# Patient Record
Sex: Male | Born: 1955
Health system: Southern US, Community
[De-identification: ages and names within clinical notes are randomized; demographics above are authoritative.]

## PROBLEM LIST (undated history)

## (undated) DIAGNOSIS — M1990 Unspecified osteoarthritis, unspecified site: Secondary | ICD-10-CM

## (undated) DIAGNOSIS — T59811A Toxic effect of smoke, accidental (unintentional), initial encounter: Secondary | ICD-10-CM

## (undated) DIAGNOSIS — I10 Essential (primary) hypertension: Secondary | ICD-10-CM

## (undated) HISTORY — DX: Essential (primary) hypertension: I10

## (undated) HISTORY — PX: TONSILLECTOMY: SUR1361

## (undated) HISTORY — DX: Hemochromatosis, unspecified: E83.119

## (undated) HISTORY — DX: Toxic effect of smoke, accidental (unintentional), initial encounter: T59.811A

## (undated) HISTORY — PX: BACK SURGERY: SHX140

## (undated) HISTORY — PX: OTHER SURGICAL HISTORY: SHX169

---

## 1998-06-06 ENCOUNTER — Encounter: Payer: Self-pay | Admitting: Family Medicine

## 1998-06-06 ENCOUNTER — Ambulatory Visit (HOSPITAL_COMMUNITY): Admission: RE | Admit: 1998-06-06 | Discharge: 1998-06-06 | Payer: Self-pay | Admitting: Family Medicine

## 1998-07-03 ENCOUNTER — Encounter: Payer: Self-pay | Admitting: Family Medicine

## 1998-07-03 ENCOUNTER — Ambulatory Visit: Admission: RE | Admit: 1998-07-03 | Discharge: 1998-07-03 | Payer: Self-pay | Admitting: Family Medicine

## 2005-10-16 DIAGNOSIS — F329 Major depressive disorder, single episode, unspecified: Secondary | ICD-10-CM | POA: Insufficient documentation

## 2005-10-16 DIAGNOSIS — F32A Depression, unspecified: Secondary | ICD-10-CM | POA: Insufficient documentation

## 2006-03-01 DIAGNOSIS — G459 Transient cerebral ischemic attack, unspecified: Secondary | ICD-10-CM | POA: Insufficient documentation

## 2006-03-01 DIAGNOSIS — E785 Hyperlipidemia, unspecified: Secondary | ICD-10-CM | POA: Insufficient documentation

## 2006-03-01 DIAGNOSIS — F17200 Nicotine dependence, unspecified, uncomplicated: Secondary | ICD-10-CM | POA: Insufficient documentation

## 2010-09-08 DIAGNOSIS — F419 Anxiety disorder, unspecified: Secondary | ICD-10-CM | POA: Insufficient documentation

## 2010-09-08 DIAGNOSIS — R000 Tachycardia, unspecified: Secondary | ICD-10-CM | POA: Insufficient documentation

## 2010-09-18 DIAGNOSIS — IMO0002 Reserved for concepts with insufficient information to code with codable children: Secondary | ICD-10-CM | POA: Insufficient documentation

## 2010-11-18 ENCOUNTER — Encounter: Payer: Self-pay | Admitting: Oncology

## 2010-11-19 ENCOUNTER — Other Ambulatory Visit: Payer: Self-pay | Admitting: Oncology

## 2010-11-19 ENCOUNTER — Encounter (HOSPITAL_BASED_OUTPATIENT_CLINIC_OR_DEPARTMENT_OTHER): Payer: Medicare HMO | Admitting: Oncology

## 2010-11-19 DIAGNOSIS — F1010 Alcohol abuse, uncomplicated: Secondary | ICD-10-CM

## 2010-11-19 LAB — CBC WITH DIFFERENTIAL/PLATELET
BASO%: 0.3 % (ref 0.0–2.0)
Basophils Absolute: 0 10*3/uL (ref 0.0–0.1)
EOS%: 1.2 % (ref 0.0–7.0)
Eosinophils Absolute: 0.1 10*3/uL (ref 0.0–0.5)
HCT: 42 % (ref 38.4–49.9)
HGB: 14.5 g/dL (ref 13.0–17.1)
LYMPH%: 20.2 % (ref 14.0–49.0)
MCH: 33.8 pg — ABNORMAL HIGH (ref 27.2–33.4)
MCHC: 34.6 g/dL (ref 32.0–36.0)
MCV: 97.6 fL (ref 79.3–98.0)
MONO#: 0.7 10*3/uL (ref 0.1–0.9)
MONO%: 8.7 % (ref 0.0–14.0)
NEUT#: 5.5 10*3/uL (ref 1.5–6.5)
NEUT%: 69.6 % (ref 39.0–75.0)
Platelets: 278 10*3/uL (ref 140–400)
RBC: 4.3 10*6/uL (ref 4.20–5.82)
RDW: 13.3 % (ref 11.0–14.6)
WBC: 8 10*3/uL (ref 4.0–10.3)
lymph#: 1.6 10*3/uL (ref 0.9–3.3)

## 2010-11-20 LAB — COMPREHENSIVE METABOLIC PANEL
AST: 28 U/L (ref 0–37)
Albumin: 4.5 g/dL (ref 3.5–5.2)
Alkaline Phosphatase: 53 U/L (ref 39–117)
BUN: 13 mg/dL (ref 6–23)
Potassium: 4.8 mEq/L (ref 3.5–5.3)

## 2010-11-20 LAB — IRON AND TIBC
%SAT: 30 % (ref 20–55)
TIBC: 345 ug/dL (ref 215–435)
UIBC: 242 ug/dL (ref 125–400)

## 2010-11-20 LAB — ERYTHROPOIETIN: Erythropoietin: 8.6 m[IU]/mL (ref 2.6–34.0)

## 2011-02-28 ENCOUNTER — Telehealth: Payer: Self-pay | Admitting: Oncology

## 2011-02-28 NOTE — Telephone Encounter (Signed)
lmonvm for pt re appts for 2/1 and 2/8. Feb schedule mailed today.

## 2011-03-30 ENCOUNTER — Encounter: Payer: Self-pay | Admitting: *Deleted

## 2011-04-03 ENCOUNTER — Other Ambulatory Visit: Payer: Medicare HMO

## 2011-04-10 ENCOUNTER — Ambulatory Visit: Payer: Medicare HMO | Admitting: Oncology

## 2011-11-17 DIAGNOSIS — G4730 Sleep apnea, unspecified: Secondary | ICD-10-CM | POA: Insufficient documentation

## 2011-12-22 DIAGNOSIS — R519 Headache, unspecified: Secondary | ICD-10-CM | POA: Insufficient documentation

## 2013-05-14 DIAGNOSIS — M549 Dorsalgia, unspecified: Secondary | ICD-10-CM

## 2013-05-14 DIAGNOSIS — I10 Essential (primary) hypertension: Secondary | ICD-10-CM | POA: Insufficient documentation

## 2013-05-14 DIAGNOSIS — G8929 Other chronic pain: Secondary | ICD-10-CM | POA: Insufficient documentation

## 2014-02-02 DIAGNOSIS — M79605 Pain in left leg: Secondary | ICD-10-CM | POA: Insufficient documentation

## 2014-02-02 DIAGNOSIS — Z8612 Personal history of poliomyelitis: Secondary | ICD-10-CM | POA: Insufficient documentation

## 2014-02-04 DIAGNOSIS — M62561 Muscle wasting and atrophy, not elsewhere classified, right lower leg: Secondary | ICD-10-CM | POA: Insufficient documentation

## 2016-10-07 ENCOUNTER — Other Ambulatory Visit: Payer: Self-pay | Admitting: Physician Assistant

## 2016-10-08 ENCOUNTER — Other Ambulatory Visit: Payer: Self-pay | Admitting: Physician Assistant

## 2016-10-08 DIAGNOSIS — M5416 Radiculopathy, lumbar region: Secondary | ICD-10-CM

## 2016-10-15 ENCOUNTER — Ambulatory Visit
Admission: RE | Admit: 2016-10-15 | Discharge: 2016-10-15 | Disposition: A | Discharge status: Home / Self Care | Payer: Medicare HMO | Source: Ambulatory Visit | Attending: Physician Assistant | Admitting: Physician Assistant

## 2016-10-15 ENCOUNTER — Ambulatory Visit
Admission: RE | Admit: 2016-10-15 | Discharge: 2016-10-15 | Disposition: A | Discharge status: Home / Self Care | Payer: 59 | Source: Ambulatory Visit | Attending: Physician Assistant | Admitting: Physician Assistant

## 2016-10-15 DIAGNOSIS — M5416 Radiculopathy, lumbar region: Secondary | ICD-10-CM

## 2016-10-15 MED ORDER — GADOBENATE DIMEGLUMINE 529 MG/ML IV SOLN
18.0000 mL | Freq: Once | INTRAVENOUS | Status: AC | PRN
  Administered 2016-10-15: 18 mL via INTRAVENOUS

## 2016-10-22 ENCOUNTER — Other Ambulatory Visit: Payer: Self-pay | Admitting: Neurological Surgery

## 2016-11-23 NOTE — Pre-Procedure Instructions (Signed)
Victor Ramirez  11/23/2016      [facility] - [place], [place] - [address] AT [facility] [address] [place] [address] Phone: [phone] Fax: [phone]    Your procedure is scheduled on September 28  Report to [facility] Admitting at 0530 A.M.  Call this number if you have problems the morning of surgery:  [phone]   Remember:  Do not eat food or drink liquids after midnight.  Continue all other medications as directed by your physician except follow these medication instructions before surgery   Take these medicines the morning of surgery with A SIP OF WATER  cetirizine (ZYRTEC) fluticasone (FLONASE) HYDROcodone-acetaminophen (NORCO/VICODIN) if needed  7 days prior to surgery STOP taking any Aspirin, Aleve, Naproxen, Ibuprofen, Motrin, Advil, Goody's, BC's, all herbal medications, fish oil, and all vitamins    Do not wear jewelry  Do not wear lotions, powders, or cologne, or deoderant.  Men may shave face and neck.  Do not bring valuables to the hospital.  [facility] is not responsible for any belongings or valuables.  Contacts, dentures or bridgework may not be worn into surgery.  Leave your suitcase in the car.  After surgery it may be brought to your room.  For patients admitted to the hospital, discharge time will be determined by your treatment team.  Patients discharged the day of surgery will not be allowed to drive home.    Special instructions:   [place]- Preparing For Surgery  Before surgery, you can play an important role. Because skin is not sterile, your skin needs to be as free of germs as possible. You can reduce the number of germs on your skin by washing with CHG (chlorahexidine gluconate) Soap before surgery.  CHG is an antiseptic cleaner which kills germs and bonds with the skin to continue killing germs even after washing.  Please do not use if you have an allergy  to CHG or antibacterial soaps. If your skin becomes reddened/irritated stop using the CHG.  Do not shave (including legs and underarms) for at least 48 hours prior to first CHG shower. It is OK to shave your face.  Please follow these instructions carefully.   1. Shower the NIGHT BEFORE SURGERY and the MORNING OF SURGERY with CHG.   2. If you chose to wash your hair, wash your hair first as usual with your normal shampoo.  3. After you shampoo, rinse your hair and body thoroughly to remove the shampoo.  4. Use CHG as you would any other liquid soap. You can apply CHG directly to the skin and wash gently with a scrungie or a clean washcloth.   5. Apply the CHG Soap to your body ONLY FROM THE NECK DOWN.  Do not use on open wounds or open sores. Avoid contact with your eyes, ears, mouth and genitals (private parts). Wash genitals (private parts) with your normal soap.  6. Wash thoroughly, paying special attention to the area where your surgery will be performed.  7. Thoroughly rinse your body with warm water from the neck down.  8. DO NOT shower/wash with your normal soap after using and rinsing off the CHG Soap.  9. Pat yourself dry with a CLEAN TOWEL.   10. Wear CLEAN PAJAMAS   11. Place CLEAN SHEETS on your bed the night of your first shower and DO NOT SLEEP WITH PETS.  Day of Surgery: Do not apply any deodorants/lotions. Please wear clean clothes to the hospital/surgery center.      Please read over the following fact sheets that you were given.

## 2016-11-24 ENCOUNTER — Encounter (HOSPITAL_COMMUNITY)
Admission: RE | Admit: 2016-11-24 | Discharge: 2016-11-24 | Disposition: A | Discharge status: Home / Self Care | Payer: 59 | Source: Ambulatory Visit | Attending: Neurological Surgery | Admitting: Neurological Surgery

## 2016-11-24 ENCOUNTER — Encounter (HOSPITAL_COMMUNITY): Payer: Self-pay

## 2016-11-24 DIAGNOSIS — Z01812 Encounter for preprocedural laboratory examination: Secondary | ICD-10-CM | POA: Insufficient documentation

## 2016-11-24 DIAGNOSIS — I10 Essential (primary) hypertension: Secondary | ICD-10-CM | POA: Insufficient documentation

## 2016-11-24 DIAGNOSIS — M4726 Other spondylosis with radiculopathy, lumbar region: Secondary | ICD-10-CM | POA: Insufficient documentation

## 2016-11-24 DIAGNOSIS — Z0183 Encounter for blood typing: Secondary | ICD-10-CM

## 2016-11-24 DIAGNOSIS — Z01818 Encounter for other preprocedural examination: Secondary | ICD-10-CM | POA: Insufficient documentation

## 2016-11-24 LAB — BASIC METABOLIC PANEL
Anion gap: 7 (ref 5–15)
BUN: 13 mg/dL (ref 6–20)
CHLORIDE: 100 mmol/L — AB (ref 101–111)
CO2: 27 mmol/L (ref 22–32)
CREATININE: 0.75 mg/dL (ref 0.61–1.24)
Calcium: 9.5 mg/dL (ref 8.9–10.3)
GFR calc non Af Amer: >60 mL/min (ref >60)
GLUCOSE: 99 mg/dL (ref 65–99)
Potassium: 5 mmol/L (ref 3.5–5.1)
Sodium: 134 mmol/L — ABNORMAL LOW (ref 135–145)

## 2016-11-24 LAB — TYPE AND SCREEN
ABO/RH(D): B POS
Antibody Screen: NEGATIVE

## 2016-11-24 LAB — CBC
HCT: 44.5 % (ref 39.0–52.0)
Hemoglobin: 15.3 g/dL (ref 13.0–17.0)
MCH: 32.6 pg (ref 26.0–34.0)
MCHC: 34.4 g/dL (ref 30.0–36.0)
MCV: 94.9 fL (ref 78.0–100.0)
PLATELETS: 266 10*3/uL (ref 150–400)
RBC: 4.69 MIL/uL (ref 4.22–5.81)
RDW: 12.6 % (ref 11.5–15.5)
WBC: 8.6 10*3/uL (ref 4.0–10.5)

## 2016-11-24 LAB — ABO/RH: ABO/RH(D): B POS

## 2016-11-24 LAB — SURGICAL PCR SCREEN
MRSA, PCR: NEGATIVE
Staphylococcus aureus: NEGATIVE

## 2016-11-24 MED ORDER — CHLORHEXIDINE GLUCONATE CLOTH 2 % EX PADS: 6.0000 | MEDICATED_PAD | Freq: Once | CUTANEOUS | Status: DC

## 2016-11-24 NOTE — Progress Notes (Signed)
PCP - Horton Marshall Cardiologist - denies   EKG - 11/24/2016  Pt denies cardiac history. Reports that he had retinal bleed over 10 years ago and had possible stress test done. Pt reports all tests were normal that were done.  Patient denies shortness of breath, fever, cough and chest pain at PAT appointment   Patient verbalized understanding of instructions that were given to them at the PAT appointment. Patient was also instructed that they will need to review over the PAT instructions again at home before surgery.

## 2016-11-24 NOTE — Progress Notes (Signed)
   11/24/16 0830  OBSTRUCTIVE SLEEP APNEA  Have you ever been diagnosed with sleep apnea through a sleep study? No  Do you snore loudly (loud enough to be heard through closed doors)?  1  Do you often feel tired, fatigued, or sleepy during the daytime (such as falling asleep during driving or talking to someone)? 0  Has anyone observed you stop breathing during your sleep? 0  Do you have, or are you being treated for high blood pressure? 1  BMI more than 35 kg/m2? 0  Age > 50 (1-yes) 1  Neck circumference greater than:Male 16 inches or larger, Male 17inches or larger? 1  Male Gender (Yes=1) 1  Obstructive Sleep Apnea Score 5  Score 5 or greater  Results sent to PCP

## 2016-11-27 ENCOUNTER — Inpatient Hospital Stay (HOSPITAL_COMMUNITY)
Admission: RE | Admit: 2016-11-27 | Discharge: 2016-11-28 | DRG: 455 | Disposition: A | Discharge status: Home / Self Care | Payer: 59 | Source: Ambulatory Visit | Attending: Neurological Surgery | Admitting: Neurological Surgery

## 2016-11-27 ENCOUNTER — Inpatient Hospital Stay (HOSPITAL_COMMUNITY): Payer: 59 | Admitting: Certified Registered"

## 2016-11-27 ENCOUNTER — Encounter (HOSPITAL_COMMUNITY): Payer: Self-pay

## 2016-11-27 ENCOUNTER — Encounter (HOSPITAL_COMMUNITY)
Admission: RE | Disposition: A | Discharge status: Home / Self Care | Payer: Self-pay | Source: Ambulatory Visit | Attending: Neurological Surgery

## 2016-11-27 ENCOUNTER — Inpatient Hospital Stay (HOSPITAL_COMMUNITY): Payer: 59

## 2016-11-27 DIAGNOSIS — M4727 Other spondylosis with radiculopathy, lumbosacral region: Principal | ICD-10-CM | POA: Diagnosis present

## 2016-11-27 DIAGNOSIS — Z419 Encounter for procedure for purposes other than remedying health state, unspecified: Secondary | ICD-10-CM

## 2016-11-27 DIAGNOSIS — Z981 Arthrodesis status: Secondary | ICD-10-CM | POA: Diagnosis not present

## 2016-11-27 DIAGNOSIS — Z79899 Other long term (current) drug therapy: Secondary | ICD-10-CM

## 2016-11-27 DIAGNOSIS — M48061 Spinal stenosis, lumbar region without neurogenic claudication: Secondary | ICD-10-CM | POA: Diagnosis present

## 2016-11-27 DIAGNOSIS — Z8612 Personal history of poliomyelitis: Secondary | ICD-10-CM | POA: Diagnosis not present

## 2016-11-27 DIAGNOSIS — I10 Essential (primary) hypertension: Secondary | ICD-10-CM | POA: Diagnosis present

## 2016-11-27 DIAGNOSIS — Z7951 Long term (current) use of inhaled steroids: Secondary | ICD-10-CM

## 2016-11-27 DIAGNOSIS — Z87891 Personal history of nicotine dependence: Secondary | ICD-10-CM

## 2016-11-27 HISTORY — PX: APPLICATION OF ROBOTIC ASSISTANCE FOR SPINAL PROCEDURE: SHX6753

## 2016-11-27 HISTORY — PX: ANTERIOR LATERAL LUMBAR FUSION WITH PERCUTANEOUS SCREW 1 LEVEL: SHX5553

## 2016-11-27 SURGERY — ANTERIOR LATERAL LUMBAR FUSION WITH PERCUTANEOUS SCREW 1 LEVEL
Anesthesia: General | Site: Spine Lumbar | Laterality: Right

## 2016-11-27 MED ORDER — THROMBIN 20000 UNITS EX SOLR
CUTANEOUS | Status: AC
  Filled 2016-11-27: qty 20000

## 2016-11-27 MED ORDER — CEFAZOLIN SODIUM-DEXTROSE 1-4 GM/50ML-% IV SOLN
1.0000 g | Freq: Three times a day (TID) | INTRAVENOUS | Status: AC
  Administered 2016-11-27 – 2016-11-28 (×2): 1 g via INTRAVENOUS
  Filled 2016-11-27 (×2): qty 50

## 2016-11-27 MED ORDER — OXYCODONE HCL 5 MG PO TABS
5.0000 mg | ORAL_TABLET | ORAL | Status: DC | PRN
  Administered 2016-11-27 – 2016-11-28 (×5): 10 mg via ORAL
  Filled 2016-11-27 (×4): qty 2

## 2016-11-27 MED ORDER — SUCCINYLCHOLINE CHLORIDE 20 MG/ML IJ SOLN
INTRAMUSCULAR | Status: DC | PRN
  Administered 2016-11-27: 140 mg via INTRAVENOUS

## 2016-11-27 MED ORDER — MIDAZOLAM HCL 5 MG/5ML IJ SOLN
INTRAMUSCULAR | Status: DC | PRN
  Administered 2016-11-27: 2 mg via INTRAVENOUS

## 2016-11-27 MED ORDER — MENTHOL 3 MG MT LOZG: 1.0000 | LOZENGE | OROMUCOSAL | Status: DC | PRN

## 2016-11-27 MED ORDER — METHOCARBAMOL 500 MG PO TABS
ORAL_TABLET | ORAL | Status: AC
  Administered 2016-11-27: 750 mg via ORAL
  Filled 2016-11-27: qty 2

## 2016-11-27 MED ORDER — LIDOCAINE-EPINEPHRINE 2 %-1:100000 IJ SOLN
INTRAMUSCULAR | Status: DC | PRN
  Administered 2016-11-27: 12.5 mL
  Administered 2016-11-27: 2.5 mL

## 2016-11-27 MED ORDER — SODIUM CHLORIDE 0.9 % IV SOLN
INTRAVENOUS | Status: DC
  Administered 2016-11-27: 16:00:00 via INTRAVENOUS

## 2016-11-27 MED ORDER — CELECOXIB 200 MG PO CAPS
200.0000 mg | ORAL_CAPSULE | Freq: Two times a day (BID) | ORAL | Status: DC
  Administered 2016-11-27 – 2016-11-28 (×2): 200 mg via ORAL
  Filled 2016-11-27 (×2): qty 1

## 2016-11-27 MED ORDER — METHYLPREDNISOLONE ACETATE 80 MG/ML IJ SUSP
INTRAMUSCULAR | Status: AC
  Filled 2016-11-27: qty 1

## 2016-11-27 MED ORDER — PANTOPRAZOLE SODIUM 40 MG IV SOLR
40.0000 mg | Freq: Every day | INTRAVENOUS | Status: DC
  Administered 2016-11-27: 40 mg via INTRAVENOUS
  Filled 2016-11-27: qty 40

## 2016-11-27 MED ORDER — GABAPENTIN 300 MG PO CAPS
300.0000 mg | ORAL_CAPSULE | Freq: Three times a day (TID) | ORAL | Status: DC
  Administered 2016-11-27 – 2016-11-28 (×3): 300 mg via ORAL
  Filled 2016-11-27 (×3): qty 1

## 2016-11-27 MED ORDER — CEFAZOLIN SODIUM 1 G IJ SOLR
INTRAMUSCULAR | Status: AC
  Filled 2016-11-27: qty 20

## 2016-11-27 MED ORDER — PHENYLEPHRINE 40 MCG/ML (10ML) SYRINGE FOR IV PUSH (FOR BLOOD PRESSURE SUPPORT)
PREFILLED_SYRINGE | INTRAVENOUS | Status: AC
  Filled 2016-11-27: qty 10

## 2016-11-27 MED ORDER — PROPOFOL 10 MG/ML IV BOLUS
INTRAVENOUS | Status: AC
  Filled 2016-11-27: qty 20

## 2016-11-27 MED ORDER — 0.9 % SODIUM CHLORIDE (POUR BTL) OPTIME
TOPICAL | Status: DC | PRN
  Administered 2016-11-27: 1000 mL

## 2016-11-27 MED ORDER — BUPIVACAINE HCL (PF) 0.5 % IJ SOLN
INTRAMUSCULAR | Status: AC
  Filled 2016-11-27: qty 30

## 2016-11-27 MED ORDER — SODIUM CHLORIDE 0.9% FLUSH
3.0000 mL | Freq: Two times a day (BID) | INTRAVENOUS | Status: DC
  Administered 2016-11-27: 3 mL via INTRAVENOUS

## 2016-11-27 MED ORDER — PHENYLEPHRINE HCL 10 MG/ML IJ SOLN
INTRAMUSCULAR | Status: DC | PRN
  Administered 2016-11-27: 160 ug via INTRAVENOUS

## 2016-11-27 MED ORDER — LIDOCAINE-EPINEPHRINE (PF) 2 %-1:200000 IJ SOLN
INTRAMUSCULAR | Status: AC
  Filled 2016-11-27: qty 20

## 2016-11-27 MED ORDER — VANCOMYCIN HCL 1000 MG IV SOLR
INTRAVENOUS | Status: DC | PRN
  Administered 2016-11-27: 1000 mg via TOPICAL

## 2016-11-27 MED ORDER — KETOROLAC TROMETHAMINE 30 MG/ML IJ SOLN
INTRAMUSCULAR | Status: AC
  Filled 2016-11-27: qty 1

## 2016-11-27 MED ORDER — VANCOMYCIN HCL 1000 MG IV SOLR
INTRAVENOUS | Status: AC
  Filled 2016-11-27: qty 1000

## 2016-11-27 MED ORDER — ONDANSETRON HCL 4 MG PO TABS: 4.0000 mg | ORAL_TABLET | Freq: Four times a day (QID) | ORAL | Status: DC | PRN

## 2016-11-27 MED ORDER — THROMBIN 20000 UNITS EX SOLR
CUTANEOUS | Status: DC | PRN
  Administered 2016-11-27: 20 mL via TOPICAL

## 2016-11-27 MED ORDER — SODIUM CHLORIDE 0.9% FLUSH: 3.0000 mL | INTRAVENOUS | Status: DC | PRN

## 2016-11-27 MED ORDER — CEFAZOLIN SODIUM-DEXTROSE 2-4 GM/100ML-% IV SOLN
2.0000 g | INTRAVENOUS | Status: AC
  Administered 2016-11-27 (×2): 2 g via INTRAVENOUS
  Filled 2016-11-27: qty 100

## 2016-11-27 MED ORDER — ONDANSETRON HCL 4 MG/2ML IJ SOLN
4.0000 mg | Freq: Once | INTRAMUSCULAR | Status: AC | PRN
  Administered 2016-11-27: 4 mg via INTRAVENOUS

## 2016-11-27 MED ORDER — MIDAZOLAM HCL 2 MG/2ML IJ SOLN
INTRAMUSCULAR | Status: AC
  Filled 2016-11-27: qty 2

## 2016-11-27 MED ORDER — PHENYLEPHRINE HCL 10 MG/ML IJ SOLN
INTRAMUSCULAR | Status: DC | PRN
  Administered 2016-11-27: 30 ug/min via INTRAVENOUS

## 2016-11-27 MED ORDER — ZOLPIDEM TARTRATE 5 MG PO TABS: 5.0000 mg | ORAL_TABLET | Freq: Every evening | ORAL | Status: DC | PRN

## 2016-11-27 MED ORDER — ACETAMINOPHEN 500 MG PO TABS
1000.0000 mg | ORAL_TABLET | Freq: Four times a day (QID) | ORAL | Status: DC
  Administered 2016-11-27 – 2016-11-28 (×3): 1000 mg via ORAL
  Filled 2016-11-27 (×3): qty 2

## 2016-11-27 MED ORDER — ONDANSETRON HCL 4 MG/2ML IJ SOLN: 4.0000 mg | Freq: Four times a day (QID) | INTRAMUSCULAR | Status: DC | PRN

## 2016-11-27 MED ORDER — METHOCARBAMOL 750 MG PO TABS
750.0000 mg | ORAL_TABLET | Freq: Four times a day (QID) | ORAL | Status: DC
  Administered 2016-11-27 – 2016-11-28 (×3): 750 mg via ORAL
  Filled 2016-11-27 (×2): qty 1

## 2016-11-27 MED ORDER — ONDANSETRON HCL 4 MG/2ML IJ SOLN
INTRAMUSCULAR | Status: DC | PRN
  Administered 2016-11-27: 4 mg via INTRAVENOUS

## 2016-11-27 MED ORDER — LORATADINE 10 MG PO TABS
10.0000 mg | ORAL_TABLET | Freq: Every day | ORAL | Status: DC
  Administered 2016-11-28: 10 mg via ORAL
  Filled 2016-11-27: qty 1

## 2016-11-27 MED ORDER — SODIUM CHLORIDE 0.9 % IJ SOLN
INTRAMUSCULAR | Status: AC
  Filled 2016-11-27: qty 20

## 2016-11-27 MED ORDER — BUPIVACAINE LIPOSOME 1.3 % IJ SUSP
20.0000 mL | Freq: Once | INTRAMUSCULAR | Status: DC
  Filled 2016-11-27: qty 20

## 2016-11-27 MED ORDER — PROPOFOL 10 MG/ML IV BOLUS
INTRAVENOUS | Status: DC | PRN
  Administered 2016-11-27 (×2): 70 mg via INTRAVENOUS
  Administered 2016-11-27 (×2): 130 mg via INTRAVENOUS

## 2016-11-27 MED ORDER — METHYLPREDNISOLONE ACETATE 80 MG/ML IJ SUSP
INTRAMUSCULAR | Status: DC | PRN
  Administered 2016-11-27: 1 mL

## 2016-11-27 MED ORDER — FENTANYL CITRATE (PF) 100 MCG/2ML IJ SOLN
INTRAMUSCULAR | Status: DC | PRN
  Administered 2016-11-27 (×2): 50 ug via INTRAVENOUS
  Administered 2016-11-27: 100 ug via INTRAVENOUS
  Administered 2016-11-27: 150 ug via INTRAVENOUS
  Administered 2016-11-27 (×4): 100 ug via INTRAVENOUS

## 2016-11-27 MED ORDER — SODIUM CHLORIDE 0.9 % IR SOLN
Status: DC | PRN
  Administered 2016-11-27: 10:00:00

## 2016-11-27 MED ORDER — BUPIVACAINE-EPINEPHRINE 0.5% -1:200000 IJ SOLN
INTRAMUSCULAR | Status: DC | PRN
  Administered 2016-11-27: 2.5 mL
  Administered 2016-11-27: 12.5 mL

## 2016-11-27 MED ORDER — LIDOCAINE HCL (CARDIAC) 20 MG/ML IV SOLN
INTRAVENOUS | Status: DC | PRN
  Administered 2016-11-27: 100 mg via INTRAVENOUS

## 2016-11-27 MED ORDER — SUGAMMADEX SODIUM 200 MG/2ML IV SOLN
INTRAVENOUS | Status: DC | PRN
  Administered 2016-11-27: 178.6 mg via INTRAVENOUS

## 2016-11-27 MED ORDER — ROCURONIUM BROMIDE 10 MG/ML (PF) SYRINGE
PREFILLED_SYRINGE | INTRAVENOUS | Status: AC
  Filled 2016-11-27: qty 5

## 2016-11-27 MED ORDER — KETOROLAC TROMETHAMINE 30 MG/ML IJ SOLN
INTRAMUSCULAR | Status: DC | PRN
  Administered 2016-11-27: 30 mg via INTRA_ARTICULAR

## 2016-11-27 MED ORDER — HYDROXYZINE HCL 50 MG/ML IM SOLN
50.0000 mg | INTRAMUSCULAR | Status: DC | PRN
  Administered 2016-11-27: 50 mg via INTRAMUSCULAR
  Filled 2016-11-27: qty 1

## 2016-11-27 MED ORDER — FENTANYL CITRATE (PF) 250 MCG/5ML IJ SOLN
INTRAMUSCULAR | Status: AC
  Filled 2016-11-27: qty 5

## 2016-11-27 MED ORDER — OXYCODONE HCL ER 20 MG PO T12A
20.0000 mg | EXTENDED_RELEASE_TABLET | Freq: Two times a day (BID) | ORAL | Status: DC
  Administered 2016-11-27 – 2016-11-28 (×3): 20 mg via ORAL
  Filled 2016-11-27 (×3): qty 1

## 2016-11-27 MED ORDER — DIAZEPAM 5 MG PO TABS
ORAL_TABLET | ORAL | Status: AC
  Filled 2016-11-27: qty 1

## 2016-11-27 MED ORDER — BISACODYL 5 MG PO TBEC: 5.0000 mg | DELAYED_RELEASE_TABLET | Freq: Every day | ORAL | Status: DC | PRN

## 2016-11-27 MED ORDER — FLEET ENEMA 7-19 GM/118ML RE ENEM: 1.0000 | ENEMA | Freq: Once | RECTAL | Status: DC | PRN

## 2016-11-27 MED ORDER — OXYCODONE HCL 5 MG PO TABS
ORAL_TABLET | ORAL | Status: AC
Start: 2016-11-27 — End: 2016-11-28
  Filled 2016-11-27: qty 2

## 2016-11-27 MED ORDER — FLUTICASONE PROPIONATE 50 MCG/ACT NA SUSP
2.0000 | Freq: Every day | NASAL | Status: DC
  Filled 2016-11-27: qty 16

## 2016-11-27 MED ORDER — HYDROMORPHONE HCL 1 MG/ML IJ SOLN
0.2500 mg | INTRAMUSCULAR | Status: DC | PRN
  Administered 2016-11-27 (×2): 0.5 mg via INTRAVENOUS

## 2016-11-27 MED ORDER — ONDANSETRON HCL 4 MG/2ML IJ SOLN
INTRAMUSCULAR | Status: AC
  Filled 2016-11-27: qty 2

## 2016-11-27 MED ORDER — ROCURONIUM BROMIDE 100 MG/10ML IV SOLN
INTRAVENOUS | Status: DC | PRN
  Administered 2016-11-27: 100 mg via INTRAVENOUS

## 2016-11-27 MED ORDER — DOCUSATE SODIUM 100 MG PO CAPS
100.0000 mg | ORAL_CAPSULE | Freq: Two times a day (BID) | ORAL | Status: DC
  Administered 2016-11-27 – 2016-11-28 (×2): 100 mg via ORAL
  Filled 2016-11-27 (×2): qty 1

## 2016-11-27 MED ORDER — THROMBIN 5000 UNITS EX SOLR
OROMUCOSAL | Status: DC | PRN
  Administered 2016-11-27: 10:00:00 via TOPICAL

## 2016-11-27 MED ORDER — MEPERIDINE HCL 25 MG/ML IJ SOLN: 6.2500 mg | INTRAMUSCULAR | Status: DC | PRN

## 2016-11-27 MED ORDER — LACTATED RINGERS IV SOLN
INTRAVENOUS | Status: DC
  Administered 2016-11-27 (×2): via INTRAVENOUS

## 2016-11-27 MED ORDER — LIDOCAINE 2% (20 MG/ML) 5 ML SYRINGE
INTRAMUSCULAR | Status: AC
  Filled 2016-11-27: qty 5

## 2016-11-27 MED ORDER — BUPIVACAINE-EPINEPHRINE (PF) 0.5% -1:200000 IJ SOLN
INTRAMUSCULAR | Status: AC
  Filled 2016-11-27: qty 30

## 2016-11-27 MED ORDER — HYDROMORPHONE HCL 1 MG/ML IJ SOLN
INTRAMUSCULAR | Status: AC
  Administered 2016-11-27: 0.5 mg via INTRAVENOUS
  Filled 2016-11-27: qty 1

## 2016-11-27 MED ORDER — SODIUM CHLORIDE 0.9 % IV SOLN
INTRAVENOUS | Status: DC | PRN
  Administered 2016-11-27: 30 mL

## 2016-11-27 MED ORDER — PHENOL 1.4 % MT LIQD: 1.0000 | OROMUCOSAL | Status: DC | PRN

## 2016-11-27 MED ORDER — BUPIVACAINE HCL (PF) 0.5 % IJ SOLN
INTRAMUSCULAR | Status: DC | PRN
  Administered 2016-11-27: 1 mL
  Administered 2016-11-27: 2 mL

## 2016-11-27 MED ORDER — ONDANSETRON HCL 4 MG/2ML IJ SOLN
INTRAMUSCULAR | Status: AC
  Administered 2016-11-27: 4 mg via INTRAVENOUS
  Filled 2016-11-27: qty 2

## 2016-11-27 MED ORDER — DIAZEPAM 5 MG PO TABS
5.0000 mg | ORAL_TABLET | Freq: Four times a day (QID) | ORAL | Status: DC | PRN
  Administered 2016-11-27 (×2): 5 mg via ORAL
  Filled 2016-11-27: qty 1

## 2016-11-27 MED ORDER — THROMBIN 5000 UNITS EX SOLR
CUTANEOUS | Status: AC
  Filled 2016-11-27: qty 5000

## 2016-11-27 MED ORDER — SENNA 8.6 MG PO TABS
1.0000 | ORAL_TABLET | Freq: Two times a day (BID) | ORAL | Status: DC
  Administered 2016-11-27 – 2016-11-28 (×2): 8.6 mg via ORAL
  Filled 2016-11-27 (×2): qty 1

## 2016-11-27 MED ORDER — LISINOPRIL 20 MG PO TABS
40.0000 mg | ORAL_TABLET | Freq: Every day | ORAL | Status: DC
  Administered 2016-11-27 – 2016-11-28 (×2): 40 mg via ORAL
  Filled 2016-11-27 (×2): qty 2

## 2016-11-27 MED ORDER — SUGAMMADEX SODIUM 200 MG/2ML IV SOLN
INTRAVENOUS | Status: AC
  Filled 2016-11-27: qty 2

## 2016-11-27 SURGICAL SUPPLY — 83 items
ADH SKN CLS APL DERMABOND .7 (GAUZE/BANDAGES/DRESSINGS) ×6
BATTALION LLIF ITRADISCAL SHIM (MISCELLANEOUS) ×3
BIT DRILL LONG 3.0X30 (BIT) IMPLANT
BIT DRILL LONG 3X80 (BIT) IMPLANT
BIT DRILL LONG 4X80 (BIT) IMPLANT
BIT DRILL SHORT 3.0X30 (BIT) IMPLANT
BIT DRILL SHORT 3X80 (BIT) IMPLANT
BLADE SURG 11 STRL SS (BLADE) ×3 IMPLANT
CABLE BATTALION LLIF LIGHT (MISCELLANEOUS) ×1 IMPLANT
CHLORAPREP W/TINT 26ML (MISCELLANEOUS) ×3 IMPLANT
DERMABOND ADVANCED (GAUZE/BANDAGES/DRESSINGS) ×3
DERMABOND ADVANCED .7 DNX12 (GAUZE/BANDAGES/DRESSINGS) ×4 IMPLANT
DILATOR INSULATED XL 8X13 (MISCELLANEOUS) ×1 IMPLANT
DISSECTOR BLUNT TIP ENDO 5MM (MISCELLANEOUS) IMPLANT
DRAPE C-ARM 42X72 X-RAY (DRAPES) ×6 IMPLANT
DRAPE C-ARMOR (DRAPES) ×3 IMPLANT
DRAPE LAPAROTOMY 100X72X124 (DRAPES) ×3 IMPLANT
DRAPE POUCH INSTRU U-SHP 10X18 (DRAPES) ×3 IMPLANT
DRAPE SHEET LG 3/4 BI-LAMINATE (DRAPES) ×3 IMPLANT
DRSG OPSITE POSTOP 4X6 (GAUZE/BANDAGES/DRESSINGS) ×3 IMPLANT
ELECT BLADE 4.0 EZ CLEAN MEGAD (MISCELLANEOUS) ×3
ELECT COATED BLADE 2.86 ST (ELECTRODE) ×3 IMPLANT
ELECT REM PT RETURN 9FT ADLT (ELECTROSURGICAL) ×3
ELECTRODE BLDE 4.0 EZ CLN MEGD (MISCELLANEOUS) IMPLANT
ELECTRODE REM PT RTRN 9FT ADLT (ELECTROSURGICAL) ×2 IMPLANT
FEE INTRAOP MONITOR IMPULS NCS (MISCELLANEOUS) IMPLANT
GAUZE SPONGE 4X4 16PLY XRAY LF (GAUZE/BANDAGES/DRESSINGS) IMPLANT
GLOVE BIO SURGEON STRL SZ7 (GLOVE) IMPLANT
GLOVE BIOGEL PI IND STRL 7.0 (GLOVE) IMPLANT
GLOVE BIOGEL PI IND STRL 7.5 (GLOVE) ×2 IMPLANT
GLOVE BIOGEL PI INDICATOR 7.0 (GLOVE) ×2
GLOVE BIOGEL PI INDICATOR 7.5 (GLOVE) ×3
GLOVE EXAM NITRILE LRG STRL (GLOVE) IMPLANT
GLOVE EXAM NITRILE XL STR (GLOVE) IMPLANT
GLOVE EXAM NITRILE XS STR PU (GLOVE) IMPLANT
GLOVE SS BIOGEL STRL SZ 7.5 (GLOVE) ×2 IMPLANT
GLOVE SUPERSENSE BIOGEL SZ 7.5 (GLOVE) ×1
GOWN STRL REUS W/ TWL LRG LVL3 (GOWN DISPOSABLE) ×2 IMPLANT
GOWN STRL REUS W/ TWL XL LVL3 (GOWN DISPOSABLE) ×2 IMPLANT
GOWN STRL REUS W/TWL 2XL LVL3 (GOWN DISPOSABLE) ×1 IMPLANT
GOWN STRL REUS W/TWL LRG LVL3 (GOWN DISPOSABLE) ×3
GOWN STRL REUS W/TWL XL LVL3 (GOWN DISPOSABLE) ×3
GUIDEWIRE 320MM BLUNT TIP (WIRE) ×1 IMPLANT
HEMOSTAT POWDER KIT SURGIFOAM (HEMOSTASIS) IMPLANT
INTRAOP MONITOR FEE IMPULS NCS (MISCELLANEOUS) ×2
INTRAOP MONITOR FEE IMPULSE (MISCELLANEOUS) ×1
KIT BASIN OR (CUSTOM PROCEDURE TRAY) ×3 IMPLANT
KIT INFUSE XX SMALL 0.7CC (Orthopedic Implant) ×1 IMPLANT
KIT ROOM TURNOVER OR (KITS) ×3 IMPLANT
KIT SPINE MAZOR X ROBO DISP (MISCELLANEOUS) ×3 IMPLANT
LEAD WIRE MULTI STAGE DISP (NEUROSURGERY SUPPLIES) ×1 IMPLANT
NDL HYPO 21X1.5 SAFETY (NEEDLE) ×2 IMPLANT
NDL HYPO 25X1 1.5 SAFETY (NEEDLE) ×2 IMPLANT
NEEDLE HYPO 21X1.5 SAFETY (NEEDLE) ×6 IMPLANT
NEEDLE HYPO 25X1 1.5 SAFETY (NEEDLE) ×6 IMPLANT
NS IRRIG 1000ML POUR BTL (IV SOLUTION) ×3 IMPLANT
PACK LAMINECTOMY NEURO (CUSTOM PROCEDURE TRAY) ×3 IMPLANT
PIN HEAD 2.5X60MM (PIN) IMPLANT
PROBE BALL TIP STIM 200MM 2.3M (NEUROSURGERY SUPPLIES) ×2 IMPLANT
PUTTY DBM GRAFTON 5CC (Putty) ×1 IMPLANT
ROD TI PRECONT ILLICO 5.5X5 (Rod) ×2 IMPLANT
SCREW CANN PA ILLICO 6.5X50 (Screw) ×2 IMPLANT
SCREW CANN PA ILLICO 6.5X55 (Screw) ×2 IMPLANT
SCREW SCHANZ SA 4.0MM (MISCELLANEOUS) IMPLANT
SCREW SET SPINAL STD HEXALOBE (Screw) ×4 IMPLANT
SHIM ITRADISCAL BATTALION LLIF (MISCELLANEOUS) IMPLANT
SPACER BATTALION 22X10X55M 10 (Spacer) ×1 IMPLANT
SPONGE LAP 4X18 X RAY DECT (DISPOSABLE) IMPLANT
SPONGE SURGIFOAM ABS GEL 100 (HEMOSTASIS) ×1 IMPLANT
STAPLER VISISTAT 35W (STAPLE) ×3 IMPLANT
SUT STRATAFIX MNCRL+ 3-0 PS-2 (SUTURE) ×2
SUT STRATAFIX MONOCRYL 3-0 (SUTURE) ×1
SUT VIC AB 1 CT1 18XBRD ANBCTR (SUTURE) ×2 IMPLANT
SUT VIC AB 1 CT1 8-18 (SUTURE) ×3
SUT VIC AB 2-0 CT1 18 (SUTURE) ×3 IMPLANT
SUT VIC AB 3-0 SH 8-18 (SUTURE) ×2 IMPLANT
SUTURE STRATFX MNCRL+ 3-0 PS-2 (SUTURE) IMPLANT
SYR 30ML LL (SYRINGE) ×4 IMPLANT
TIP TROCAR NITINOL ILLICO 20 (INSTRUMENTS) ×4 IMPLANT
TOWEL GREEN STERILE (TOWEL DISPOSABLE) ×3 IMPLANT
TOWEL GREEN STERILE FF (TOWEL DISPOSABLE) ×3 IMPLANT
TRAY FOLEY W/METER SILVER 16FR (SET/KITS/TRAYS/PACK) ×3 IMPLANT
WATER STERILE IRR 1000ML POUR (IV SOLUTION) ×3 IMPLANT

## 2016-11-27 NOTE — Evaluation (Signed)
Physical Therapy Evaluation Patient Details Name: Victor Ramirez MRN: [id number] DOB: May 17, 1955 Today's Date: 11/27/2016   History of Present Illness  Pt is a 61 y/o male s/p L3-4 ALIF. PMH includes HTN, back surgery, and hemachromatosis.   Clinical Impression  Patient is s/p above surgery resulting in the deficits listed below (see PT Problem List). PTA, pt was independent with mobility, however, did not walk much secondary to pain. Upon eval, pt very limited by pain. Required min A for balance and very limited distance secondary to pain. Discussed use of RW at d/c and will need to practice during next session. Will need DME below and will have assist from wife at d/c. Patient will benefit from skilled PT to increase their independence and safety with mobility (while adhering to their precautions) to allow discharge to the venue listed below.     Follow Up Recommendations No PT follow up;Supervision/Assistance - 24 hour    Equipment Recommendations  Rolling walker with 5" wheels;3in1 (PT)    Recommendations for Other Services       Precautions / Restrictions Precautions Precautions: Back Precaution Booklet Issued: Yes (comment) Precaution Comments: Reviewed back precaution handout with pt.  Required Braces or Orthoses: Spinal Brace Spinal Brace: Lumbar corset;Applied in sitting position Restrictions Weight Bearing Restrictions: No      Mobility  Bed Mobility Overal bed mobility: Needs Assistance Bed Mobility: Sit to Sidelying;Rolling Rolling: Supervision       Sit to sidelying: Supervision General bed mobility comments: Supervision to ensure log roll tehcnique.   Transfers Overall transfer level: Needs assistance Equipment used: 1 person hand held assist Transfers: Sit to/from Stand Sit to Stand: Min assist         General transfer comment: Min A for steadying assist secondary to LOB upon standing.   Ambulation/Gait Ambulation/Gait assistance: Min assist;Min  guard Ambulation Distance (Feet): 75 Feet Assistive device: 1 person hand held assist (IV pole ) Gait Pattern/deviations: Step-through pattern;Decreased stride length;Shuffle;Trunk flexed Gait velocity: Decreased Gait velocity interpretation: Below normal speed for age/gender General Gait Details: Slow, very guarded gait. Pt requiring use of BUE support to maintain balance. Distance limited by increased pain. Required verbal cues for breathing as pt wanted to hold his breath.   Stairs            Wheelchair Mobility    Modified Rankin (Stroke Patients Only)       Balance Overall balance assessment: Needs assistance Sitting-balance support: No upper extremity supported;Feet supported Sitting balance-Leahy Scale: Good     Standing balance support: Bilateral upper extremity supported;During functional activity Standing balance-Leahy Scale: Poor Standing balance comment: Reliant on BUE support for balance                              Pertinent Vitals/Pain Pain Assessment: 0-10 Pain Score: 8  Pain Location: back  Pain Descriptors / Indicators: Cramping;Spasm Pain Intervention(s): Limited activity within patient's tolerance;Monitored during session;Repositioned    Home Living Family/patient expects to be discharged to:: Private residence Living Arrangements: Spouse/significant other;Children Available Help at Discharge: Family;Available 24 hours/day Type of Home: House Home Access: Stairs to enter Entrance Stairs-Rails: Right Entrance Stairs-Number of Steps: 3 Home Layout: Two level;Able to live on main level with bedroom/bathroom Home Equipment: None      Prior Function Level of Independence: Independent         Comments: Reports he was independent, but didn't walk much  Hand Dominance        Extremity/Trunk Assessment   Upper Extremity Assessment Upper Extremity Assessment: Defer to OT evaluation    Lower Extremity Assessment Lower  Extremity Assessment: Generalized weakness    Cervical / Trunk Assessment Cervical / Trunk Assessment: Other exceptions Cervical / Trunk Exceptions: s/p ALIF  Communication   Communication: No difficulties  Cognition Arousal/Alertness: Awake/alert Behavior During Therapy: WFL for tasks assessed/performed Overall Cognitive Status: Within Functional Limits for tasks assessed                                        General Comments General comments (skin integrity, edema, etc.): Pt's wife present. Educated about generalized walking program to perform at home     Exercises     Assessment/Plan    PT Assessment Patient needs continued PT services  PT Problem List Decreased strength;Decreased activity tolerance;Decreased mobility;Decreased balance;Decreased knowledge of use of DME;Decreased knowledge of precautions;Pain       PT Treatment Interventions DME instruction;Gait training;Stair training;Functional mobility training;Therapeutic activities;Therapeutic exercise;Neuromuscular re-education;Balance training;Patient/family education    PT Goals (Current goals can be found in the Care Plan section)  Acute Rehab PT Goals Patient Stated Goal: to go home  PT Goal Formulation: With patient Time For Goal Achievement: 12/11/16 Potential to Achieve Goals: Good    Frequency Min 5X/week   Barriers to discharge        Co-evaluation               AM-PAC PT "6 Clicks" Daily Activity  Outcome Measure Difficulty turning over in bed (including adjusting bedclothes, sheets and blankets)?: None Difficulty moving from lying on back to sitting on the side of the bed? : None Difficulty sitting down on and standing up from a chair with arms (e.g., wheelchair, bedside commode, etc,.)?: Unable Help needed moving to and from a bed to chair (including a wheelchair)?: A Little Help needed walking in hospital room?: A Little Help needed climbing 3-5 steps with a railing? : A  Lot 6 Click Score: 17    End of Session Equipment Utilized During Treatment: Gait belt;Back brace Activity Tolerance: Patient limited by pain Patient left: in bed;with call bell/phone within reach;with family/visitor present Nurse Communication: Mobility status PT Visit Diagnosis: Other abnormalities of gait and mobility (R26.89);Pain;Unsteadiness on feet (R26.81) Pain - part of body:  (back )    Time: 1610-9604 PT Time Calculation (min) (ACUTE ONLY): 19 min   Charges:   PT Evaluation $PT Eval Low Complexity: 1 Low     PT G Codes:        Gladys Damme, PT, DPT  Acute Rehabilitation Services  Pager: [phone]   Lehman Prom 11/27/2016, 6:07 PM

## 2016-11-27 NOTE — Anesthesia Preprocedure Evaluation (Signed)
Anesthesia Evaluation  Patient identified by MRN, date of birth, ID band Patient awake    Reviewed: Allergy & Precautions, NPO status , Patient's Chart, lab work & pertinent test results  Airway Mallampati: I  TM Distance: >3 FB Neck ROM: Full    Dental   Pulmonary former smoker,    Pulmonary exam normal        Cardiovascular hypertension, Pt. on medications Normal cardiovascular exam     Neuro/Psych    GI/Hepatic   Endo/Other    Renal/GU      Musculoskeletal   Abdominal   Peds  Hematology   Anesthesia Other Findings   Reproductive/Obstetrics                             Anesthesia Physical Anesthesia Plan  ASA: II  Anesthesia Plan: General   Post-op Pain Management:    Induction: Intravenous  PONV Risk Score and Plan: 2 and Ondansetron and Dexamethasone  Airway Management Planned: Oral ETT  Additional Equipment:   Intra-op Plan:   Post-operative Plan: Extubation in OR  Informed Consent: I have reviewed the patients History and Physical, chart, labs and discussed the procedure including the risks, benefits and alternatives for the proposed anesthesia with the patient or authorized representative who has indicated his/her understanding and acceptance.     Plan Discussed with: CRNA and Surgeon  Anesthesia Plan Comments:         Anesthesia Quick Evaluation

## 2016-11-27 NOTE — Op Note (Signed)
11/27/2016  1:18 PM  PATIENT:  Victor Ramirez  61 y.o. male  PRE-OPERATIVE DIAGNOSIS:  Lumbosacral spondylosis with radiculopathy; lumbar stenosis L3-4; Degeneration adjacent to a prior fusion  POST-OPERATIVE DIAGNOSIS:  Same  PROCEDURE:  L3-4 anterior lumbar interbody fusion via transpsoas approach; bilateral L3-4 decompression utilizing MetRx tube; L3-4 posterior non-segmental instrumented fusion; Use of BMP; microsurgical technique requiring use of operating microscope  SURGEON:  Hulan Saas, MD  ASSISTANTS: Cindra Presume, PA-C  ANESTHESIA:   General  DRAINS: None   SPECIMEN:  None  INDICATION FOR PROCEDURE: 61 year old male with lumbar radiculopathy due to adjacent segment degeneration.  I recommended the above operation.  The patient understood the risks, benefits, and alternatives and potential outcomes and wished to proceed.  PROCEDURE DETAILS: The patient was brought to the operating room.  After smooth induction of general endotracheal anesthesia the patient was placed in the lateral position with the right side up and secured with tape.  Localizing views were taken with fluoroscopy.  The operative site was then prepped and draped in the usual sterile fashion.  The planned incisions were infiltrated with lidocaine with epinephrine.   The skin was sharply incised and soft tissue to the level of the oblique abdominal muscles was carried out with monopolar cautery.  I then bluntly dissected through the oblique muscles with care taken to preserve any nerves.  I encountered encountered transversalis fascia which was bluntly entered and this opened into an easily dissected fat plane consistent with the retroperitoneum.  I inserted the dilator and approached the posterior third of the L3-4 disc space.  I confirmed that I was not in contact with any nerves of the lumbar plexus. A K-wire was passed into the disc space.  The tract was sequentially dilated and then the retractor was  introduced.  I incised the disc space and then passed a Cobb elevator along each endplate and penetrated the annulus on the contralateral side.  I then used box cutters, a paddle, and a rasp to complete the discectomy.  A trial spacer was used to determine appropriate graft size and then a PEEK lordotic interbody graft packed with BMP and grafton was inserted.  The retractor was removed after hemostasis was confirmed.   The incision wwas closed in layers with interrupted vicryl sutures.  The skin was closed with running monocryl stratafix suture and dermabond.  The patient was then turned prone on an open [place] table.  The patient was again prepped and draped in usual sterile fashion. A Schanz pin was inserted in the right iliac crest. The MazorX surgical robot was connected to the patient and registered to the patient's anatomy unit using fluoroscopy. The robot was used to plan the incisions. 2 vertical incisions were made after infiltrating with lidocaine and Marcaine with epinephrine. Exparel was injected along the planned screw tracts. The Mazor X robot was used as a drill guide to cannulate the pedicles at L3 and L4 bilaterally. K wires were placed into each pedicle and their position was confirmed with fluoroscopy. I then tapped over the K wires. I placed screws on the right side. I removed the left-sided K wires.  I sequentially dilated to track to the inferior edge of the L3 lamina on the left. Using a Metrx tube I was able to visualize the normal bony landmarks.  Subperiosteal dissection of the residual muscle was performed using monopolar cautery. The microscope was prepped and draped and brought into the operative field to provide light  and magnification. Using microsurgical technique I extended the lamina of L3. I elevated and resected the ligamentum flavum. I undercut the opposite side is lamina at L3. I continued to resect hypertrophied ligament. I identified the traversing L4 nerve root on  each side. I also undercut the superior edge of the lamina of L4 bilaterally. I was satisfied that these were decompressed.  There was excellent hemostasis. I irrigated with bacitracin saline. I instilled a solution of Toradol, Marcaine, and Depo-Medrol in the epidural space. I placed Gelfoam over this. I then used the Metrix tube to expose the left L3-4 facet. I drilled this level with transverse process. I then placed graft on over this decorticated surface to perform a posterior lateral arthrodesis.  The Mazor X robot was used to replace the K wires on the left side. I then placed the screws over the K wires. Position of all screws was confirmed with AP and lateral fluoroscopy. I then passed rods and reduce the rods so that I could place set screws to secure the rods bilaterally. The set screws were tightened with a torque wrench. Adequate rod length was confirmed with AP and lateral fluoroscopy.   The wounds were irrigated with bacitracin saline.  Vancomycin powder was placed within the wound. The wound was closed in routine anatomic layers using interrupted Vicryl sutures. The skin was closed with a Monocryl strata fix suture on each side. It was sealed with Dermabond. Sterile dressings were applied.   PATIENT DISPOSITION:  PACU - hemodynamically stable.   Delay start of Pharmacological VTE agent (>24hrs) due to surgical blood loss or risk of bleeding:  yes

## 2016-11-27 NOTE — Anesthesia Postprocedure Evaluation (Signed)
Anesthesia Post Note  Patient: Victor Ramirez  Procedure(s) Performed: Procedure(s) (LRB): Lumbar Three-Four Transpsoas Lumbar InterbodyFfusion (Right) Lumbar Three-Four Pedicle Screw Fixation with Posterolateral Arthrodesis with Minimally Invasive Decompression with Application of Robotic Assistance (N/A)     Patient location during evaluation: PACU Anesthesia Type: General Level of consciousness: awake and alert Pain management: pain level controlled Vital Signs Assessment: post-procedure vital signs reviewed and stable Respiratory status: spontaneous breathing, nonlabored ventilation, respiratory function stable and patient connected to nasal cannula oxygen Cardiovascular status: blood pressure returned to baseline and stable Postop Assessment: no apparent nausea or vomiting Anesthetic complications: no    Last Vitals:  Vitals:   11/27/16 1355 11/27/16 1415  BP: (!) 139/95 (!) 145/93  Pulse: (!) 107 96  Resp: 12 15  Temp:    SpO2: 97% 98%    Last Pain:  Vitals:   11/27/16 1415  TempSrc:   PainSc: 7                  [name]

## 2016-11-27 NOTE — Transfer of Care (Signed)
Immediate Anesthesia Transfer of Care Note  Patient: Victor Ramirez  Procedure(s) Performed: Procedure(s) with comments: Lumbar Three-Four Transpsoas Lumbar InterbodyFfusion (Right) - L3-4 Transpsoas lumbar interbody fusion/L3-4 Pedicle screw fixation with posterolateral arthrodesis/minimally invasive decompression/Mazor Lumbar Three-Four Pedicle Screw Fixation with Posterolateral Arthrodesis with Minimally Invasive Decompression with Application of Robotic Assistance (N/A)  Patient Location: PACU  Anesthesia Type:General  Level of Consciousness: drowsy and patient cooperative  Airway & Oxygen Therapy: Patient Spontanous Breathing and Patient connected to face mask oxygen  Post-op Assessment: Report given to RN and Post -op Vital signs reviewed and stable  Post vital signs: Reviewed and stable  Last Vitals:  Vitals:   11/27/16 0647 11/27/16 1340  BP: (!) 160/108   Pulse: 81   Resp: 20   Temp: 36.9 C (!) 36.3 C  SpO2: 100%     Last Pain:  Vitals:   11/27/16 1340  TempSrc: Oral  PainSc:          Complications: No apparent anesthesia complications

## 2016-11-27 NOTE — H&P (Signed)
Chief Complaint   Lower back pain  HPI   HPI: Victor Ramirez is a 61 y.o. male who presents today for elective lumbar surgery. He has a long standing history of lower back pain with radiation down both legs. He has failed conservative treatment to date. He feels well this am and is ready for surgery. He denies any concerns.  There are no active problems to display for this patient.   PMH: Past Medical History:  Diagnosis Date  . Hemochromatosis   . Hypertension     PSH: Past Surgical History:  Procedure Laterality Date  . BACK SURGERY     x3   . BACK SURGERY     battery surgery   . spinal manipulation under anesthesia      Prescriptions Prior to Admission  Medication Sig Dispense Refill Last Dose  . cetirizine (ZYRTEC) 10 MG tablet Take 10 mg by mouth daily.   11/27/2016 at 0430  . fluticasone (FLONASE) 50 MCG/ACT nasal spray Place 2 sprays into both nostrils daily.   11/27/2016 at 0430  . HYDROcodone-acetaminophen (NORCO/VICODIN) 5-325 MG tablet Take 0.5 tablets by mouth 2 (two) times daily as needed for moderate pain.   0 11/27/2016 at 0430  . lisinopril (PRINIVIL,ZESTRIL) 40 MG tablet Take 40 mg by mouth daily.   11/26/2016 at Unknown time    SH: Social History  Substance Use Topics  . Smoking status: Former Smoker    Types: Cigarettes  . Smokeless tobacco: Never Used     Comment: quit over 10 years ago, 2000  . Alcohol use 6.0 oz/week    1 Shots of liquor, 9 Cans of beer per week    MEDS: Prior to Admission medications   Medication Sig Start Date End Date Taking? Authorizing Provider  cetirizine (ZYRTEC) 10 MG tablet Take 10 mg by mouth daily.   Yes [provider]  fluticasone (FLONASE) 50 MCG/ACT nasal spray Place 2 sprays into both nostrils daily.   Yes [provider]  HYDROcodone-acetaminophen (NORCO/VICODIN) 5-325 MG tablet Take 0.5 tablets by mouth 2 (two) times daily as needed for moderate pain.  10/28/16  Yes [provider]   lisinopril (PRINIVIL,ZESTRIL) 40 MG tablet Take 40 mg by mouth daily. 08/15/16  Yes [provider]    ALLERGY: No Known Allergies  Social History  Substance Use Topics  . Smoking status: Former Smoker    Types: Cigarettes  . Smokeless tobacco: Never Used     Comment: quit over 10 years ago, 2000  . Alcohol use 6.0 oz/week    1 Shots of liquor, 9 Cans of beer per week     History reviewed. No pertinent family history.   ROS   Review of Systems  Constitutional: Negative.   HENT: Negative.   Eyes: Negative.   Respiratory: Negative.   Cardiovascular: Negative.   Gastrointestinal: Negative.   Genitourinary: Negative.   Musculoskeletal: Positive for back pain, joint pain and myalgias.  Skin: Negative.   Neurological: Positive for tingling. Negative for dizziness, tremors, sensory change, speech change, focal weakness, seizures, loss of consciousness and headaches.  Endo/Heme/Allergies: Negative.     Exam   Vitals:   11/27/16 0647  BP: (!) 160/108  Pulse: 81  Resp: 20  Temp: 98.4 F (36.9 C)  SpO2: 100%   General appearance: WDWN, NAD, resting comfortably Eyes: PERRL, Fundoscopic: normal Cardiovascular: Regular rate and rhythm without murmurs, rubs, gallops. No edema or variciosities. Distal pulses normal. Pulmonary: Clear to auscultation Musculoskeletal:  Muscle tone upper extremities: Normal    Muscle tone lower extremities: Normal    Motor exam: Upper Extremities Deltoid Bicep Tricep Grip  Right 5/5 5/5 5/5 5/5  Left 5/5 5/5 5/5 5/5   Lower Extremity IP Quad PF DF EHL  Right 4/5 4/5 4/5 4/5 4/5  Left 5/5 5/5 5/5 5/5 5/5  Note: chronic weakness RLE due to polio as a child  Neurological Awake, alert, oriented Memory and concentration grossly intact Speech fluent, appropriate CNII: Visual fields normal CNIII/IV/VI: EOMI CNV: Facial sensation normal CNVII: Symmetric, normal strength CNVIII: Grossly normal CNIX: Normal palate movement CNXI:  Trap and SCM strength normal CN XII: Tongue protrusion normal Sensation grossly intact to LT DTR: Normal Coordination (finger/nose & heel/shin): Normal  Results - Imaging/Labs   No results found for this or any previous visit (from the past 48 hour(s)).  No results found.  Impression/Plan   61 y.o. male with lumbar stenosis secondary to large disc bulge at L3-4. I have recommended L3-4 anterior interbody fusion via transpsoas approach, L3-4 decompression, and posterior instrumented fusion.  He and I have reviewed the risks, benefits, and alternatives to surgery.  He wishes to proceed.

## 2016-11-27 NOTE — Anesthesia Procedure Notes (Signed)
Procedure Name: Intubation Date/Time: 11/27/2016 9:19 AM Performed by: Lance Coon Pre-anesthesia Checklist: Patient identified, Emergency Drugs available, Suction available, Patient being monitored and Timeout performed Patient Re-evaluated:Patient Re-evaluated prior to induction Oxygen Delivery Method: Circle system utilized Preoxygenation: Pre-oxygenation with 100% oxygen Induction Type: IV induction Ventilation: Mask ventilation without difficulty Laryngoscope Size: Mac and 4 Grade View: Grade II Tube type: Oral Tube size: 7.5 mm Number of attempts: 1 Airway Equipment and Method: Stylet Placement Confirmation: positive ETCO2,  breath sounds checked- equal and bilateral and ETT inserted through vocal cords under direct vision Secured at: 22 cm Tube secured with: Tape Dental Injury: Teeth and Oropharynx as per pre-operative assessment

## 2016-11-28 LAB — CBC
HEMATOCRIT: 37 % — AB (ref 39.0–52.0)
HEMOGLOBIN: 12.7 g/dL — AB (ref 13.0–17.0)
MCH: 32.4 pg (ref 26.0–34.0)
MCHC: 34.3 g/dL (ref 30.0–36.0)
MCV: 94.4 fL (ref 78.0–100.0)
Platelets: 214 10*3/uL (ref 150–400)
RBC: 3.92 MIL/uL — ABNORMAL LOW (ref 4.22–5.81)
RDW: 12.5 % (ref 11.5–15.5)
WBC: 15.3 10*3/uL — ABNORMAL HIGH (ref 4.0–10.5)

## 2016-11-28 LAB — BASIC METABOLIC PANEL
Anion gap: 7 (ref 5–15)
BUN: 7 mg/dL (ref 6–20)
CHLORIDE: 100 mmol/L — AB (ref 101–111)
CO2: 28 mmol/L (ref 22–32)
CREATININE: 0.74 mg/dL (ref 0.61–1.24)
Calcium: 8.9 mg/dL (ref 8.9–10.3)
GFR calc non Af Amer: >60 mL/min (ref >60)
Glucose, Bld: 158 mg/dL — ABNORMAL HIGH (ref 65–99)
Potassium: 4.4 mmol/L (ref 3.5–5.1)
Sodium: 135 mmol/L (ref 135–145)

## 2016-11-28 MED ORDER — GABAPENTIN 300 MG PO CAPS: 300.0000 mg | ORAL_CAPSULE | Freq: Three times a day (TID) | ORAL | 2 refills | Status: AC

## 2016-11-28 MED ORDER — METHOCARBAMOL 750 MG PO TABS: 750.0000 mg | ORAL_TABLET | Freq: Four times a day (QID) | ORAL | 2 refills | Status: DC

## 2016-11-28 MED ORDER — HYDROCODONE-ACETAMINOPHEN 5-325 MG PO TABS: 1.0000 | ORAL_TABLET | ORAL | 0 refills | Status: DC | PRN

## 2016-11-28 NOTE — Progress Notes (Signed)
Physical Therapy Treatment Patient Details Name: Victor Ramirez MRN: [id number] DOB: Jul 13, 1955 Today's Date: 11/28/2016    History of Present Illness Pt is a 61 y/o male s/p L3-4 ALIF. PMH includes HTN, back surgery, and hemachromatosis.     PT Comments    Pt initially had deferred PT visit stating he was doing well with mobility and had no questions or concerns.  However after speaking with RN, she feels pt really needs to participate in physical therapy before discharging home.  Spoke to pt again and he was then agreeable to ambulate and complete stair training.  Ambulated ~120' with no AD with supervision, and ascended/descended 4 steps using 1 rail with min guard.  Discussed with pt about using RW for increased distances to help offload weight from LE's & back and conserve energy.  Pt states he is agreeable to doing so.  Educated on importance of ambulating at least every 1-1.5 hrs & to start with shorter distances more frequently & build on that to improve strength.  Patient safe to D/C from a mobility standpoint based on progression towards goals set on PT eval.     Follow Up Recommendations        Equipment Recommendations       Recommendations for Other Services       Precautions / Restrictions Precautions Precautions: Back Precaution Comments: patient able to recall 3/3 back precautions Required Braces or Orthoses: Spinal Brace Spinal Brace: Lumbar corset;Applied in sitting position Restrictions Weight Bearing Restrictions: No    Mobility  Bed Mobility Overal bed mobility: Needs Assistance Bed Mobility: Sit to Sidelying;Rolling;Sidelying to Sit Rolling: Supervision Sidelying to sit: Supervision     Sit to sidelying: Supervision General bed mobility comments: Supervision to ensure log roll tehcnique. Bed positioned to simulated home with [facility] and bed rails lowered  Transfers Overall transfer level: Needs assistance Equipment used: Rolling walker (2  wheeled);None Transfers: Sit to/from Stand Sit to Stand: Supervision         General transfer comment: cues for hand placement  Ambulation/Gait Ambulation/Gait assistance: Supervision Ambulation Distance (Feet): 120 Feet Assistive device: None Gait Pattern/deviations: Step-through pattern;Wide base of support     General Gait Details: supervision for safety   Stairs Stairs: Yes   Stair Management: One rail Right;Forwards Number of Stairs: 4 General stair comments: cues for sequencing  Wheelchair Mobility    Modified Rankin (Stroke Patients Only)       Balance Overall balance assessment: Needs assistance Sitting-balance support: No upper extremity supported;Feet supported Sitting balance-Leahy Scale: Good     Standing balance support: During functional activity;No upper extremity supported Standing balance-Leahy Scale: Fair Standing balance comment: Able to maintain standing balance after assistance to steady intitally with Min A                            Cognition Arousal/Alertness: Awake/alert Behavior During Therapy: WFL for tasks assessed/performed Overall Cognitive Status: Within Functional Limits for tasks assessed                                 General Comments: mildly impulsive       Exercises      General Comments General comments (skin integrity, edema, etc.): wife present entire visit      Pertinent Vitals/Pain Pain Assessment: 0-10 Pain Score: 3  Faces Pain Scale: Hurts little more Pain Location: back  Pain  Descriptors / Indicators: Aching Pain Intervention(s): Monitored during session;Premedicated before session;Repositioned    Home Living Family/patient expects to be discharged to:: Private residence Living Arrangements: Spouse/significant other;Children Available Help at Discharge: Family;Available 24 hours/day Type of Home: House Home Access: Stairs to enter Entrance Stairs-Rails: Right Home Layout:  Two level;Able to live on main level with bedroom/bathroom Home Equipment: None      Prior Function Level of Independence: Independent      Comments: ADLs, IADLs, working as a [employer] architect   PT Goals (current goals can now be found in the care plan section) Acute Rehab PT Goals Patient Stated Goal: to go home  PT Goal Formulation: With patient Time For Goal Achievement: 12/11/16 Potential to Achieve Goals: Good    Frequency           PT Plan      Co-evaluation              AM-PAC PT "6 Clicks" Daily Activity  Outcome Measure                   End of Session               Time: 1610-9604 PT Time Calculation (min) (ACUTE ONLY): 15 min  Charges:  $Gait Training: 8-22 mins                    G CodesVerdell Face, [place] [phone] 11/28/2016

## 2016-11-28 NOTE — Care Management Note (Signed)
Case Management Note  Patient Details  Name: Victor Ramirez MRN: [id number] Date of Birth: September 22, 1955  Subjective/Objective:                 Patient with order to DC to home today. Chart reviewed. No Home Health or Equipment needs, no unacknowledged Case Management consults or medication needs identified at the time of this note. Bedside RN will provide DME to patient as needed. Plan for DC to home. If needs arise today prior to discharge, please call Lawerance Sabal RN CM at [phone].    Action/Plan:  DC home self care.  Expected Discharge Date:  11/28/16               Expected Discharge Plan:  Home/Self Care  In-House Referral:     Discharge planning Services  CM Consult  Post Acute Care Choice:    Choice offered to:     DME Arranged:    DME Agency:     HH Arranged:    HH Agency:     Status of Service:  Completed, signed off  If discussed at [employer] of Stay Meetings, dates discussed:    Additional Comments:  Lawerance Sabal, RN 11/28/2016, 9:24 AM

## 2016-11-28 NOTE — Evaluation (Signed)
Occupational Therapy Evaluation Patient Details Name: Victor Ramirez MRN: [id number] DOB: 10-23-1955 Today's Date: 11/28/2016    History of Present Illness Pt is a 61 y/o male s/p L3-4 ALIF. PMH includes HTN, back surgery, and hemachromatosis.    Clinical Impression   PTA, pt was living with his wife and was independent and working. Pt currently requiring Min Guard A for LB ADLs with AE and Min Guard for functional mobility. Provided education on back precautions, LB ADLs, toilet transfer, and tub transfer with 3N1. Provided all education and answered pt's questions in preparation for dc later today. Recommend dc home with 24 hour supervision/assistance once medically stable per physician.     Follow Up Recommendations  No OT follow up;Supervision/Assistance - 24 hour    Equipment Recommendations  3 in 1 bedside commode;Other (comment) (AE)    Recommendations for Other Services       Precautions / Restrictions Precautions Precautions: Back Precaution Comments: Reviewed back precautions with pt. At end of session, pt able to recall 3/3 precautions. Required Braces or Orthoses: Spinal Brace Spinal Brace: Lumbar corset;Applied in sitting position Restrictions Weight Bearing Restrictions: No      Mobility Bed Mobility Overal bed mobility: Needs Assistance Bed Mobility: Sit to Sidelying;Rolling;Sidelying to Sit Rolling: Supervision Sidelying to sit: Supervision     Sit to sidelying: Supervision General bed mobility comments: Supervision to ensure log roll tehcnique. Bed positioned to simulated home with HOB and bed rails lowered  Transfers Overall transfer level: Needs assistance Equipment used: 1 person hand held assist Transfers: Sit to/from Stand Sit to Stand: Min assist         General transfer comment: Min A to steady in standing    Balance Overall balance assessment: Needs assistance Sitting-balance support: No upper extremity supported;Feet  supported Sitting balance-Leahy Scale: Good     Standing balance support: During functional activity;No upper extremity supported Standing balance-Leahy Scale: Fair Standing balance comment: Able to maintain standing balance after assistance to steady intitally with Min A                           ADL either performed or assessed with clinical judgement   ADL Overall ADL's : Needs assistance/impaired Eating/Feeding: Set up;Sitting   Grooming: Min guard;Standing   Upper Body Bathing: Set up;Supervision/ safety;Sitting   Lower Body Bathing: With caregiver independent assisting;Sit to/from stand;Min guard   Upper Body Dressing : Set up;Supervision/safety;Sitting Upper Body Dressing Details (indicate cue type and reason): donned shirt in sitting Lower Body Dressing: Min guard;With adaptive equipment;Sit to/from stand;Cueing for compensatory techniques Lower Body Dressing Details (indicate cue type and reason): Able to bring ankle to knee for donning/doffing socks and shoes. Pt donned underwear and pants with AE and Min Guard A for standing balance and safety Toilet Transfer: Min guard;Ambulation Toilet Transfer Details (indicate cue type and reason): Min Guard for safety and simulated in the room   Toileting - Clothing Manipulation Details (indicate cue type and reason): Educated on adhering to back precautions during toielt hyigene after BM Tub/ Shower Transfer: Tub transfer;Minimal assistance;Ambulation Tub/Shower Transfer Details (indicate cue type and reason): Simulated tub transfer. Pt performed with Min A for safety Functional mobility during ADLs: Min guard General ADL Comments: Provided education on LB ADLs, tub transfer, bed mobility, toilet transfer, and managing back brace. Pt wife present throughout session.      Vision         Perception  Praxis      Pertinent Vitals/Pain Pain Assessment: Faces Faces Pain Scale: Hurts little more Pain Location: back   Pain Descriptors / Indicators: Constant;Discomfort Pain Intervention(s): Monitored during session;Repositioned     Hand Dominance Right   Extremity/Trunk Assessment Upper Extremity Assessment Upper Extremity Assessment: Overall WFL for tasks assessed   Lower Extremity Assessment Lower Extremity Assessment: Defer to PT evaluation   Cervical / Trunk Assessment Cervical / Trunk Assessment: Other exceptions Cervical / Trunk Exceptions: s/p ALIF   Communication Communication Communication: No difficulties   Cognition Arousal/Alertness: Awake/alert Behavior During Therapy: WFL for tasks assessed/performed Overall Cognitive Status: Within Functional Limits for tasks assessed                                 General Comments: Little impulsive. suspected due to medication   General Comments  Wife present throughout session    Exercises     Shoulder Instructions      Home Living Family/patient expects to be discharged to:: Private residence Living Arrangements: Spouse/significant other;Children Available Help at Discharge: Family;Available 24 hours/day Type of Home: House Home Access: Stairs to enter [employer] of Steps: 3 Entrance Stairs-Rails: Right Home Layout: Two level;Able to live on main level with bedroom/bathroom     Bathroom Shower/Tub: Chief Strategy Officer: Standard     Home Equipment: None          Prior Functioning/Environment Level of Independence: Independent        Comments: ADLs, IADLs, working as a Product/process development scientist Problem List: Decreased range of motion;Decreased activity tolerance;Impaired balance (sitting and/or standing);Decreased knowledge of use of DME or AE;Decreased knowledge of precautions;Pain      OT Treatment/Interventions:      OT Goals(Current goals can be found in the care plan section) Acute Rehab OT Goals Patient Stated Goal: to go home  OT Goal Formulation: With  patient Time For Goal Achievement: 12/12/16 Potential to Achieve Goals: Good  OT Frequency:     Barriers to D/C:            Co-evaluation              AM-PAC PT "6 Clicks" Daily Activity     Outcome Measure Help from another person eating meals?: None Help from another person taking care of personal grooming?: A Little Help from another person toileting, which includes using toliet, bedpan, or urinal?: A Little Help from another person bathing (including washing, rinsing, drying)?: A Little Help from another person to put on and taking off regular upper body clothing?: A Little Help from another person to put on and taking off regular lower body clothing?: A Little 6 Click Score: 19   End of Session Equipment Utilized During Treatment: Rolling walker;Back brace Nurse Communication: Mobility status  Activity Tolerance: Patient tolerated treatment well Patient left: with call bell/phone within reach;with family/visitor present (At [employer] eating breakfast)  OT Visit Diagnosis: Unsteadiness on feet (R26.81);Other abnormalities of gait and mobility (R26.89);Pain Pain - Right/Left:  (Back) Pain - part of body:  (Back)                Time: 1610-9604 OT Time Calculation (min): 21 min Charges:  OT General Charges $OT Visit: 1 Visit OT Evaluation $OT Eval Low Complexity: 1 Low G-Codes:     Teiana Hajduk MSOT, OTR/L Acute Rehab Pager: [phone] Office: [phone]  Luan Moore  M Atthew Coutant 11/28/2016, 9:16 AM

## 2016-11-28 NOTE — Progress Notes (Signed)
Patient alert and oriented, mae's well, voiding adequate amount of urine, swallowing without difficulty,  C/o mild pain at time of discharge. Patient discharged home with family. Script and discharged instructions given to patient. Patient and family stated understanding of instructions given. Patient has an appointment with Dr. Bevely Palmer

## 2016-11-28 NOTE — Progress Notes (Signed)
PT Cancellation Note  Patient Details Name: Victor Ramirez MRN: [id number] DOB: 01-03-1956   Cancelled Treatment:     Pt deferring PT session at this time.  Per chart review, is discharging today.  Pt says he ambulated in hallway with Nsing using RW and feels he is doing well.  States he has no questions or concerns.     Verdell Face, PTA  11/28/2016

## 2016-11-28 NOTE — Discharge Summary (Signed)
Physician Discharge Summary  Patient ID: Victor Ramirez MRN: [id number] DOB/AGE: Apr 23, 1955 61 y.o.  Admit date: 11/27/2016 Discharge date: 11/28/2016  Admission Diagnoses:  Lumbosacral spondylosis with radiculopathy  Discharge Diagnoses:  Same Active Problems:   Lumbosacral spondylosis with radiculopathy  Discharged Condition: Stable  Hospital Course:  Victor Ramirez is a 61 y.o. male who was admitted for the below procedure. There were no post operative complications. At time of discharge, pain was well controlled, ambulating with Pt/OT, tolerating po, voiding normal. Ready for discharge.  Treatments: Surgery L3-4 anterior lumbar interbody fusion via transpsoas approach; bilateral L3-4 decompression utilizing MetRx tube; L3-4 posterior non-segmental instrumented fusion; Use of BMP; microsurgical technique requiring use of operating microscope  Discharge Exam: Blood pressure (!) 158/89, pulse 68, temperature 98.1 F (36.7 C), temperature source Oral, resp. rate 18, height  (1.727 m), weight 88.9 kg (196 lb), SpO2 100 %. Awake, alert, oriented Speech fluent, appropriate CN grossly intact MAEW with good strength Wound c/d/i  Disposition: Final discharge disposition not confirmed  Discharge Instructions    Call MD for:  difficulty breathing, headache or visual disturbances    Complete by:  As directed    Call MD for:  persistant dizziness or light-headedness    Complete by:  As directed    Call MD for:  redness, tenderness, or signs of infection (pain, swelling, redness, odor or green/yellow discharge around incision site)    Complete by:  As directed    Call MD for:  severe uncontrolled pain    Complete by:  As directed    Call MD for:  temperature >100.4    Complete by:  As directed    Diet general    Complete by:  As directed    Driving Restrictions    Complete by:  As directed    Do not drive until given clearance.   Increase activity slowly    Complete by:   As directed    Lifting restrictions    Complete by:  As directed    Do not lift anything >10lbs. Avoid bending and twisting in awkward positions. Avoid bending at the back.   May shower / Bathe    Complete by:  As directed    In 24 hours. Okay to wash wound with warm soapy water. Avoid scrubbing the wound. Pat dry.   Remove dressing in 24 hours    Complete by:  As directed      Allergies as of 11/28/2016   No Known Allergies     Medication List    TAKE these medications   cetirizine 10 MG tablet Commonly known as:  ZYRTEC Take 10 mg by mouth daily.   fluticasone 50 MCG/ACT nasal spray Commonly known as:  FLONASE Place 2 sprays into both nostrils daily.   gabapentin 300 MG capsule Commonly known as:  NEURONTIN Take 1 capsule (300 mg total) by mouth 3 (three) times daily.   HYDROcodone-acetaminophen 5-325 MG tablet Commonly known as:  NORCO/VICODIN Take 1-2 tablets by mouth every 4 (four) hours as needed for moderate pain. What changed:  how much to take  when to take this   lisinopril 40 MG tablet Commonly known as:  PRINIVIL,ZESTRIL Take 40 mg by mouth daily.   methocarbamol 750 MG tablet Commonly known as:  ROBAXIN Take 1 tablet (750 mg total) by mouth 4 (four) times daily.            Durable Medical Equipment  Start     Ordered   11/27/16 1802  For home use only DME 3 n 1  Once     11/27/16 1801   11/27/16 1802  For home use only DME Walker rolling  Once    Question:  Patient needs a walker to treat with the following condition  Answer:  S/P lumbar spinal fusion   11/27/16 1801       Discharge Care Instructions        Start     Ordered   11/28/16 0000  gabapentin (NEURONTIN) 300 MG capsule  3 times daily     11/28/16 0809   11/28/16 0000  HYDROcodone-acetaminophen (NORCO/VICODIN) 5-325 MG tablet  Every 4 hours PRN     11/28/16 0809   11/28/16 0000  methocarbamol (ROBAXIN) 750 MG tablet  4 times daily     11/28/16 0809   11/28/16  0000  Increase activity slowly     11/28/16 0809   11/28/16 0000  May shower / Bathe    Comments:  In 24 hours. Okay to wash wound with warm soapy water. Avoid scrubbing the wound. Pat dry.   11/28/16 0809   11/28/16 0000  Driving Restrictions    Comments:  Do not drive until given clearance.   11/28/16 0809   11/28/16 0000  Lifting restrictions    Comments:  Do not lift anything >10lbs. Avoid bending and twisting in awkward positions. Avoid bending at the back.   11/28/16 0809   11/28/16 0000  Diet general     11/28/16 0809   11/28/16 0000  Remove dressing in 24 hours     11/28/16 0809   11/28/16 0000  Call MD for:  temperature >100.4     11/28/16 0809   11/28/16 0000  Call MD for:  severe uncontrolled pain     11/28/16 0809   11/28/16 0000  Call MD for:  redness, tenderness, or signs of infection (pain, swelling, redness, odor or green/yellow discharge around incision site)     11/28/16 0809   11/28/16 0000  Call MD for:  difficulty breathing, headache or visual disturbances     11/28/16 0809   11/28/16 0000  Call MD for:  persistant dizziness or light-headedness     11/28/16 0809       Signed: Alyson Ingles 11/28/2016, 8:09 AM

## 2016-11-28 NOTE — Progress Notes (Signed)
No acute events Doing great Very happy Moving legs well Bandages c/d/i D/c home

## 2016-11-29 ENCOUNTER — Encounter (HOSPITAL_COMMUNITY): Payer: Self-pay | Admitting: Neurological Surgery

## 2016-11-30 MED FILL — Heparin Sodium (Porcine) Inj 1000 Unit/ML: INTRAMUSCULAR | Qty: 30 | Status: AC

## 2016-11-30 MED FILL — Sodium Chloride IV Soln 0.9%: INTRAVENOUS | Qty: 1000 | Status: AC

## 2017-02-16 ENCOUNTER — Other Ambulatory Visit: Payer: Self-pay | Admitting: Neurological Surgery

## 2017-02-16 DIAGNOSIS — M545 Low back pain, unspecified: Secondary | ICD-10-CM

## 2017-02-17 ENCOUNTER — Ambulatory Visit
Admission: RE | Admit: 2017-02-17 | Discharge: 2017-02-17 | Disposition: A | Discharge status: Home / Self Care | Payer: 59 | Source: Ambulatory Visit | Attending: Neurological Surgery | Admitting: Neurological Surgery

## 2017-02-17 DIAGNOSIS — M545 Low back pain, unspecified: Secondary | ICD-10-CM

## 2018-03-18 ENCOUNTER — Other Ambulatory Visit: Payer: Self-pay | Admitting: Family Medicine

## 2018-03-18 DIAGNOSIS — M25551 Pain in right hip: Secondary | ICD-10-CM

## 2018-03-24 ENCOUNTER — Other Ambulatory Visit (HOSPITAL_COMMUNITY): Payer: Self-pay

## 2018-03-24 ENCOUNTER — Encounter (HOSPITAL_COMMUNITY): Payer: Self-pay | Admitting: *Deleted

## 2018-03-24 ENCOUNTER — Emergency Department (HOSPITAL_COMMUNITY)
Admission: EM | Admit: 2018-03-24 | Discharge: 2018-03-24 | Disposition: A | Discharge status: Home / Self Care | Payer: 59 | Attending: Emergency Medicine | Admitting: Emergency Medicine

## 2018-03-24 DIAGNOSIS — Z87891 Personal history of nicotine dependence: Secondary | ICD-10-CM | POA: Insufficient documentation

## 2018-03-24 DIAGNOSIS — I10 Essential (primary) hypertension: Secondary | ICD-10-CM | POA: Insufficient documentation

## 2018-03-24 DIAGNOSIS — M25551 Pain in right hip: Secondary | ICD-10-CM | POA: Insufficient documentation

## 2018-03-24 DIAGNOSIS — Z79899 Other long term (current) drug therapy: Secondary | ICD-10-CM | POA: Insufficient documentation

## 2018-03-24 NOTE — ED Notes (Signed)
Xray states they can do the MRI , pt s friend then ask for pain meds and if it was open or closed , I states I had no idea and had he spoken to his dr about pain meds , pt d/c and shown to radiology all the while stating it is going to be out of pocket , pt states has pain meds at home

## 2018-03-24 NOTE — ED Triage Notes (Addendum)
Pt is here for right hip pain that has been getting worse.  Pt felt it first one month ago and became suddenly worse Monday before last.  Pt was seen by PCP and told him he should get an MRI and he is waiting for approval from insurance. Pt states that he can't take it anymore and decided to get help in ER.  This hip pain is not associated with any neuro deficits, he states that it is a burning pain. No hx of trauma to hip

## 2018-03-24 NOTE — ED Provider Notes (Signed)
[facility] EMERGENCY DEPARTMENT Provider Note   CSN: [id number] Arrival date & time: 03/24/18  1118   History   Chief Complaint Chief Complaint  Patient presents with  . Hip Pain    HPI Victor Ramirez is a 63 y.o. male.  HPI   63 year old male presents today with complaints of right hip pain.  Patient notes that over the last month he has had worsening pain at the anterior aspect of his right hip.  He notes it is worse with standing, also worse with flexion.  He notes no pain while at rest, no infectious etiology.  He has been seen by his PCP provider who recommended possible outpatient MRI for stroke versus labral etiology.  He notes that the order was placed last week but insurance has not approved it yet.  He notes that he is on chronic pain medication as he recently had a lumbar surgery, notes taking muscle relaxers and hydrocodone.  He notes the pain radiates from his hip down to his knee.  No trauma.   Past Medical History:  Diagnosis Date  . Hemochromatosis   . Hypertension     Patient Active Problem List   Diagnosis Date Noted  . Lumbosacral spondylosis with radiculopathy 11/27/2016    Past Surgical History:  Procedure Laterality Date  . ANTERIOR LATERAL LUMBAR FUSION WITH PERCUTANEOUS SCREW 1 LEVEL Right 11/27/2016   Procedure: Lumbar Three-Four Transpsoas Lumbar InterbodyFfusion;  Surgeon: Ditty, Loura Halt, MD;  Location: [facility] OR;  Service: Neurosurgery;  Laterality: Right;  L3-4 Transpsoas lumbar interbody fusion/L3-4 Pedicle screw fixation with posterolateral arthrodesis/minimally invasive decompression/Mazor  . APPLICATION OF ROBOTIC ASSISTANCE FOR SPINAL PROCEDURE N/A 11/27/2016   Procedure: Lumbar Three-Four Pedicle Screw Fixation with Posterolateral Arthrodesis with Minimally Invasive Decompression with Application of Robotic Assistance;  Surgeon: Ditty, Loura Halt, MD;  Location: [facility] OR;  Service: Neurosurgery;  Laterality: N/A;  . BACK  SURGERY     x3   . BACK SURGERY     battery surgery   . spinal manipulation under anesthesia          Home Medications    Prior to Admission medications   Medication Sig Start Date End Date Taking? Authorizing Provider  cetirizine (ZYRTEC) 10 MG tablet Take 10 mg by mouth daily.    [provider]  fluticasone (FLONASE) 50 MCG/ACT nasal spray Place 2 sprays into both nostrils daily.    [provider]  gabapentin (NEURONTIN) 300 MG capsule Take 1 capsule (300 mg total) by mouth 3 (three) times daily. 11/28/16   Costella, Darci Current, PA-C  HYDROcodone-acetaminophen (NORCO/VICODIN) 5-325 MG tablet Take 1-2 tablets by mouth every 4 (four) hours as needed for moderate pain. 11/28/16   Costella, Darci Current, PA-C  lisinopril (PRINIVIL,ZESTRIL) 40 MG tablet Take 40 mg by mouth daily. 08/15/16   [provider]  methocarbamol (ROBAXIN) 750 MG tablet Take 1 tablet (750 mg total) by mouth 4 (four) times daily. 11/28/16   Costella, Darci Current, PA-C    Family History No family history on file.  Social History Social History   Tobacco Use  . Smoking status: Former Smoker    Types: Cigarettes  . Smokeless tobacco: Never Used  . Tobacco comment: quit over 10 years ago, 2000  Substance Use Topics  . Alcohol use: Yes    Alcohol/week: 10.0 standard drinks    Types: 1 Shots of liquor, 9 Cans of beer per week  . Drug use: No     Allergies  Patient has no known allergies.   Review of Systems Review of Systems  All other systems reviewed and are negative.    Physical Exam Updated Vital Signs BP 116/70 (BP Location: Right Arm)   Pulse 93   Temp 98.5 F (36.9 C) (Oral)   Resp 18   Ht 5\' 8"  (1.727 m)   Wt 79.4 kg   SpO2 97%   BMI 26.61 kg/m   Physical Exam Vitals signs and nursing note reviewed.  Constitutional:      Appearance: He is well-developed.  HENT:     Head: Normocephalic and atraumatic.  Eyes:     General: No scleral icterus.       Right  eye: No discharge.        Left eye: No discharge.     Conjunctiva/sclera: Conjunctivae normal.     Pupils: Pupils are equal, round, and reactive to light.  Neck:     Musculoskeletal: Normal range of motion.     Vascular: No JVD.     Trachea: No tracheal deviation.  Pulmonary:     Effort: Pulmonary effort is normal.     Breath sounds: No stridor.  Musculoskeletal:     Comments: Right hip atraumatic no swelling or edema, minor tenderness palpation at the anterior aspect pain with flexion, no lateral tenderness palpation no rashes-ambulates with antalgic gait  Neurological:     Mental Status: He is alert and oriented to person, place, and time.     Coordination: Coordination normal.  Psychiatric:        Behavior: Behavior normal.        Thought Content: Thought content normal.        Judgment: Judgment normal.     ED Treatments / Results  Labs (all labs ordered are listed, but only abnormal results are displayed) Labs Reviewed - No data to display  EKG None  Radiology No results found.  Procedures Procedures (including critical care time)  Medications Ordered in ED Medications - No data to display   Initial Impression / Assessment and Plan / ED Course  I have reviewed the triage vital signs and the nursing notes.  Pertinent labs & imaging results that were available during my care of the patient were reviewed by me and considered in my medical decision making (see chart for details).     Labs:   Imaging: DG Hip  Consults:  Therapeutics:  Discharge Meds:   Assessment/Plan: 63 year old male presents today with hip pain.  Likely muscular in nature.  Patient requesting MRI here, informed him that nonemergent MRI for his hip pain would not be indicated at this time and he may follow-up as an outpatient for imaging.  He has no acute signs of infection or trauma, no other conditions that require immediate management in the emergent setting.  Final Clinical  Impressions(s) / ED Diagnoses   Final diagnoses:  Right hip pain    ED Discharge Orders    None       Rosalio Loud 03/24/18 1406    Jacalyn Lefevre, MD 03/25/18 1108

## 2018-03-24 NOTE — Discharge Instructions (Addendum)
Please read attached information. If you experience any new or worsening signs or symptoms please return to the emergency room for evaluation. Please follow-up with your primary care provider or specialist as discussed.  Please use previously prescribed medication as needed for discomfort.

## 2018-03-25 ENCOUNTER — Ambulatory Visit (INDEPENDENT_AMBULATORY_CARE_PROVIDER_SITE_OTHER): Payer: 59

## 2018-03-25 ENCOUNTER — Encounter (INDEPENDENT_AMBULATORY_CARE_PROVIDER_SITE_OTHER): Payer: Self-pay | Admitting: Family Medicine

## 2018-03-25 ENCOUNTER — Ambulatory Visit (INDEPENDENT_AMBULATORY_CARE_PROVIDER_SITE_OTHER): Payer: 59 | Admitting: Family Medicine

## 2018-03-25 DIAGNOSIS — M25551 Pain in right hip: Secondary | ICD-10-CM

## 2018-03-25 MED ORDER — ETODOLAC 400 MG PO TABS: 400.0000 mg | ORAL_TABLET | Freq: Two times a day (BID) | ORAL | 3 refills | Status: DC | PRN

## 2018-03-25 MED ORDER — BACLOFEN 10 MG PO TABS: 10.0000 mg | ORAL_TABLET | Freq: Three times a day (TID) | ORAL | 3 refills | Status: DC | PRN

## 2018-03-25 NOTE — Progress Notes (Signed)
Office Visit Note   Patient: Victor Ramirez           Date of Birth: 1956-02-02           MRN: [id number] Visit Date: 03/25/2018 Requested by: Wilfrid Lund, PA [address] [place], [place] [address] PCP: Wilfrid Lund, [place]  Subjective: Chief Complaint  Patient presents with  . Right Hip - Pain    Pain anterior hip x 1 month ago, but worse over the last 10 days. Burning pain. Radiates down to knee.    HPI: He is a 63 year old with right hip pain.  Symptoms started about a month ago, no injury.  He started feeling pain in the anterior hip and then 1 day his dog pulled him and he lurched forward causing the pain to get significantly worse.  He has severe pain when he first stands up to walk, he gets better after a few steps and then it does not bother him again until he sits down and tries to stand up again.  He went to his PCP last week who ordered an MRI scan.  He had not been scheduled for this in his pain was getting worse, he got frustrated and went to the ER yesterday requesting an MRI scan.  He was told that he could not have one emergently for this problem and now he presents for evaluation.  He is 5 back surgeries.               ROS: He has a history of hypertension, anxiety and depression.  He has post polio syndrome affecting his right leg.  Other systems were reviewed and are negative.  Objective: Vital Signs: There were no vitals taken for this visit.  Physical Exam:  Right hip: No tenderness over the greater trochanter, he has 5/5 hip flexion, knee extension, foot dorsiflexion, plantarflexion and EHL testing strength but slight weakness with external rotation compared to the left.  He has good range of motion of his hip with passive movement, but pain at the extreme of internal rotation.  No tenderness to direct palpation of his hip.  Imaging: X-rays right hip: Well-preserved joint space, no sign of stress fracture.  No acute bony abnormality.    Assessment  & Plan: 1.  Right hip pain, possible acetabular labrum tear. -MRI to evaluate, depending on the results we might try physical therapy or intra-articular injection.   Follow-Up Instructions: No follow-ups on file.      Procedures: No procedures performed  No notes on file    PMFS History: Patient Active Problem List   Diagnosis Date Noted  . Lumbosacral spondylosis with radiculopathy 11/27/2016  . Atrophy of muscle of right lower leg 02/04/2014  . History of post-polio syndrome 02/02/2014  . Pain of left lower extremity 02/02/2014  . Chronic back pain 05/14/2013  . HTN (hypertension) 05/14/2013  . Headache 12/22/2011  . Sleep apnea 11/17/2011  . Elevation of level of transaminase or lactic acid dehydrogenase (LDH) 09/18/2010  . Anxiety 09/08/2010  . Tachycardia 09/08/2010  . Hyperlipemia 03/01/2006  . TIA (transient ischemic attack) 03/01/2006  . Tobacco use disorder 03/01/2006  . Depressive disorder 10/16/2005   Past Medical History:  Diagnosis Date  . Hemochromatosis   . Hypertension     History reviewed. No pertinent family history.  Past Surgical History:  Procedure Laterality Date  . ANTERIOR LATERAL LUMBAR FUSION WITH PERCUTANEOUS SCREW 1 LEVEL Right 11/27/2016   Procedure: Lumbar Three-Four Transpsoas  Lumbar InterbodyFfusion;  Surgeon: Ditty, Loura Halt, MD;  Location: [facility] OR;  Service: Neurosurgery;  Laterality: Right;  L3-4 Transpsoas lumbar interbody fusion/L3-4 Pedicle screw fixation with posterolateral arthrodesis/minimally invasive decompression/Mazor  . APPLICATION OF ROBOTIC ASSISTANCE FOR SPINAL PROCEDURE N/A 11/27/2016   Procedure: Lumbar Three-Four Pedicle Screw Fixation with Posterolateral Arthrodesis with Minimally Invasive Decompression with Application of Robotic Assistance;  Surgeon: Ditty, Loura Halt, MD;  Location: [facility] OR;  Service: Neurosurgery;  Laterality: N/A;  . BACK SURGERY     x3   . BACK SURGERY     battery surgery   . spinal  manipulation under anesthesia     Social History   Occupational History  . Not on file  Tobacco Use  . Smoking status: Former Smoker    Types: Cigarettes  . Smokeless tobacco: Never Used  . Tobacco comment: quit over 10 years ago, 2000  Substance and Sexual Activity  . Alcohol use: Yes    Alcohol/week: 10.0 standard drinks    Types: 1 Shots of liquor, 9 Cans of beer per week  . Drug use: No  . Sexual activity: Not on file

## 2018-03-29 ENCOUNTER — Ambulatory Visit (HOSPITAL_COMMUNITY): Payer: 59

## 2018-03-30 ENCOUNTER — Telehealth (INDEPENDENT_AMBULATORY_CARE_PROVIDER_SITE_OTHER): Payer: Self-pay | Admitting: Family Medicine

## 2018-03-30 NOTE — Telephone Encounter (Signed)
[employer] [phone]  [place]       Please call pre-cert team to authorize patient MRI  [phone]

## 2018-03-30 NOTE — Telephone Encounter (Signed)
It looks like this should go to you.

## 2018-03-31 NOTE — Telephone Encounter (Signed)
I submitted authorization thru [employer] on 03/21/18 and has been Denied..waiting on Denial information to proceed further

## 2019-09-12 ENCOUNTER — Other Ambulatory Visit: Payer: Self-pay | Admitting: Family Medicine

## 2019-09-12 DIAGNOSIS — R0989 Other specified symptoms and signs involving the circulatory and respiratory systems: Secondary | ICD-10-CM

## 2019-09-21 ENCOUNTER — Ambulatory Visit
Admission: RE | Admit: 2019-09-21 | Discharge: 2019-09-21 | Disposition: A | Discharge status: Home / Self Care | Payer: 59 | Source: Ambulatory Visit | Attending: Family Medicine | Admitting: Family Medicine

## 2019-09-21 DIAGNOSIS — R0989 Other specified symptoms and signs involving the circulatory and respiratory systems: Secondary | ICD-10-CM

## 2020-03-14 DIAGNOSIS — E782 Mixed hyperlipidemia: Secondary | ICD-10-CM | POA: Diagnosis not present

## 2020-03-14 DIAGNOSIS — I10 Essential (primary) hypertension: Secondary | ICD-10-CM | POA: Diagnosis not present

## 2020-03-14 DIAGNOSIS — I6523 Occlusion and stenosis of bilateral carotid arteries: Secondary | ICD-10-CM | POA: Diagnosis not present

## 2020-03-14 DIAGNOSIS — G8929 Other chronic pain: Secondary | ICD-10-CM | POA: Diagnosis not present

## 2020-03-20 DIAGNOSIS — Z20822 Contact with and (suspected) exposure to covid-19: Secondary | ICD-10-CM | POA: Diagnosis not present

## 2020-03-20 DIAGNOSIS — Z03818 Encounter for observation for suspected exposure to other biological agents ruled out: Secondary | ICD-10-CM | POA: Diagnosis not present

## 2020-06-12 DIAGNOSIS — I10 Essential (primary) hypertension: Secondary | ICD-10-CM | POA: Diagnosis not present

## 2020-06-12 DIAGNOSIS — G8929 Other chronic pain: Secondary | ICD-10-CM | POA: Diagnosis not present

## 2020-06-12 DIAGNOSIS — E782 Mixed hyperlipidemia: Secondary | ICD-10-CM | POA: Diagnosis not present

## 2020-06-12 DIAGNOSIS — I6523 Occlusion and stenosis of bilateral carotid arteries: Secondary | ICD-10-CM | POA: Diagnosis not present

## 2020-07-08 DIAGNOSIS — M79604 Pain in right leg: Secondary | ICD-10-CM | POA: Diagnosis not present

## 2020-07-08 DIAGNOSIS — M25511 Pain in right shoulder: Secondary | ICD-10-CM | POA: Diagnosis not present

## 2020-07-08 DIAGNOSIS — M25512 Pain in left shoulder: Secondary | ICD-10-CM | POA: Diagnosis not present

## 2020-07-22 DIAGNOSIS — M353 Polymyalgia rheumatica: Secondary | ICD-10-CM | POA: Diagnosis not present

## 2020-08-08 ENCOUNTER — Other Ambulatory Visit: Payer: Self-pay

## 2020-08-08 ENCOUNTER — Encounter: Payer: Self-pay | Admitting: Internal Medicine

## 2020-08-08 ENCOUNTER — Ambulatory Visit (INDEPENDENT_AMBULATORY_CARE_PROVIDER_SITE_OTHER): Payer: Medicare Other | Admitting: Internal Medicine

## 2020-08-08 VITALS — BP 127/89 | HR 109 | Ht 66.0 in | Wt 185.0 lb

## 2020-08-08 DIAGNOSIS — M2550 Pain in unspecified joint: Secondary | ICD-10-CM | POA: Diagnosis not present

## 2020-08-08 DIAGNOSIS — M545 Low back pain, unspecified: Secondary | ICD-10-CM | POA: Diagnosis not present

## 2020-08-08 DIAGNOSIS — Z79899 Other long term (current) drug therapy: Secondary | ICD-10-CM | POA: Diagnosis not present

## 2020-08-08 DIAGNOSIS — Z8612 Personal history of poliomyelitis: Secondary | ICD-10-CM | POA: Diagnosis not present

## 2020-08-08 DIAGNOSIS — M353 Polymyalgia rheumatica: Secondary | ICD-10-CM | POA: Diagnosis not present

## 2020-08-08 DIAGNOSIS — G8929 Other chronic pain: Secondary | ICD-10-CM

## 2020-08-08 NOTE — Progress Notes (Signed)
Office Visit Note  Patient: Victor Ramirez             Date of Birth: May 25, 1955           MRN: [id number]             PCP: Wilfrid Lund, PA Referring: Christena Deem, MD Visit Date: 08/08/2020  Subjective:  New Patient (Initial Visit) (Patient complains of upper back and neck pain with radiation to bilateral shoulders and upper extremities. )   History of Present Illness: Victor Ramirez is a 65 y.o. male referred for PMR. His symptoms started abruptly with severe pain in bilateral shoulders limiting use and mobility especially first thing in the morning.  He does not require any preceding injury or changes in health or medications. For evaluation and primary care clinic with x-ray imaging obtained that did not show any structural cause of symptoms he had markedly high inflammatory markers and was started on oral prednisone for suspected PMR.  He noticed dramatic improvement in symptoms within 2 days of starting the medicine.  At follow-up he was recommended to decrease the dose down to 15 mg of prednisone at which time he noticed a return of his symptoms.  He increased back to 20 mg and feels this is doing well again.  The most affected areas were in the base of his neck and bilateral upper back and shoulders with radiation down the upper portion of the arms.  He also felt like it was affecting his right hip and thigh much worse than left, where he has chronic deficits due to history of polio.  He has started to notice some weight gain with the oral prednisone otherwise no specific side effect problems.   Activities of Daily Living:  Patient reports morning stiffness for 1-2 hours.   Patient Reports nocturnal pain.  Difficulty dressing/grooming: Reports Difficulty climbing stairs: Reports Difficulty getting out of chair: Reports Difficulty using hands for taps, buttons, cutlery, and/or writing: Denies  Review of Systems  Constitutional:  Positive for fatigue.  HENT:  Negative for  mouth sores, mouth dryness and nose dryness.   Eyes:  Negative for pain, itching, visual disturbance and dryness.  Respiratory:  Negative for cough, hemoptysis, shortness of breath and difficulty breathing.   Cardiovascular:  Negative for chest pain, palpitations and swelling in legs/feet.  Gastrointestinal:  Negative for abdominal pain, blood in stool, constipation and diarrhea.  Endocrine: Negative for increased urination.  Genitourinary:  Negative for painful urination.  Musculoskeletal:  Positive for joint pain, joint pain, joint swelling, myalgias, muscle weakness, morning stiffness, muscle tenderness and myalgias.  Skin:  Negative for color change, rash and redness.  Allergic/Immunologic: Negative for susceptible to infections.  Neurological:  Positive for weakness. Negative for dizziness, numbness, headaches and memory loss.  Hematological:  Negative for swollen glands.  Psychiatric/Behavioral:  Positive for sleep disturbance. Negative for confusion.    PMFS History:  Patient Active Problem List   Diagnosis Date Noted   Polymyalgia rheumatica (HCC) 08/08/2020   Polyarthralgia 08/08/2020   High risk medication use 08/08/2020   Lumbosacral spondylosis with radiculopathy 11/27/2016   Atrophy of muscle of right lower leg 02/04/2014   History of post-polio syndrome 02/02/2014   Pain of left lower extremity 02/02/2014   Chronic back pain 05/14/2013   HTN (hypertension) 05/14/2013   Headache 12/22/2011   Sleep apnea 11/17/2011   Elevation of level of transaminase or lactic acid dehydrogenase (LDH) 09/18/2010   Anxiety 09/08/2010   Tachycardia  09/08/2010   Hyperlipemia 03/01/2006   TIA (transient ischemic attack) 03/01/2006   Tobacco use disorder 03/01/2006   Depressive disorder 10/16/2005    Past Medical History:  Diagnosis Date   Hemochromatosis    Hypertension     Family History  Problem Relation Age of Onset   Arthritis Mother    Thyroid cancer Mother    Melanoma  Father    Fibromyalgia Sister    Cancer Brother    Prostate cancer Brother    Williams syndrome Son    Past Surgical History:  Procedure Laterality Date   ANTERIOR LATERAL LUMBAR FUSION WITH PERCUTANEOUS SCREW 1 LEVEL Right 11/27/2016   Procedure: Lumbar Three-Four Transpsoas Lumbar InterbodyFfusion;  Surgeon: Ditty, Loura Halt, MD;  Location: [facility] OR;  Service: Neurosurgery;  Laterality: Right;  L3-4 Transpsoas lumbar interbody fusion/L3-4 Pedicle screw fixation with posterolateral arthrodesis/minimally invasive decompression/Mazor   APPLICATION OF ROBOTIC ASSISTANCE FOR SPINAL PROCEDURE N/A 11/27/2016   Procedure: Lumbar Three-Four Pedicle Screw Fixation with Posterolateral Arthrodesis with Minimally Invasive Decompression with Application of Robotic Assistance;  Surgeon: Ditty, Loura Halt, MD;  Location: [facility] OR;  Service: Neurosurgery;  Laterality: N/A;   BACK SURGERY     x3    BACK SURGERY     battery surgery    spinal manipulation under anesthesia     Social History   Social History Narrative   Not on file    There is no immunization history on file for this patient.   Objective: Vital Signs: BP 127/89 (BP Location: Right Arm, Patient Position: Sitting, Cuff Size: Normal)   Pulse (!) 109   Ht 5\' 6"  (1.676 m)   Wt 185 lb (83.9 kg)   BMI 29.86 kg/m    Physical Exam HENT:     Right Ear: External ear normal.     Left Ear: External ear normal.     Mouth/Throat:     Mouth: Mucous membranes are moist.     Pharynx: Oropharynx is clear.  Eyes:     Conjunctiva/sclera: Conjunctivae normal.  Cardiovascular:     Rate and Rhythm: Normal rate and regular rhythm.  Pulmonary:     Effort: Pulmonary effort is normal.     Breath sounds: Normal breath sounds.  Skin:    General: Skin is warm and dry.     Findings: No rash.  Neurological:     Mental Status: He is alert.  Psychiatric:        Mood and Affect: Mood normal.     Musculoskeletal Exam:  Tenderness to palpation  over lateral shoulders, neck tenderness with lateral rotation Elbows full ROM no tenderness or swelling Wrists full ROM no tenderness or swelling Fingers full ROM no tenderness or swelling Right thigh chronic muscle atrophy relative to the left Knees full ROM no tenderness or swelling Ankles full ROM no tenderness or swelling   Investigation: No additional findings.  Imaging: No results found.  Recent Labs: Lab Results  Component Value Date   WBC 11.9 (H) 08/08/2020   HGB 14.5 08/08/2020   PLT 318 08/08/2020   NA 135 08/08/2020   K 5.2 08/08/2020   CL 99 08/08/2020   CO2 25 08/08/2020   GLUCOSE 116 (H) 08/08/2020   BUN 18 08/08/2020   CREATININE 0.80 08/08/2020   BILITOT 0.5 08/08/2020   ALKPHOS 53 11/19/2010   AST 50 (H) 08/08/2020   ALT 54 (H) 08/08/2020   PROT 7.2 08/08/2020   ALBUMIN 4.5 11/19/2010   CALCIUM 9.9 08/08/2020  GFRAA 111 08/08/2020    Speciality Comments: No specialty comments available.  Procedures:  No procedures performed Allergies: Patient has no known allergies.   Assessment / Plan:     Visit Diagnoses: Polymyalgia rheumatica (HCC) - Plan: Hepatitis C RNA quantitative, Serum protein electrophoresis with reflex, Sedimentation rate, C-reactive protein  Symptoms do sound consistent for polymyalgia rheumatica based on timing joint distribution age  and responsiveness to low to moderate prednisone dose.  We will check for other serologic markers with HCV, SPEP.  Taking inflammatory markers at current steroid dose sed rate and CRP.  Assuming no other problems identified we will have to discuss slow prednisone taper over a span of at least 6 months.  If failure to tolerate tapering would consider early initiation of methotrexate.  Chronic low back pain, unspecified back pain laterality, unspecified whether sciatica present - Plan: Serum protein electrophoresis with reflex  Chronic low back pain predates and sounds unrelated to current PMR  episode.  History of post-polio syndrome  Chronic right leg atrophy suspect the functional problems and pain but this are worse due to the baseline status.  Polyarthralgia - Plan: Rheumatoid factor, Cyclic citrul peptide antibody, IgG  Checking rheumatoid factor and CCP antibodies to rule out seropositive rheumatoid arthritis that can also develop alongside PMR symptoms.  High risk medication use - Plan: Hepatitis B surface antigen, Hepatitis B core antibody, IgM, CBC with Differential/Platelet, COMPLETE METABOLIC PANEL WITH GFR  Anticipating possible need to start steroid sparing DMARD if he is not able to titrate prednisone to less than 20 mg without symptom relapse.  Checking hepatitis B and C antibody titers, baseline CBC and CMP.  Orders: Orders Placed This Encounter  Procedures   Hepatitis C RNA quantitative   Hepatitis B surface antigen   Hepatitis B core antibody, IgM   Serum protein electrophoresis with reflex   CBC with Differential/Platelet   COMPLETE METABOLIC PANEL WITH GFR   Rheumatoid factor   Cyclic citrul peptide antibody, IgG   Sedimentation rate   C-reactive protein    No orders of the defined types were placed in this encounter.   Follow-Up Instructions: Return in about 2 weeks (around 08/22/2020) for New pt PMR 20 mg prednisone f/u 2wks.   Fuller Plan, MD  Note - This record has been created using [employer].  Chart creation errors have been sought, but may not always  have been located. Such creation errors do not reflect on  the standard of medical care.

## 2020-08-08 NOTE — Patient Instructions (Signed)
Immunoglobulin Quantification Test Why am I having this test? The immunoglobulin (Ig) quantification test is used to detect and monitor various diseases, including infections, chronic liver disease, some cancers, autoimmune diseases, and acquired immunodeficiency syndrome (AIDS). What is being tested? This test checks for the concentration of immune system proteins (antibodies) called immunoglobulins in the blood. They include IgG, IgM, IgA, IgD, and IgE. Immunoglobulin levels may increase for a number of reasons, including the presence of certain cancers. In these types of cancer, the cells that produce immunoglobulins (plasma cells) divide rapidly and release more immunoglobulins. Decreased immunoglobulin levels are often found in people with a deficiency in their immune system that could be due to a disease or treatment for a disease. What kind of sample is taken? A blood sample is required for this test. It is usually collected by inserting a needle into a blood vessel.   How are the results reported? Your test results will be reported as values. Your health care provider will compare your results to normal ranges that were established after testing a large group of people (reference ranges). Reference ranges may vary among labs and hospitals. For this test, common reference ranges are: Immunoglobulin G (IgG). Adults: 160-1,093 mg/dL. Children: 250-1,600 mg/dL. Immunoglobulin A (IgA). Adults: 85-385 mg/dL. Children: 1-350 mg/dL. Immunoglobulin M (IgM). Adults: 55-375 mg/dL. Children: 20-200 mg/dL. Immunoglobulin D (IgD) and Immunoglobulin E (IgE). Minimal. What do the results mean? Levels of IgG that are higher than the reference range may indicate: Different infections. Autoimmune diseases. Chronic liver disease. Levels of IgG that are lower than the reference range may be associated with: AIDS. Different types of cancer. Various causes of suppressed immunity, including medications  or treatments for diseases such as cancer. Levels of IgA that are higher than the reference range may indicate: Chronic liver diseases. Chronic infections. Levels of IgA that are lower than the reference range may be associated with: Various causes of immunoglobulin deficiency, including medications or treatments for diseases such as cancer. Levels of IgM that are higher than the reference range may indicate: Certain rare cancers. Different infections. Autoimmune diseases. Chronic liver conditions. Levels of IgM that are lower than the reference range may be associated with: AIDS. Various causes of immunoglobulin deficiency. Various causes of suppressed immunity, including medications or treatments for diseases such as cancer. Levels of IgE that are higher than the reference range may indicate: Allergic reactions or allergic infections. Levels of IgE that are lower than the reference range may indicate: Inherited immunoglobulin deficiency. Talk with your health care provider about what your results mean. Questions to ask your health care provider Ask your health care provider, or the department that is doing the test: When will my results be ready? How will I get my results? What are my treatment options? What other tests do I need? What are my next steps? Summary The immunoglobulin quantification test is performed to detect and monitor various diseases. Immunoglobulins (Ig) are a type of antibody in the blood. They include IgG, IgM, IgA, IgD, and IgE. The levels of these antibodies may increase due to a number of conditions, such as in certain cancers. The levels of these antibodies may decrease because of a problem in the immune system. Talk with your health care provider about what your results may mean.  Erythrocyte Sedimentation Rate Test Why am I having this test? The erythrocyte sedimentation rate (ESR) test is used to help find illnesses related to: Sudden (acute) or  long-term (chronic) infections. Inflammation. The body's disease-fighting  system attacking healthy cells (autoimmune diseases). Cancer. Tissue death. If you have symptoms that may be related to any of these illnesses, your health care provider may do an ESR test before doing more specific tests. If you have an inflammatory immune disease, such as rheumatoid arthritis, you may have this test to help monitor your therapy. What is being tested? This test measures how long it takes for your red blood cells (erythrocytes) to settle in a solution over a certain amount of time (sedimentation rate). When you have an infection or inflammation, your red blood cells clump together and settle faster. The sedimentation rate provides information about how much inflammation is present in the body. What kind of sample is taken? A blood sample is required for this test. It is usually collected by inserting a needle into a blood vessel.   How do I prepare for this test? Follow any instructions from your health care provider about changing or stopping your regular medicines. Tell a health care provider about: Any allergies you have. All medicines you are taking, including vitamins, herbs, eye drops, creams, and over-the-counter medicines. Any blood disorders you have. Any surgeries you have had. Any medical conditions you have, such as thyroid or kidney disease. Whether you are pregnant or may be pregnant. How are the results reported? Your results will be reported as a value that measures sedimentation rate in millimeters per hour (mm/hr). Your health care provider will compare your results to normal ranges that were established after testing a large group of people (reference values). Reference values may vary among labs and hospitals. For this test, common reference values, which vary by age and gender, are: Newborn: 0-2 mm/hr. Child, up to puberty: 0-10 mm/hr. Male: Under 50 years: 0-20 mm/hr. 50-85  years: 0-30 mm/hr. Over 85 years: 0-42 mm/hr. Male: Under 50 years: 0-15 mm/hr. 50-85 years: 0-20 mm/hr. Over 85 years: 0-30 mm/hr. Certain conditions or medicines may cause ESR levels to be falsely lower or higher, such as: Pregnancy. Obesity. Steroids, birth control pills, and blood thinners. Thyroid or kidney disease. What do the results mean? Results that are within reference values are considered normal, meaning that the level of inflammation in your body is healthy. High ESR levels mean that there is inflammation in your body. You will have more tests to help make a diagnosis. Inflammation may result from many different conditions or injuries. Talk with your health care provider about what your results mean. Questions to ask your health care provider Ask your health care provider, or the department that is doing the test: When will my results be ready? How will I get my results? What are my treatment options? What other tests do I need? What are my next steps? Summary The erythrocyte sedimentation rate (ESR) test is used to help find illnesses associated with sudden (acute) or long-term (chronic) infections, inflammation, autoimmune diseases, cancer, or tissue death. If you have symptoms that may be related to any of these illnesses, your health care provider may do an ESR test before doing more specific tests. If you have an inflammatory immune disease, such as rheumatoid arthritis, you may have this test to help monitor your therapy. This test measures how long it takes for your red blood cells (erythrocytes) to settle in a solution over a certain amount of time (sedimentation rate). This provides information about how much inflammation is present in the body.

## 2020-08-13 LAB — CBC WITH DIFFERENTIAL/PLATELET
Absolute Monocytes: 738 cells/uL (ref 200–950)
Basophils Absolute: 24 cells/uL (ref 0–200)
Basophils Relative: 0.2 %
Eosinophils Absolute: 0 cells/uL — ABNORMAL LOW (ref 15–500)
Eosinophils Relative: 0 %
HCT: 43.5 % (ref 38.5–50.0)
Hemoglobin: 14.5 g/dL (ref 13.2–17.1)
Lymphs Abs: 1083 cells/uL (ref 850–3900)
MCH: 31.7 pg (ref 27.0–33.0)
MCHC: 33.3 g/dL (ref 32.0–36.0)
MCV: 95.2 fL (ref 80.0–100.0)
MPV: 9.9 fL (ref 7.5–12.5)
Monocytes Relative: 6.2 %
Neutro Abs: 10056 cells/uL — ABNORMAL HIGH (ref 1500–7800)
Neutrophils Relative %: 84.5 %
Platelets: 318 10*3/uL (ref 140–400)
RBC: 4.57 10*6/uL (ref 4.20–5.80)
RDW: 12.8 % (ref 11.0–15.0)
Total Lymphocyte: 9.1 %
WBC: 11.9 10*3/uL — ABNORMAL HIGH (ref 3.8–10.8)

## 2020-08-13 LAB — COMPLETE METABOLIC PANEL WITH GFR
AG Ratio: 1.4 (calc) (ref 1.0–2.5)
ALT: 54 U/L — ABNORMAL HIGH (ref 9–46)
AST: 50 U/L — ABNORMAL HIGH (ref 10–35)
Albumin: 4.2 g/dL (ref 3.6–5.1)
Alkaline phosphatase (APISO): 81 U/L (ref 35–144)
BUN: 18 mg/dL (ref 7–25)
CO2: 25 mmol/L (ref 20–32)
Calcium: 9.9 mg/dL (ref 8.6–10.3)
Chloride: 99 mmol/L (ref 98–110)
Creat: 0.8 mg/dL (ref 0.70–1.25)
GFR, Est African American: 111 mL/min/{1.73_m2} (ref >=60)
GFR, Est Non African American: 96 mL/min/{1.73_m2} (ref >=60)
Globulin: 3 g/dL (calc) (ref 1.9–3.7)
Glucose, Bld: 116 mg/dL — ABNORMAL HIGH (ref 65–99)
Potassium: 5.2 mmol/L (ref 3.5–5.3)
Sodium: 135 mmol/L (ref 135–146)
Total Bilirubin: 0.5 mg/dL (ref 0.2–1.2)
Total Protein: 7.2 g/dL (ref 6.1–8.1)

## 2020-08-13 LAB — HEPATITIS C RNA QUANTITATIVE
HCV Quantitative Log: <1.18 log IU/mL
HCV RNA, PCR, QN: <15 IU/mL

## 2020-08-13 LAB — RHEUMATOID FACTOR: Rheumatoid fact SerPl-aCnc: 25 IU/mL — ABNORMAL HIGH (ref <14)

## 2020-08-13 LAB — IFE INTERPRETATION: Immunofix Electr Int: DETECTED

## 2020-08-13 LAB — PROTEIN ELECTROPHORESIS, SERUM, WITH REFLEX
Albumin ELP: 4.1 g/dL (ref 3.8–4.8)
Alpha 1: 0.4 g/dL — ABNORMAL HIGH (ref 0.2–0.3)
Alpha 2: 1 g/dL — ABNORMAL HIGH (ref 0.5–0.9)
Beta 2: 0.4 g/dL (ref 0.2–0.5)
Beta Globulin: 0.5 g/dL (ref 0.4–0.6)
Gamma Globulin: 0.9 g/dL (ref 0.8–1.7)
Total Protein: 7.3 g/dL (ref 6.1–8.1)

## 2020-08-13 LAB — C-REACTIVE PROTEIN: CRP: 4.4 mg/L (ref <8.0)

## 2020-08-13 LAB — CYCLIC CITRUL PEPTIDE ANTIBODY, IGG: Cyclic Citrullin Peptide Ab: <16 UNITS

## 2020-08-13 LAB — SEDIMENTATION RATE: Sed Rate: 36 mm/h — ABNORMAL HIGH (ref 0–20)

## 2020-08-13 LAB — HEPATITIS B SURFACE ANTIGEN: Hepatitis B Surface Ag: NONREACTIVE

## 2020-08-13 LAB — HEPATITIS B CORE ANTIBODY, IGM: Hep B C IgM: NONREACTIVE

## 2020-08-14 ENCOUNTER — Telehealth: Payer: Self-pay | Admitting: Radiology

## 2020-08-14 DIAGNOSIS — M353 Polymyalgia rheumatica: Secondary | ICD-10-CM

## 2020-08-14 MED ORDER — PREDNISONE 10 MG PO TABS: 20.0000 mg | ORAL_TABLET | Freq: Every day | ORAL | 0 refills | Status: DC

## 2020-08-14 NOTE — Telephone Encounter (Signed)
Received VM from patient asking for lab results, labs collected on 08/08/2020 are resulted in Epic.

## 2020-08-14 NOTE — Progress Notes (Signed)
Addressed in phone note 08/14/20

## 2020-08-27 NOTE — Progress Notes (Deleted)
Office Visit Note  Patient: Victor Ramirez             Date of Birth: August 05, 1955           MRN: [id number]             PCP: Wilfrid Lund, PA Referring: Wilfrid Lund, [place] Visit Date: 08/28/2020   Subjective:  No chief complaint on file.   History of Present Illness: Victor Ramirez is a 65 y.o. male here for follow up ***     No Rheumatology ROS completed.   PMFS History:  Patient Active Problem List   Diagnosis Date Noted   Polymyalgia rheumatica (HCC) 08/08/2020   Polyarthralgia 08/08/2020   High risk medication use 08/08/2020   Lumbosacral spondylosis with radiculopathy 11/27/2016   Atrophy of muscle of right lower leg 02/04/2014   History of post-polio syndrome 02/02/2014   Pain of left lower extremity 02/02/2014   Chronic back pain 05/14/2013   HTN (hypertension) 05/14/2013   Headache 12/22/2011   Sleep apnea 11/17/2011   Elevation of level of transaminase or lactic acid dehydrogenase (LDH) 09/18/2010   Anxiety 09/08/2010   Tachycardia 09/08/2010   Hyperlipemia 03/01/2006   TIA (transient ischemic attack) 03/01/2006   Tobacco use disorder 03/01/2006   Depressive disorder 10/16/2005    Past Medical History:  Diagnosis Date   Hemochromatosis    Hypertension     Family History  Problem Relation Age of Onset   Arthritis Mother    Thyroid cancer Mother    Melanoma Father    Fibromyalgia Sister    Cancer Brother    Prostate cancer Brother    Williams syndrome Son    Past Surgical History:  Procedure Laterality Date   ANTERIOR LATERAL LUMBAR FUSION WITH PERCUTANEOUS SCREW 1 LEVEL Right 11/27/2016   Procedure: Lumbar Three-Four Transpsoas Lumbar InterbodyFfusion;  Surgeon: Ditty, Loura Halt, MD;  Location: [facility] OR;  Service: Neurosurgery;  Laterality: Right;  L3-4 Transpsoas lumbar interbody fusion/L3-4 Pedicle screw fixation with posterolateral arthrodesis/minimally invasive decompression/Mazor   APPLICATION OF ROBOTIC ASSISTANCE FOR SPINAL PROCEDURE  N/A 11/27/2016   Procedure: Lumbar Three-Four Pedicle Screw Fixation with Posterolateral Arthrodesis with Minimally Invasive Decompression with Application of Robotic Assistance;  Surgeon: Ditty, Loura Halt, MD;  Location: [facility] [place] OR;  Service: Neurosurgery;  Laterality: N/A;   BACK SURGERY     x3    BACK SURGERY     battery surgery    spinal manipulation under anesthesia     Social History   Social History Narrative   Not on file    There is no immunization history on file for this patient.   Objective: Vital Signs: There were no vitals taken for this visit.   Physical Exam   Musculoskeletal Exam: ***  CDAI Exam: CDAI Score: -- Patient Global: --; Provider Global: -- Swollen: --; Tender: -- Joint Exam 08/28/2020   No joint exam has been documented for this visit   There is currently no information documented on the homunculus. Go to the Rheumatology activity and complete the homunculus joint exam.  Investigation: No additional findings.  Imaging: No results found.  Recent Labs: Lab Results  Component Value Date   WBC 11.9 (H) 08/08/2020   HGB 14.5 08/08/2020   PLT 318 08/08/2020   NA 135 08/08/2020   K 5.2 08/08/2020   CL 99 08/08/2020   CO2 25 08/08/2020   GLUCOSE 116 (H) 08/08/2020   BUN 18 08/08/2020   CREATININE 0.80 08/08/2020  BILITOT 0.5 08/08/2020   ALKPHOS 53 11/19/2010   AST 50 (H) 08/08/2020   ALT 54 (H) 08/08/2020   PROT 7.3 08/08/2020   PROT 7.2 08/08/2020   ALBUMIN 4.5 11/19/2010   CALCIUM 9.9 08/08/2020   GFRAA 111 08/08/2020    Speciality Comments: No specialty comments available.  Procedures:  No procedures performed Allergies: Patient has no known allergies.   Assessment / Plan:     Visit Diagnoses: No diagnosis found.  ***  Orders: No orders of the defined types were placed in this encounter.  No orders of the defined types were placed in this encounter.    Follow-Up Instructions: No follow-ups on  file.   Fuller Plan, MD  Note - This record has been created using [employer].  Chart creation errors have been sought, but may not always  have been located. Such creation errors do not reflect on  the standard of medical care.

## 2020-08-28 ENCOUNTER — Ambulatory Visit: Payer: Medicare Other | Admitting: Internal Medicine

## 2020-08-29 ENCOUNTER — Ambulatory Visit (INDEPENDENT_AMBULATORY_CARE_PROVIDER_SITE_OTHER): Payer: Medicare Other | Admitting: Internal Medicine

## 2020-08-29 ENCOUNTER — Encounter: Payer: Self-pay | Admitting: Internal Medicine

## 2020-08-29 ENCOUNTER — Other Ambulatory Visit: Payer: Self-pay | Admitting: Internal Medicine

## 2020-08-29 ENCOUNTER — Other Ambulatory Visit: Payer: Self-pay

## 2020-08-29 VITALS — BP 122/83 | HR 96 | Ht 67.0 in | Wt 186.4 lb

## 2020-08-29 DIAGNOSIS — M353 Polymyalgia rheumatica: Secondary | ICD-10-CM

## 2020-08-29 DIAGNOSIS — R768 Other specified abnormal immunological findings in serum: Secondary | ICD-10-CM

## 2020-08-29 DIAGNOSIS — Z79899 Other long term (current) drug therapy: Secondary | ICD-10-CM | POA: Diagnosis not present

## 2020-08-29 MED ORDER — METHOTREXATE 2.5 MG PO TABS: 15.0000 mg | ORAL_TABLET | ORAL | 0 refills | Status: DC

## 2020-08-29 MED ORDER — FOLIC ACID 1 MG PO TABS
1.0000 mg | ORAL_TABLET | Freq: Every day | ORAL | 0 refills | Status: DC
Start: 2020-08-29 — End: 2020-11-28

## 2020-08-29 NOTE — Patient Instructions (Signed)
I recommend starting to try tapering down the prednisone dose. You can decrease this incrementally try going to 15 mg to 10 mg if symptoms remain reasonably well controlled for 2 weeks after changing. You can go back a step if it worsens severely. Also start taking methotrexate 6 tablets once weekly this also treats inflammation and can help control symptoms while reducing prednisone.  Methotrexate Tablets What is this medication? METHOTREXATE (METH oh TREX ate) treats inflammatory conditions such as arthritis and psoriasis. It works by decreasing inflammation, which can reduce pain and prevent long-term injury to the joints and skin. It may also be used to treat some types of cancer. It works by slowing down the growth of cancercells. This medicine may be used for other purposes; ask your health care provider orpharmacist if you have questions. COMMON BRAND NAME(S): Rheumatrex, Trexall What should I tell my care team before I take this medication? They need to know if you have any of these conditions: Fluid in the stomach area or lungs If you often drink alcohol Infection or immune system problems Kidney disease or on hemodialysis Liver disease Low blood counts, like low white cell, platelet, or red cell counts Lung disease Radiation therapy Stomach ulcers Ulcerative colitis An unusual or allergic reaction to methotrexate, other medications, foods, dyes, or preservatives Pregnant or trying to get pregnant Breast-feeding How should I use this medication? Take this medication by mouth with a glass of water. Follow the directions on the prescription label. Take your medication at regular intervals. Do not take it more often than directed. Do not stop taking except on your care team'sadvice. Make sure you know why you are taking this medication and how often you should take it. If this medication is used for a condition that is not cancer, like arthritis or psoriasis, it should be taken weekly,  NOT daily. Taking thismedication more often than directed can cause serious side effects, even death. Talk to your care team about safe handling and disposal of this medication. Youmay need to take special precautions. Talk to your care team about the use of this medication in children. While thismedication may be prescribed for selected conditions, precautions do apply. Overdosage: If you think you have taken too much of this medicine contact apoison control center or emergency room at once. NOTE: This medicine is only for you. Do not share this medicine with others. What if I miss a dose? If you miss a dose, talk with your care team. Do not take double or extra doses. What may interact with this medication? Do not take this medication with any of the following: Acitretin This medication may also interact with the following: Aspirin and aspirin-like medications including salicylates Azathioprine Certain antibiotics like penicillins, tetracycline, and chloramphenicol Certain medications that treat or prevent blood clots like warfarin, apixaban, dabigatran, and rivaroxaban Certain medications for stomach problems like esomeprazole, omeprazole, pantoprazole Cyclosporine Dapsone Diuretics Gold Hydroxychloroquine Live virus vaccines Medications for infection like acyclovir, adefovir, amphotericin B, bacitracin, cidofovir, foscarnet, ganciclovir, gentamicin, pentamidine, vancomycin Mercaptopurine NSAIDs, medications for pain and inflammation, like ibuprofen or naproxen Other cytotoxic agents Pamidronate Pemetrexed Penicillamine Phenylbutazone Phenytoin Probenecid Pyrimethamine Retinoids such as isotretinoin and tretinoin Steroid medications like prednisone or cortisone Sulfonamides like sulfasalazine and trimethoprim/sulfamethoxazole Theophylline Zoledronic acid This list may not describe all possible interactions. Give your health care provider a list of all the medicines, herbs,  non-prescription drugs, or dietary supplements you use. Also tell them if you smoke, drink alcohol, or use illegaldrugs. Some items  may interact with your medicine. What should I watch for while using this medication? Avoid alcoholic drinks. This medication can make you more sensitive to the sun. Keep out of the sun. If you cannot avoid being in the sun, wear protective clothing and use sunscreen.Do not use sun lamps or tanning beds/booths. You may need blood work done while you are taking this medication. Call your care team for advice if you get a fever, chills or sore throat, or other symptoms of a cold or flu. Do not treat yourself. This medication decreases your body's ability to fight infections. Try to avoid being aroundpeople who are sick. This medication may increase your risk to bruise or bleed. Call your care teamif you notice any unusual bleeding. Be careful brushing or flossing your teeth or using a toothpick because you may get an infection or bleed more easily. If you have any dental work done, Estate agent you are receiving this medication. Check with your care team if you get an attack of severe diarrhea, nausea and vomiting, or if you sweat a lot. The loss of too much body fluid can make itdangerous for you to take this medication. Talk to your care team about your risk of cancer. You may be more at risk forcertain types of cancers if you take this medication. Do not become pregnant while taking this medication or for 6 months after stopping it. Women should inform their care team if they wish to become pregnant or think they might be pregnant. Men should not father a child while taking this medication and for 3 months after stopping it. There is potential for serious harm to an unborn child. Talk to your care team for more information. Do not breast-feed an infant while taking this medication or for 1week after stopping it. This medication may make it more difficult to get pregnant  or father a child.Talk to your care team if you are concerned about your fertility. What side effects may I notice from receiving this medication? Side effects that you should report to your care team as soon as possible: Allergic reactions-skin rash, itching, hives, swelling of the face, lips, tongue, or throat Blood clot-pain, swelling, or warmth in the leg, shortness of breath, chest pain Dry cough, shortness of breath or trouble breathing Infection-fever, chills, cough, sore throat, wounds that don't heal, pain or trouble when passing urine, general feeling of discomfort or being unwell Kidney injury-decrease in the amount of urine, swelling of the ankles, hands, or feet Liver injury-right upper belly pain, loss of appetite, nausea, light-colored stool, dark yellow or brown urine, yellowing of the skin or eyes, unusual weakness or fatigue Low red blood cell count-unusual weakness or fatigue, dizziness, headache, trouble breathing Redness, blistering, peeling, or loosening of the skin, including inside the mouth Seizures Unusual bruising or bleeding Side effects that usually do not require medical attention (report to your careteam if they continue or are bothersome): Diarrhea Dizziness Hair loss Nausea Pain, redness, or swelling with sores inside the mouth or throat Vomiting This list may not describe all possible side effects. Call your doctor for medical advice about side effects. You may report side effects to FDA at1-800-FDA-1088. Where should I keep my medication? Keep out of the reach of children and pets. Store at room temperature between 20 and 25 degrees C (68 and 77 degrees F).Protect from light. Get rid of any unused medication after the expiration date. Talk to your care team about how to dispose of unused medication.  Specialdirections may apply. NOTE: This sheet is a summary. It may not cover all possible information. If you have questions about this medicine, talk to your  doctor, pharmacist, orhealth care provider.  2022 [employer]vier/Gold Standard (2020-03-25 15:55:45)

## 2020-08-29 NOTE — Progress Notes (Signed)
Office Visit Note  Patient: Victor Ramirez             Date of Birth: 11-02-1955           MRN: [id number]             PCP: Wilfrid Lund, PA Referring: Wilfrid Lund, [place] Visit Date: 08/29/2020   Subjective:  Follow-up (Patient feels as if symptoms have improved with Prednisone. Patient is taking Prednisone 20 mg daily. )   History of Present Illness: Victor Ramirez is a 65 y.o. male here for follow up for PMR currently on prednisone 20 mg daily.  He has now been on steroid treatment for about 3 weeks after the onset of his symptoms and feels these are mostly improved with the medication.  He continues having some pain and soreness particularly in the middle of his upper back and bilateral shoulders but has normal strength and range of motion.  He also continues to have no feeling like the symptoms are localizing in areas of weakness or prior injury particularly of the right leg.  Lab test at his initial visit showed inflammatory markers were mostly improved on the prednisone although notably he was positive for rheumatoid factor test.  Previous HPI: 08/08/20 Victor Ramirez is a 65 y.o. male referred for PMR. His symptoms started abruptly with severe pain in bilateral shoulders limiting use and mobility especially first thing in the morning.  He does not require any preceding injury or changes in health or medications. For evaluation and primary care clinic with x-ray imaging obtained that did not show any structural cause of symptoms he had markedly high inflammatory markers and was started on oral prednisone for suspected PMR.  He noticed dramatic improvement in symptoms within 2 days of starting the medicine.  At follow-up he was recommended to decrease the dose down to 15 mg of prednisone at which time he noticed a return of his symptoms.  He increased back to 20 mg and feels this is doing well again.  The most affected areas were in the base of his neck and bilateral upper back and  shoulders with radiation down the upper portion of the arms.  He also felt like it was affecting his right hip and thigh much worse than left, where he has chronic deficits due to history of polio.  He has started to notice some weight gain with the oral prednisone otherwise no specific side effect problems.  Review of Systems  Constitutional:  Negative for fatigue.  HENT:  Negative for mouth sores, mouth dryness and nose dryness.   Eyes:  Negative for pain, itching, visual disturbance and dryness.  Respiratory:  Negative for cough, hemoptysis, shortness of breath and difficulty breathing.   Cardiovascular:  Negative for chest pain, palpitations and swelling in legs/feet.  Gastrointestinal:  Negative for abdominal pain, blood in stool, constipation and diarrhea.  Endocrine: Negative for increased urination.  Genitourinary:  Negative for painful urination.  Musculoskeletal:  Positive for joint pain, joint pain, myalgias, muscle weakness, morning stiffness, muscle tenderness and myalgias. Negative for joint swelling.  Skin:  Negative for color change, rash and redness.  Allergic/Immunologic: Negative for susceptible to infections.  Neurological:  Negative for dizziness, numbness, headaches, memory loss and weakness.  Hematological:  Negative for swollen glands.  Psychiatric/Behavioral:  Positive for sleep disturbance. Negative for confusion.    PMFS History:  Patient Active Problem List   Diagnosis Date Noted   Rheumatoid factor positive 08/29/2020  Polymyalgia rheumatica (HCC) 08/08/2020   Polyarthralgia 08/08/2020   High risk medication use 08/08/2020   Lumbosacral spondylosis with radiculopathy 11/27/2016   Atrophy of muscle of right lower leg 02/04/2014   History of post-polio syndrome 02/02/2014   Pain of left lower extremity 02/02/2014   Chronic back pain 05/14/2013   HTN (hypertension) 05/14/2013   Headache 12/22/2011   Sleep apnea 11/17/2011   Elevation of level of  transaminase or lactic acid dehydrogenase (LDH) 09/18/2010   Anxiety 09/08/2010   Tachycardia 09/08/2010   Hyperlipemia 03/01/2006   TIA (transient ischemic attack) 03/01/2006   Tobacco use disorder 03/01/2006   Depressive disorder 10/16/2005    Past Medical History:  Diagnosis Date   Hemochromatosis    Hypertension     Family History  Problem Relation Age of Onset   Arthritis Mother    Thyroid cancer Mother    Melanoma Father    Fibromyalgia Sister    Cancer Brother    Prostate cancer Brother    Williams syndrome Son    Past Surgical History:  Procedure Laterality Date   ANTERIOR LATERAL LUMBAR FUSION WITH PERCUTANEOUS SCREW 1 LEVEL Right 11/27/2016   Procedure: Lumbar Three-Four Transpsoas Lumbar InterbodyFfusion;  Surgeon: Ditty, Loura Halt, MD;  Location: [facility] OR;  Service: Neurosurgery;  Laterality: Right;  L3-4 Transpsoas lumbar interbody fusion/L3-4 Pedicle screw fixation with posterolateral arthrodesis/minimally invasive decompression/Mazor   APPLICATION OF ROBOTIC ASSISTANCE FOR SPINAL PROCEDURE N/A 11/27/2016   Procedure: Lumbar Three-Four Pedicle Screw Fixation with Posterolateral Arthrodesis with Minimally Invasive Decompression with Application of Robotic Assistance;  Surgeon: Ditty, Loura Halt, MD;  Location: [facility] OR;  Service: Neurosurgery;  Laterality: N/A;   BACK SURGERY     x3    BACK SURGERY     battery surgery    spinal manipulation under anesthesia     Social History   Social History Narrative   Not on file    There is no immunization history on file for this patient.   Objective: Vital Signs: BP 122/83 (BP Location: Left Arm, Patient Position: Sitting, Cuff Size: Large)   Pulse 96   Ht 5\' 7"  (1.702 m)   Wt 186 lb 6.4 oz (84.6 kg)   BMI 29.19 kg/m    Physical Exam Skin:    General: Skin is warm and dry.     Findings: No rash.  Neurological:     General: No focal deficit present.     Mental Status: He is alert.  Psychiatric:        Mood  and Affect: Mood normal.     Musculoskeletal Exam:  Neck full ROM no tenderness Shoulders full range of motion bilaterally with tenderness to palpation over the lateral margin of the joint Elbows full ROM no tenderness or swelling Wrists full ROM no tenderness or swelling Fingers full ROM no tenderness or swelling Knees full ROM no tenderness or swelling Ankles full ROM no tenderness or swelling    Investigation: No additional findings.  Imaging: No results found.  Recent Labs: Lab Results  Component Value Date   WBC 11.9 (H) 08/08/2020   HGB 14.5 08/08/2020   PLT 318 08/08/2020   NA 135 08/08/2020   K 5.2 08/08/2020   CL 99 08/08/2020   CO2 25 08/08/2020   GLUCOSE 116 (H) 08/08/2020   BUN 18 08/08/2020   CREATININE 0.80 08/08/2020   BILITOT 0.5 08/08/2020   ALKPHOS 53 11/19/2010   AST 50 (H) 08/08/2020   ALT 54 (H) 08/08/2020  PROT 7.3 08/08/2020   PROT 7.2 08/08/2020   ALBUMIN 4.5 11/19/2010   CALCIUM 9.9 08/08/2020   GFRAA 111 08/08/2020    Speciality Comments: No specialty comments available.  Procedures:  No procedures performed Allergies: Patient has no known allergies.   Assessment / Plan:     Visit Diagnoses: Polymyalgia rheumatica (HCC) - Plan: folic acid (FOLVITE) 1 MG tablet, DISCONTINUED: methotrexate (RHEUMATREX) 2.5 MG tablet  PMR symptoms seem mostly improved on the initial prednisone I recommend he start trying to taper this medication down by 5 mg increments starting at 15 mg daily dose during this week and no faster than 1 step every 2 weeks.  Recommend starting methotrexate 15 mg by mouth weekly for control of inflammation while tapering the prednisone.  His positive rheumatoid factor test may or may not predict for responsiveness to traditional DMARDs discussed possibility of chronic arthritis initially presenting with PMR.  High risk medication use - Plan: DG Chest 2 View  Plan to start methotrexate treatment he had normal metabolic  function and negative hepatitis screening.  No recent chest x-ray on file he has no pulmonary disease history we will send for chest x-ray baseline screening for pulmonary nodules or ILD.  Will need lab follow-up within a month of new methotrexate start.  Rheumatoid factor positive  Positive rheumatoid factor not an exceptionally high titer but family history of RA and with PMR symptoms this is suspicious.  Current high dose of prednisone could mask peripheral inflammatory changes.  Currently treating inflammation as detailed above.  Orders: Orders Placed This Encounter  Procedures   DG Chest 2 View    Meds ordered this encounter  Medications   DISCONTD: methotrexate (RHEUMATREX) 2.5 MG tablet    Sig: Take 6 tablets (15 mg total) by mouth once a week. Caution:Chemotherapy. Protect from light.    Dispense:  30 tablet    Refill:  0   folic acid (FOLVITE) 1 MG tablet    Sig: Take 1 tablet (1 mg total) by mouth daily.    Dispense:  90 tablet    Refill:  0      Follow-Up Instructions: Return in about 4 weeks (around 09/26/2020) for PMR GC+MTX start f./u 4wks.   Fuller Plan, MD  Note - This record has been created using [employer].  Chart creation errors have been sought, but may not always  have been located. Such creation errors do not reflect on  the standard of medical care.

## 2020-09-03 ENCOUNTER — Other Ambulatory Visit: Payer: Self-pay | Admitting: Radiology

## 2020-09-03 ENCOUNTER — Telehealth: Payer: Self-pay

## 2020-09-03 NOTE — Telephone Encounter (Signed)
Patient requested a return call regarding his medication list.  Patient states he stopped taking his Baclofen a couple of years ago.  Patient states Rosuvastatin 5 mg is not listed and there are no instructions regarding his Methocarbamol that his PCP prescribes.

## 2020-09-03 NOTE — Telephone Encounter (Signed)
Spoke with patient, discussed changes / discrepancies with medication list. Advised patient some medications may show up on the list although we've marked "not taking". Medication has been updated to the best of my ability and patient is aware.

## 2020-09-11 ENCOUNTER — Other Ambulatory Visit: Payer: Self-pay

## 2020-09-11 ENCOUNTER — Ambulatory Visit
Admission: RE | Admit: 2020-09-11 | Discharge: 2020-09-11 | Disposition: A | Discharge status: Home / Self Care | Payer: Medicare Other | Source: Ambulatory Visit | Attending: Internal Medicine | Admitting: Internal Medicine

## 2020-09-11 DIAGNOSIS — R768 Other specified abnormal immunological findings in serum: Secondary | ICD-10-CM | POA: Diagnosis not present

## 2020-09-11 DIAGNOSIS — G8929 Other chronic pain: Secondary | ICD-10-CM | POA: Diagnosis not present

## 2020-09-11 DIAGNOSIS — Z79899 Other long term (current) drug therapy: Secondary | ICD-10-CM

## 2020-09-11 DIAGNOSIS — M353 Polymyalgia rheumatica: Secondary | ICD-10-CM | POA: Diagnosis not present

## 2020-09-11 DIAGNOSIS — F321 Major depressive disorder, single episode, moderate: Secondary | ICD-10-CM | POA: Diagnosis not present

## 2020-09-11 DIAGNOSIS — Z139 Encounter for screening, unspecified: Secondary | ICD-10-CM | POA: Diagnosis not present

## 2020-09-29 NOTE — Progress Notes (Signed)
Office Visit Note  Patient: Victor Ramirez             Date of Birth: 06-02-1955           MRN: [id number]             PCP: Lois Huxley, PA Referring: Lois Huxley, [place] Visit Date: 09/30/2020   Subjective:  Follow-up (Patient is currently taking Prednisone 10 mg daily and MTX 15 mg weekly. )   History of Present Illness: Victor Ramirez is a 65 y.o. male here for follow up for PMR after starting methotrexate 15 mg PO weekly and continuing prednisone with taper from 20 mg down to 10 mg over the past month. He is down to 10 mg since last Thursday and did feel increased pain and stiffness Friday and over the weekend, but symptoms are not too bad today. He rates good days as about a 2 but alternates with days he is stiff all over but especially in the back around base of the neck and in both shoulders. He did experienced some wrist stiffness down the right arm which is new. He has increased bruising over both forearms which is new.  Previous HPI: 08/29/20 Victor Ramirez is a 65 y.o. male here for follow up for PMR currently on prednisone 20 mg daily.  He has now been on steroid treatment for about 3 weeks after the onset of his symptoms and feels these are mostly improved with the medication.  He continues having some pain and soreness particularly in the middle of his upper back and bilateral shoulders but has normal strength and range of motion.  He also continues to have no feeling like the symptoms are localizing in areas of weakness or prior injury particularly of the right leg.  Lab test at his initial visit showed inflammatory markers were mostly improved on the prednisone although notably he was positive for rheumatoid factor test.   08/08/20 Victor Ramirez is a 65 y.o. male referred for PMR. His symptoms started abruptly with severe pain in bilateral shoulders limiting use and mobility especially first thing in the morning.  He does not require any preceding injury or changes in  health or medications. For evaluation and primary care clinic with x-ray imaging obtained that did not show any structural cause of symptoms he had markedly high inflammatory markers and was started on oral prednisone for suspected PMR.  He noticed dramatic improvement in symptoms within 2 days of starting the medicine.  At follow-up he was recommended to decrease the dose down to 15 mg of prednisone at which time he noticed a return of his symptoms.  He increased back to 20 mg and feels this is doing well again.  The most affected areas were in the base of his neck and bilateral upper back and shoulders with radiation down the upper portion of the arms.  He also felt like it was affecting his right hip and thigh much worse than left, where he has chronic deficits due to history of polio.  He has started to notice some weight gain with the oral prednisone otherwise no specific side effect problems.  Review of Systems  Constitutional:  Positive for fatigue.  HENT:  Negative for mouth sores, mouth dryness and nose dryness.   Eyes:  Negative for pain, itching, visual disturbance and dryness.  Respiratory:  Negative for cough, hemoptysis, shortness of breath and difficulty breathing.   Cardiovascular:  Negative for chest pain, palpitations  and swelling in legs/feet.  Gastrointestinal:  Negative for abdominal pain, blood in stool, constipation and diarrhea.  Endocrine: Negative for increased urination.  Genitourinary:  Negative for painful urination.  Musculoskeletal:  Positive for joint pain, joint pain, joint swelling, myalgias, muscle weakness, morning stiffness, muscle tenderness and myalgias.  Skin:  Negative for color change, rash and redness.  Allergic/Immunologic: Negative for susceptible to infections.  Neurological:  Positive for dizziness and weakness. Negative for numbness, headaches and memory loss.  Hematological:  Negative for swollen glands.  Psychiatric/Behavioral:  Positive for sleep  disturbance. Negative for confusion.    PMFS History:  Patient Active Problem List   Diagnosis Date Noted   Rheumatoid factor positive 08/29/2020   Polymyalgia rheumatica ([place]) 08/08/2020   Polyarthralgia 08/08/2020   High risk medication use 08/08/2020   Lumbosacral spondylosis with radiculopathy 11/27/2016   Atrophy of muscle of right lower leg 02/04/2014   History of post-polio syndrome 02/02/2014   Pain of left lower extremity 02/02/2014   Chronic back pain 05/14/2013   HTN (hypertension) 05/14/2013   Headache 12/22/2011   Sleep apnea 11/17/2011   Elevation of level of transaminase or lactic acid dehydrogenase (LDH) 09/18/2010   Anxiety 09/08/2010   Tachycardia 09/08/2010   Hyperlipemia 03/01/2006   TIA (transient ischemic attack) 03/01/2006   Tobacco use disorder 03/01/2006   Depressive disorder 10/16/2005    Past Medical History:  Diagnosis Date   Hemochromatosis    Hypertension     Family History  Problem Relation Age of Onset   Arthritis Mother    Thyroid cancer Mother    Melanoma Father    Fibromyalgia Sister    Cancer Brother    Prostate cancer Brother    Williams syndrome Son    Past Surgical History:  Procedure Laterality Date   ANTERIOR LATERAL LUMBAR FUSION WITH PERCUTANEOUS SCREW 1 LEVEL Right 11/27/2016   Procedure: Lumbar Three-Four Transpsoas Lumbar InterbodyFfusion;  Surgeon: Ditty, Kevan Ny, MD;  Location: [place];  Service: Neurosurgery;  Laterality: Right;  L3-4 Transpsoas lumbar interbody fusion/L3-4 Pedicle screw fixation with posterolateral arthrodesis/minimally invasive decompression/Mazor   APPLICATION OF ROBOTIC ASSISTANCE FOR SPINAL PROCEDURE N/A 11/27/2016   Procedure: Lumbar Three-Four Pedicle Screw Fixation with Posterolateral Arthrodesis with Minimally Invasive Decompression with Application of Robotic Assistance;  Surgeon: Ditty, Kevan Ny, MD;  Location: [place];  Service: Neurosurgery;  Laterality: N/A;   BACK SURGERY     x3     BACK SURGERY     battery surgery    spinal manipulation under anesthesia     Social History   Social History Narrative   Not on file    There is no immunization history on file for this patient.   Objective: Vital Signs: BP (!) 130/92 (BP Location: Left Arm, Patient Position: Sitting, Cuff Size: Normal)   Pulse (!) 105   Ht '5\' 8"'  (1.727 m)   Wt 187 lb (84.8 kg)   BMI 28.43 kg/m    Physical Exam Skin:    General: Skin is warm and dry.     Comments: Patches of ecchymoses over forearm extensor surfaces b/l  Neurological:     Mental Status: He is alert.     Comments: Right knee decreased extension strength and knee jerk reflex, otherwise normal strength throughout  Psychiatric:        Mood and Affect: Mood normal.     Musculoskeletal Exam:  Neck full ROM, mild tenderness to pressure around level of C5 to C7 Shoulders full ROM anterior  pain with reaching behind low back b/l, no palpable swelling or focal tenderness Elbows full ROM no tenderness or swelling Wrists full ROM no tenderness or swelling Fingers full ROM no tenderness or swelling Knees full ROM no tenderness or swelling, crepitus present Ankles full ROM no tenderness or swelling   Investigation: No additional findings.  Imaging: DG Chest 2 View  Result Date: 09/12/2020 CLINICAL DATA:  65 year old male under evaluation prior to methotrexate treatment. Baseline examination. EXAM: CHEST - 2 VIEW COMPARISON:  No priors. FINDINGS: Lung volumes are normal. No consolidative airspace disease. No pleural effusions. No pneumothorax. No pulmonary nodule or mass noted. Pulmonary vasculature and the cardiomediastinal silhouette are within normal limits. Orthopedic fixation hardware in the lumbar spine incompletely imaged. IMPRESSION: No radiographic evidence of acute cardiopulmonary disease. Electronically Signed   By: Vinnie Langton M.D.   On: 09/12/2020 13:09    Recent Labs: Lab Results  Component Value Date   WBC 11.9  (H) 08/08/2020   HGB 14.5 08/08/2020   PLT 318 08/08/2020   NA 135 08/08/2020   K 5.2 08/08/2020   CL 99 08/08/2020   CO2 25 08/08/2020   GLUCOSE 116 (H) 08/08/2020   BUN 18 08/08/2020   CREATININE 0.80 08/08/2020   BILITOT 0.5 08/08/2020   ALKPHOS 53 11/19/2010   AST 50 (H) 08/08/2020   ALT 54 (H) 08/08/2020   PROT 7.3 08/08/2020   PROT 7.2 08/08/2020   ALBUMIN 4.5 11/19/2010   CALCIUM 9.9 08/08/2020   GFRAA 111 08/08/2020    Speciality Comments: No specialty comments available.  Procedures:  No procedures performed Allergies: Patient has no known allergies.   Assessment / Plan:     Visit Diagnoses: Polymyalgia rheumatica ([place]) - Plan: Sedimentation rate, predniSONE (DELTASONE) 5 MG tablet  PMR symptoms appear reasonably controlled today on prednisone 10 mg daily and methotrexate 15 mg PO weekly. No objective peripheral joint inflammation. Checking ESR again today previously mildly high at 36. Plan to continue current MTX dose recommend he keep on current prednisone, can try decrease to 7.5 mg after 1 month if feeling well with plan to f/u 8 wks.  High risk medication use - Plan: CBC with Differential/Platelet, COMPLETE METABOLIC PANEL WITH GFR  Methotrexate toxicity monitoring we will check CBC and CMP today. He has had intermittent AST and ALT variation at baseline so would consider partial dose reduction and rechecking if abnormal.   Orders: Orders Placed This Encounter  Procedures   Sedimentation rate   CBC with Differential/Platelet   COMPLETE METABOLIC PANEL WITH GFR    Meds ordered this encounter  Medications   predniSONE (DELTASONE) 5 MG tablet    Sig: Take 2 tablets (10 mg total) by mouth daily with breakfast for 30 days, THEN 1.5 tablets (7.5 mg total) daily with breakfast.    Dispense:  60 tablet    Refill:  1      Follow-Up Instructions: Return in about 8 weeks (around 11/25/2020) for PMR on MTX+GCs f/u 8 wks.   Collier Salina, MD  Note -  This record has been created using [employer].  Chart creation errors have been sought, but may not always  have been located. Such creation errors do not reflect on  the standard of medical care.

## 2020-09-30 ENCOUNTER — Ambulatory Visit: Payer: Medicare Other | Admitting: Internal Medicine

## 2020-09-30 ENCOUNTER — Other Ambulatory Visit: Payer: Self-pay

## 2020-09-30 ENCOUNTER — Encounter: Payer: Self-pay | Admitting: Internal Medicine

## 2020-09-30 VITALS — BP 130/92 | HR 105 | Ht 68.0 in | Wt 187.0 lb

## 2020-09-30 DIAGNOSIS — M353 Polymyalgia rheumatica: Secondary | ICD-10-CM

## 2020-09-30 DIAGNOSIS — Z79899 Other long term (current) drug therapy: Secondary | ICD-10-CM

## 2020-09-30 LAB — COMPLETE METABOLIC PANEL WITH GFR
AG Ratio: 1.7 (calc) (ref 1.0–2.5)
ALT: 68 U/L — ABNORMAL HIGH (ref 9–46)
AST: 70 U/L — ABNORMAL HIGH (ref 10–35)
Albumin: 4 g/dL (ref 3.6–5.1)
Alkaline phosphatase (APISO): 80 U/L (ref 35–144)
BUN: 11 mg/dL (ref 7–25)
CO2: 28 mmol/L (ref 20–32)
Calcium: 9.2 mg/dL (ref 8.6–10.3)
Chloride: 103 mmol/L (ref 98–110)
Creat: 0.78 mg/dL (ref 0.70–1.35)
Globulin: 2.3 g/dL (calc) (ref 1.9–3.7)
Glucose, Bld: 120 mg/dL — ABNORMAL HIGH (ref 65–99)
Potassium: 5.4 mmol/L — ABNORMAL HIGH (ref 3.5–5.3)
Sodium: 138 mmol/L (ref 135–146)
Total Bilirubin: 0.5 mg/dL (ref 0.2–1.2)
Total Protein: 6.3 g/dL (ref 6.1–8.1)
eGFR: 101 mL/min/{1.73_m2} (ref >=60)

## 2020-09-30 LAB — CBC WITH DIFFERENTIAL/PLATELET
Absolute Monocytes: 695 cells/uL (ref 200–950)
Basophils Absolute: 24 cells/uL (ref 0–200)
Basophils Relative: 0.2 %
Eosinophils Absolute: 12 cells/uL — ABNORMAL LOW (ref 15–500)
Eosinophils Relative: 0.1 %
HCT: 41.6 % (ref 38.5–50.0)
Hemoglobin: 13.7 g/dL (ref 13.2–17.1)
Lymphs Abs: 1330 cells/uL (ref 850–3900)
MCH: 32.3 pg (ref 27.0–33.0)
MCHC: 32.9 g/dL (ref 32.0–36.0)
MCV: 98.1 fL (ref 80.0–100.0)
MPV: 10.4 fL (ref 7.5–12.5)
Monocytes Relative: 5.7 %
Neutro Abs: 10138 cells/uL — ABNORMAL HIGH (ref 1500–7800)
Neutrophils Relative %: 83.1 %
Platelets: 281 10*3/uL (ref 140–400)
RBC: 4.24 10*6/uL (ref 4.20–5.80)
RDW: 14.7 % (ref 11.0–15.0)
Total Lymphocyte: 10.9 %
WBC: 12.2 10*3/uL — ABNORMAL HIGH (ref 3.8–10.8)

## 2020-09-30 LAB — SEDIMENTATION RATE: Sed Rate: 22 mm/h — ABNORMAL HIGH (ref 0–20)

## 2020-09-30 MED ORDER — PREDNISONE 5 MG PO TABS: ORAL_TABLET | ORAL | 1 refills | Status: DC

## 2020-10-01 NOTE — Addendum Note (Signed)
Addended by: Fuller Plan on: 10/01/2020 09:01 AM   Modules accepted: Orders

## 2020-10-01 NOTE — Progress Notes (Signed)
His sedimentation rate is decreased indicating less inflammation than a month ago. Unfortunately, liver enzyme tests are increased on the methotrexate so I recommend he stop this medication. We will have to continue with just the prednisone for now, I recommend to recheck this test 2-3 weeks after stopping the methotrexate to make sure it returns to normal.

## 2020-11-07 ENCOUNTER — Other Ambulatory Visit: Payer: Self-pay | Admitting: *Deleted

## 2020-11-07 DIAGNOSIS — Z79899 Other long term (current) drug therapy: Secondary | ICD-10-CM | POA: Diagnosis not present

## 2020-11-07 LAB — HEPATIC FUNCTION PANEL
AG Ratio: 1.9 (calc) (ref 1.0–2.5)
ALT: 48 U/L — ABNORMAL HIGH (ref 9–46)
AST: 38 U/L — ABNORMAL HIGH (ref 10–35)
Albumin: 4.1 g/dL (ref 3.6–5.1)
Alkaline phosphatase (APISO): 73 U/L (ref 35–144)
Bilirubin, Direct: 0.1 mg/dL (ref 0.0–0.2)
Globulin: 2.2 g/dL (calc) (ref 1.9–3.7)
Indirect Bilirubin: 0.3 mg/dL (calc) (ref 0.2–1.2)
Total Bilirubin: 0.4 mg/dL (ref 0.2–1.2)
Total Protein: 6.3 g/dL (ref 6.1–8.1)

## 2020-11-08 NOTE — Progress Notes (Signed)
Liver function tests show improvement after stopping the methotrexate, not back to completely normal but back to what looks like Victor Ramirez baseline. We will continue with just the prednisone treatment for now.

## 2020-11-24 NOTE — Progress Notes (Deleted)
Office Visit Note  Patient: Victor Ramirez             Date of Birth: January 02, 1956           MRN: [id number]             PCP: Wilfrid Lund, PA Referring: Wilfrid Lund, [place] Visit Date: 11/25/2020   Subjective:  No chief complaint on file.   History of Present Illness: DERIK FULTS is a 65 y.o. male here for follow up for PMR on prednisone tapering goal of 7.5 mg PO daily. Methotrexate was stopped after last visit due to elevation in liver enzymes.  ***   Previous HPI 09/30/20 FRITZ CAUTHON is a 65 y.o. male here for follow up for PMR after starting methotrexate 15 mg PO weekly and continuing prednisone with taper from 20 mg down to 10 mg over the past month. He is down to 10 mg since last Thursday and did feel increased pain and stiffness Friday and over the weekend, but symptoms are not too bad today. He rates good days as about a 2 but alternates with days he is stiff all over but especially in the back around base of the neck and in both shoulders. He did experienced some wrist stiffness down the right arm which is new. He has increased bruising over both forearms which is new.   08/08/20 AEON KESSNER is a 65 y.o. male referred for PMR. His symptoms started abruptly with severe pain in bilateral shoulders limiting use and mobility especially first thing in the morning.  He does not require any preceding injury or changes in health or medications. For evaluation and primary care clinic with x-ray imaging obtained that did not show any structural cause of symptoms he had markedly high inflammatory markers and was started on oral prednisone for suspected PMR.  He noticed dramatic improvement in symptoms within 2 days of starting the medicine.  At follow-up he was recommended to decrease the dose down to 15 mg of prednisone at which time he noticed a return of his symptoms.  He increased back to 20 mg and feels this is doing well again.  The most affected areas were in the base of his  neck and bilateral upper back and shoulders with radiation down the upper portion of the arms.  He also felt like it was affecting his right hip and thigh much worse than left, where he has chronic deficits due to history of polio.  He has started to notice some weight gain with the oral prednisone otherwise no specific side effect problems.   No Rheumatology ROS completed.   PMFS History:  Patient Active Problem List   Diagnosis Date Noted   Rheumatoid factor positive 08/29/2020   Polymyalgia rheumatica (HCC) 08/08/2020   Polyarthralgia 08/08/2020   High risk medication use 08/08/2020   Lumbosacral spondylosis with radiculopathy 11/27/2016   Atrophy of muscle of right lower leg 02/04/2014   History of post-polio syndrome 02/02/2014   Pain of left lower extremity 02/02/2014   Chronic back pain 05/14/2013   HTN (hypertension) 05/14/2013   Headache 12/22/2011   Sleep apnea 11/17/2011   Elevation of level of transaminase or lactic acid dehydrogenase (LDH) 09/18/2010   Anxiety 09/08/2010   Tachycardia 09/08/2010   Hyperlipemia 03/01/2006   TIA (transient ischemic attack) 03/01/2006   Tobacco use disorder 03/01/2006   Depressive disorder 10/16/2005    Past Medical History:  Diagnosis Date   Hemochromatosis  Hypertension     Family History  Problem Relation Age of Onset   Arthritis Mother    Thyroid cancer Mother    Melanoma Father    Fibromyalgia Sister    Cancer Brother    Prostate cancer Brother    Williams syndrome Son    Past Surgical History:  Procedure Laterality Date   ANTERIOR LATERAL LUMBAR FUSION WITH PERCUTANEOUS SCREW 1 LEVEL Right 11/27/2016   Procedure: Lumbar Three-Four Transpsoas Lumbar InterbodyFfusion;  Surgeon: Ditty, Loura Halt, MD;  Location: [facility];  Service: Neurosurgery;  Laterality: Right;  L3-4 Transpsoas lumbar interbody fusion/L3-4 Pedicle screw fixation with posterolateral arthrodesis/minimally invasive decompression/Mazor   APPLICATION OF  ROBOTIC ASSISTANCE FOR SPINAL PROCEDURE N/A 11/27/2016   Procedure: Lumbar Three-Four Pedicle Screw Fixation with Posterolateral Arthrodesis with Minimally Invasive Decompression with Application of Robotic Assistance;  Surgeon: Ditty, Loura Halt, MD;  Location: [facility] OR;  Service: Neurosurgery;  Laterality: N/A;   BACK SURGERY     x3    BACK SURGERY     battery surgery    spinal manipulation under anesthesia     Social History   Social History Narrative   Not on file    There is no immunization history on file for this patient.   Objective: Vital Signs: There were no vitals taken for this visit.   Physical Exam   Musculoskeletal Exam: ***  CDAI Exam: CDAI Score: -- Patient Global: --; Provider Global: -- Swollen: --; Tender: -- Joint Exam 11/25/2020   No joint exam has been documented for this visit   There is currently no information documented on the homunculus. Go to the Rheumatology activity and complete the homunculus joint exam.  Investigation: No additional findings.  Imaging: No results found.  Recent Labs: Lab Results  Component Value Date   WBC 12.2 (H) 09/30/2020   HGB 13.7 09/30/2020   PLT 281 09/30/2020   NA 138 09/30/2020   K 5.4 (H) 09/30/2020   CL 103 09/30/2020   CO2 28 09/30/2020   GLUCOSE 120 (H) 09/30/2020   BUN 11 09/30/2020   CREATININE 0.78 09/30/2020   BILITOT 0.4 11/07/2020   ALKPHOS 53 11/19/2010   AST 38 (H) 11/07/2020   ALT 48 (H) 11/07/2020   PROT 6.3 11/07/2020   ALBUMIN 4.5 11/19/2010   CALCIUM 9.2 09/30/2020   GFRAA 111 08/08/2020    Speciality Comments: No specialty comments available.  Procedures:  No procedures performed Allergies: Patient has no known allergies.   Assessment / Plan:     Visit Diagnoses: No diagnosis found.  ***  Orders: No orders of the defined types were placed in this encounter.  No orders of the defined types were placed in this encounter.    Follow-Up Instructions: No follow-ups  on file.   Fuller Plan, MD  Note - This record has been created using [employer].  Chart creation errors have been sought, but may not always  have been located. Such creation errors do not reflect on  the standard of medical care.

## 2020-11-25 ENCOUNTER — Ambulatory Visit: Payer: Medicare Other | Admitting: Internal Medicine

## 2020-11-27 NOTE — Progress Notes (Signed)
Office Visit Note  Patient: Victor Ramirez             Date of Birth: 1956-01-19           MRN: [id number]             PCP: Wilfrid Lund, PA Referring: Wilfrid Lund, [place] Visit Date: 11/28/2020  Subjective:  History of Present Illness: Victor Ramirez is a 65 y.o. male here for follow up of PMR on prednisone 10 mg PO daily. He stopped methotrexate after our last visit due to abnormal LFT elevation that resolved after stopping. He feels symptoms are better and worse one day vs the next but overall slowly improving. He still has neck pain with rotation shooting down the right side rarely. His left shoulder is hurting more than before especially with reaching overhead, lying on his left side, reaching up to steering wheel.   Previous HPI: 09/30/20 Victor Ramirez is a 65 y.o. male here for follow up for PMR after starting methotrexate 15 mg PO weekly and continuing prednisone with taper from 20 mg down to 10 mg over the past month. He is down to 10 mg since last Thursday and did feel increased pain and stiffness Friday and over the weekend, but symptoms are not too bad today. He rates good days as about a 2 but alternates with days he is stiff all over but especially in the back around base of the neck and in both shoulders. He did experienced some wrist stiffness down the right arm which is new. He has increased bruising over both forearms which is new.    08/08/20 Victor Ramirez is a 65 y.o. male referred for PMR. His symptoms started abruptly with severe pain in bilateral shoulders limiting use and mobility especially first thing in the morning.  He does not require any preceding injury or changes in health or medications. For evaluation and primary care clinic with x-ray imaging obtained that did not show any structural cause of symptoms he had markedly high inflammatory markers and was started on oral prednisone for suspected PMR.  He noticed dramatic improvement in symptoms within 2 days of  starting the medicine.  At follow-up he was recommended to decrease the dose down to 15 mg of prednisone at which time he noticed a return of his symptoms.  He increased back to 20 mg and feels this is doing well again.  The most affected areas were in the base of his neck and bilateral upper back and shoulders with radiation down the upper portion of the arms.  He also felt like it was affecting his right hip and thigh much worse than left, where he has chronic deficits due to history of polio.  He has started to notice some weight gain with the oral prednisone otherwise no specific side effect problems.   No Rheumatology ROS completed.   PMFS History:  Patient Active Problem List   Diagnosis Date Noted   Pain in left shoulder 11/28/2020   Rheumatoid factor positive 08/29/2020   Polymyalgia rheumatica (HCC) 08/08/2020   Polyarthralgia 08/08/2020   High risk medication use 08/08/2020   Lumbosacral spondylosis with radiculopathy 11/27/2016   Atrophy of muscle of right lower leg 02/04/2014   History of post-polio syndrome 02/02/2014   Pain of left lower extremity 02/02/2014   Chronic back pain 05/14/2013   HTN (hypertension) 05/14/2013   Headache 12/22/2011   Sleep apnea 11/17/2011   Elevation of level of transaminase  or lactic acid dehydrogenase (LDH) 09/18/2010   Anxiety 09/08/2010   Tachycardia 09/08/2010   Hyperlipemia 03/01/2006   TIA (transient ischemic attack) 03/01/2006   Tobacco use disorder 03/01/2006   Depressive disorder 10/16/2005    Past Medical History:  Diagnosis Date   Hemochromatosis    Hypertension     Family History  Problem Relation Age of Onset   Arthritis Mother    Thyroid cancer Mother    Melanoma Father    Fibromyalgia Sister    Cancer Brother    Prostate cancer Brother    Williams syndrome Son    Past Surgical History:  Procedure Laterality Date   ANTERIOR LATERAL LUMBAR FUSION WITH PERCUTANEOUS SCREW 1 LEVEL Right 11/27/2016   Procedure: Lumbar  Three-Four Transpsoas Lumbar InterbodyFfusion;  Surgeon: Ditty, Loura Halt, MD;  Location: [facility] OR;  Service: Neurosurgery;  Laterality: Right;  L3-4 Transpsoas lumbar interbody fusion/L3-4 Pedicle screw fixation with posterolateral arthrodesis/minimally invasive decompression/Mazor   APPLICATION OF ROBOTIC ASSISTANCE FOR SPINAL PROCEDURE N/A 11/27/2016   Procedure: Lumbar Three-Four Pedicle Screw Fixation with Posterolateral Arthrodesis with Minimally Invasive Decompression with Application of Robotic Assistance;  Surgeon: Ditty, Loura Halt, MD;  Location: [facility] OR;  Service: Neurosurgery;  Laterality: N/A;   BACK SURGERY     x3    BACK SURGERY     battery surgery    spinal manipulation under anesthesia     Social History   Social History Narrative   Not on file   Immunization History  Administered Date(s) Administered   PFIZER(Purple Top)SARS-COV-2 Vaccination 06/01/2019, 07/01/2019     Objective: Vital Signs: BP (!) 148/92 (BP Location: Left Arm, Patient Position: Sitting, Cuff Size: Large)   Pulse 97   Resp 12   Ht 5\' 8"  (1.727 m)   Wt 186 lb 9.6 oz (84.6 kg)   BMI 28.37 kg/m    Physical Exam Cardiovascular:     Rate and Rhythm: Normal rate and regular rhythm.  Pulmonary:     Effort: Pulmonary effort is normal.     Breath sounds: Normal breath sounds.  Skin:    General: Skin is warm and dry.     Findings: No rash.  Neurological:     Mental Status: He is alert.  Psychiatric:        Mood and Affect: Mood normal.     Musculoskeletal Exam:  Left shoulder painful arc test, positive neer and hawkins test, strength is normal and full passive ROM, right shoulder normal Bilateral hands no tenderness or swelling Right leg proximal strength 4/5 left 5/5 No peripheral edema   Investigation: No additional findings.  Imaging: No results found.  Recent Labs: Lab Results  Component Value Date   WBC 12.2 (H) 09/30/2020   HGB 13.7 09/30/2020   PLT 281 09/30/2020   NA  138 09/30/2020   K 5.4 (H) 09/30/2020   CL 103 09/30/2020   CO2 28 09/30/2020   GLUCOSE 120 (H) 09/30/2020   BUN 11 09/30/2020   CREATININE 0.78 09/30/2020   BILITOT 0.4 11/07/2020   ALKPHOS 53 11/19/2010   AST 38 (H) 11/07/2020   ALT 48 (H) 11/07/2020   PROT 6.3 11/07/2020   ALBUMIN 4.5 11/19/2010   CALCIUM 9.2 09/30/2020   GFRAA 111 08/08/2020    Speciality Comments: No specialty comments available.  Procedures:  Large Joint Inj: L subacromial bursa on 11/28/2020 11:50 AM Indications: pain Details: 25 G 1.5 in needle, lateral approach Medications: 3 mL lidocaine 1 %; 40 mg methylPREDNISolone acetate 40  MG/ML Outcome: tolerated well, no immediate complications Procedure, treatment alternatives, risks and benefits explained, specific risks discussed. Consent was given by the patient. Immediately prior to procedure a time out was called to verify the correct patient, procedure, equipment, support staff and site/side marked as required. Patient was prepped and draped in the usual sterile fashion.    Allergies: Patient has no known allergies.   Assessment / Plan:     Visit Diagnoses: Polymyalgia rheumatica (HCC) - Plan: Sedimentation rate, predniSONE (DELTASONE) 5 MG tablet Polyarthralgia  Joint pain in multiple sites significant involvement of neck bilateral shoulders and bilateral hips that he does also have some peripheral joint pain not as typical for PMR.  Left shoulder is significantly worse and not responding like the rest we will plan to treat this locally.  Checking sedimentation rate for monitoring disease activity had been downtrending previously on steroid treatment.  Plan to taper prednisone down to 7.5 mg now and down to 5 mg after 1 month.  Chronic left shoulder pain  Symptoms appear most consistent with rotator cuff pathology probably supraspinatus.  Subacromial bursa steroid injection today for symptom improvement hopefully can help minimize prolonged systemic  steroids.  Would also be a candidate for physical therapy and if failing to improve could obtain updated imaging.  Orders: Orders Placed This Encounter  Procedures   Large Joint Inj   Sedimentation rate    Meds ordered this encounter  Medications   predniSONE (DELTASONE) 5 MG tablet    Sig: Take 1.5 tablets (7.5 mg total) by mouth daily with breakfast for 30 days, THEN 1 tablet (5 mg total) daily with breakfast.    Dispense:  75 tablet    Refill:  0      Follow-Up Instructions: Return in about 2 months (around 01/28/2021) for PMR/shoulder/neck prednisone taper.   Fuller Plan, MD  Note - This record has been created using [employer].  Chart creation errors have been sought, but may not always  have been located. Such creation errors do not reflect on  the standard of medical care.

## 2020-11-28 ENCOUNTER — Ambulatory Visit: Payer: Medicare Other | Admitting: Internal Medicine

## 2020-11-28 ENCOUNTER — Other Ambulatory Visit: Payer: Self-pay

## 2020-11-28 ENCOUNTER — Encounter: Payer: Self-pay | Admitting: Internal Medicine

## 2020-11-28 VITALS — BP 148/92 | HR 97 | Resp 12 | Ht 68.0 in | Wt 186.6 lb

## 2020-11-28 DIAGNOSIS — M2550 Pain in unspecified joint: Secondary | ICD-10-CM | POA: Diagnosis not present

## 2020-11-28 DIAGNOSIS — M25512 Pain in left shoulder: Secondary | ICD-10-CM | POA: Insufficient documentation

## 2020-11-28 DIAGNOSIS — G8929 Other chronic pain: Secondary | ICD-10-CM

## 2020-11-28 DIAGNOSIS — M353 Polymyalgia rheumatica: Secondary | ICD-10-CM | POA: Diagnosis not present

## 2020-11-28 LAB — SEDIMENTATION RATE: Sed Rate: 22 mm/h — ABNORMAL HIGH (ref 0–20)

## 2020-11-28 MED ORDER — LIDOCAINE HCL 1 % IJ SOLN
3.0000 mL | INTRAMUSCULAR | Status: AC | PRN
  Administered 2020-11-28: 3 mL

## 2020-11-28 MED ORDER — METHYLPREDNISOLONE ACETATE 40 MG/ML IJ SUSP
40.0000 mg | INTRAMUSCULAR | Status: AC | PRN
Start: 2020-11-28 — End: 2020-11-28
  Administered 2020-11-28: 40 mg via INTRA_ARTICULAR

## 2020-11-28 MED ORDER — PREDNISONE 5 MG PO TABS: ORAL_TABLET | ORAL | 0 refills | Status: AC

## 2020-11-28 NOTE — Patient Instructions (Signed)
I recommend tapering down the prednisone dose stepwise. You can decrease to 7.5 mg (1.5 tabs) for 1 month then down to 5 mg (1 tab) daily for a month and we can follow up.  Joint Steroid Injection A joint steroid injection is a procedure to relieve swelling and pain in a joint. Steroids are medicines that reduce inflammation. In this procedure, your health care provider uses a syringe and a needle to inject a steroid medicine into a painful and inflamed joint. A pain-relieving medicine (anesthetic) may be injected along with the steroid. In some cases, your health care provider may use an imaging technique such as ultrasound or fluoroscopy to guide the injection. Joints that are often treated with steroid injections include the knee, shoulder, hip, and spine. These injections may also be used in the elbow, ankle, and joints of the hands or feet. You may have joint steroid injections as part of your treatment for inflammation caused by: Gout. Rheumatoid arthritis. Advanced wear-and-tear arthritis (osteoarthritis). Tendinitis. Bursitis. Joint steroid injections may be repeated, but having them too often can damage a joint or the skin over the joint. You should not have joint steroid injections less than 6 weeks apart or more than four times a year. Tell a health care provider about: Any allergies you have. All medicines you are taking, including vitamins, herbs, eye drops, creams, and over-the-counter medicines. Any problems you or family members have had with anesthetic medicines. Any blood disorders you have. Any surgeries you have had. Any medical conditions you have. Whether you are pregnant or may be pregnant. What are the risks? Generally, this is a safe treatment. However, problems may occur, including: Infection. Bleeding. Allergic reactions to medicines. Damage to the joint or tissues around the joint. Thinning of skin or loss of skin color over the joint. Temporary flushing of the  face or chest. Temporary increase in pain. Temporary increase in blood sugar. Failure to relieve inflammation or pain. What happens before the treatment? Medicines Ask your health care provider about: Changing or stopping your regular medicines. This is especially important if you are taking diabetes medicines or blood thinners. Taking medicines such as aspirin and ibuprofen. These medicines can thin your blood. Do not take these medicines unless your health care provider tells you to take them. Taking over-the-counter medicines, vitamins, herbs, and supplements. General instructions You may have imaging tests of your joint. Ask your health care provider if you can drive yourself home after the procedure. What happens during the treatment?  Your health care provider will position you for the injection and locate the injection site over your joint. The skin over the joint will be cleaned with a germ-killing soap. Your health care provider may: Spray a numbing solution (topical anesthetic) over the injection site. Inject a local anesthetic under the skin above your joint. The needle will be placed through your skin into your joint. Your health care provider may use imaging to guide the needle to the right spot for the injection. If imaging is used, a special contrast dye may be injected to confirm that the needle is in the correct location. The steroid medicine will be injected into your joint. Anesthetic may be injected along with the steroid. This may be a medicine that relieves pain for a short time (short-acting anesthetic) or for a longer time (long-acting anesthetic). The needle will be removed, and an adhesive bandage (dressing) will be placed over the injection site. The procedure may vary among health care providers and hospitals.  What can I expect after the treatment? You will be able to go home after the treatment. It is normal to feel slight flushing for a few days after the  injection. After the treatment, it is common to have an increase in joint pain after the anesthetic has worn off. This may happen about an hour after a short-acting anesthetic or about 8 hours after a longer-acting anesthetic. You should begin to feel relief from joint pain and swelling after 24 to 48 hours. Contact your health care provider if you do not begin to feel relief after 2 days. Follow these instructions at home: Injection site care Leave the adhesive dressing over your injection site in place until your health care provider says you can remove it. Check your injection site every day for signs of infection. Check for: More redness, swelling, or pain. Fluid or blood. Warmth. Pus or a bad smell. Activity Return to your normal activities as told by your health care provider. Ask your health care provider what activities are safe for you. You may be asked to limit activities that put stress on the joint for a few days. Do joint exercises as told by your health care provider. Do not take baths, swim, or use a hot tub until your health care provider approves. Ask your health care provider if you may take showers. You may only be allowed to take sponge baths. Managing pain, stiffness, and swelling  If directed, put ice on the joint. To do this: Put ice in a plastic bag. Place a towel between your skin and the bag. Leave the ice on for 20 minutes, 2-3 times a day. Remove the ice if your skin turns bright red. This is very important. If you cannot feel pain, heat, or cold, you have a greater risk of damage to the area. Raise (elevate) your joint above the level of your heart when you are sitting or lying down. General instructions Take over-the-counter and prescription medicines only as told by your health care provider. Do not use any products that contain nicotine or tobacco, such as cigarettes, e-cigarettes, and chewing tobacco. These can delay joint healing. If you need help quitting,  ask your health care provider. If you have diabetes, be aware that your blood sugar may be slightly elevated for several days after the injection. Keep all follow-up visits. This is important. Contact a health care provider if you have: Chills or a fever. Any signs of infection at your injection site. Increased pain or swelling or no relief after 2 days. Summary A joint steroid injection is a treatment to relieve pain and swelling in a joint. Steroids are medicines that reduce inflammation. Your health care provider may add an anesthetic along with the steroid. You may have joint steroid injections as part of your arthritis treatment. Joint steroid injections may be repeated, but having them too often can damage a joint or the skin over the joint. Contact your health care provider if you have a fever, chills, or signs of infection, or if you get no relief from joint pain or swelling. This information is not intended to replace advice given to you by your health care provider. Make sure you discuss any questions you have with your health care provider. Document Revised: 07/28/2019 Document Reviewed: 07/28/2019 Elsevier Patient Education  2022 [employer].

## 2020-11-29 NOTE — Progress Notes (Signed)
Sedimentation rate remains the same at 22 which is low. I recommend proceeding with reducing the prednisone as discussed in clinic.

## 2020-12-02 ENCOUNTER — Emergency Department (HOSPITAL_COMMUNITY): Payer: Medicare Other

## 2020-12-02 ENCOUNTER — Observation Stay (HOSPITAL_COMMUNITY)
Admission: EM | Admit: 2020-12-02 | Discharge: 2020-12-04 | Disposition: A | Discharge status: Home / Self Care | Payer: Medicare Other | Attending: Emergency Medicine | Admitting: Emergency Medicine

## 2020-12-02 ENCOUNTER — Encounter (HOSPITAL_COMMUNITY): Payer: Self-pay | Admitting: Emergency Medicine

## 2020-12-02 ENCOUNTER — Other Ambulatory Visit: Payer: Self-pay

## 2020-12-02 DIAGNOSIS — Z8673 Personal history of transient ischemic attack (TIA), and cerebral infarction without residual deficits: Secondary | ICD-10-CM | POA: Diagnosis not present

## 2020-12-02 DIAGNOSIS — K859 Acute pancreatitis without necrosis or infection, unspecified: Secondary | ICD-10-CM | POA: Diagnosis not present

## 2020-12-02 DIAGNOSIS — M549 Dorsalgia, unspecified: Secondary | ICD-10-CM | POA: Diagnosis present

## 2020-12-02 DIAGNOSIS — I10 Essential (primary) hypertension: Secondary | ICD-10-CM | POA: Diagnosis not present

## 2020-12-02 DIAGNOSIS — D72829 Elevated white blood cell count, unspecified: Secondary | ICD-10-CM | POA: Diagnosis not present

## 2020-12-02 DIAGNOSIS — I7143 Infrarenal abdominal aortic aneurysm, without rupture: Secondary | ICD-10-CM | POA: Diagnosis not present

## 2020-12-02 DIAGNOSIS — R1011 Right upper quadrant pain: Secondary | ICD-10-CM | POA: Diagnosis not present

## 2020-12-02 DIAGNOSIS — Z79899 Other long term (current) drug therapy: Secondary | ICD-10-CM | POA: Diagnosis not present

## 2020-12-02 DIAGNOSIS — M353 Polymyalgia rheumatica: Secondary | ICD-10-CM | POA: Diagnosis present

## 2020-12-02 DIAGNOSIS — B349 Viral infection, unspecified: Secondary | ICD-10-CM | POA: Diagnosis not present

## 2020-12-02 DIAGNOSIS — E785 Hyperlipidemia, unspecified: Secondary | ICD-10-CM | POA: Diagnosis present

## 2020-12-02 DIAGNOSIS — Z87891 Personal history of nicotine dependence: Secondary | ICD-10-CM | POA: Diagnosis not present

## 2020-12-02 DIAGNOSIS — Z20822 Contact with and (suspected) exposure to covid-19: Secondary | ICD-10-CM | POA: Insufficient documentation

## 2020-12-02 DIAGNOSIS — G8929 Other chronic pain: Secondary | ICD-10-CM | POA: Diagnosis present

## 2020-12-02 LAB — COMPREHENSIVE METABOLIC PANEL
ALT: 49 U/L — ABNORMAL HIGH (ref 0–44)
AST: 31 U/L (ref 15–41)
Albumin: 3.7 g/dL (ref 3.5–5.0)
Alkaline Phosphatase: 68 U/L (ref 38–126)
Anion gap: 9 (ref 5–15)
BUN: 12 mg/dL (ref 8–23)
CO2: 23 mmol/L (ref 22–32)
Calcium: 9.3 mg/dL (ref 8.9–10.3)
Chloride: 102 mmol/L (ref 98–111)
Creatinine, Ser: 0.78 mg/dL (ref 0.61–1.24)
GFR, Estimated: >60 mL/min (ref >60)
Glucose, Bld: 94 mg/dL (ref 70–99)
Potassium: 4.8 mmol/L (ref 3.5–5.1)
Sodium: 134 mmol/L — ABNORMAL LOW (ref 135–145)
Total Bilirubin: 0.7 mg/dL (ref 0.3–1.2)
Total Protein: 7.3 g/dL (ref 6.5–8.1)

## 2020-12-02 LAB — CBC WITH DIFFERENTIAL/PLATELET
Abs Immature Granulocytes: 0.07 10*3/uL (ref 0.00–0.07)
Basophils Absolute: 0 10*3/uL (ref 0.0–0.1)
Basophils Relative: 0 %
Eosinophils Absolute: 0 10*3/uL (ref 0.0–0.5)
Eosinophils Relative: 0 %
HCT: 42.4 % (ref 39.0–52.0)
Hemoglobin: 14.6 g/dL (ref 13.0–17.0)
Immature Granulocytes: 1 %
Lymphocytes Relative: 9 %
Lymphs Abs: 1.4 10*3/uL (ref 0.7–4.0)
MCH: 33.7 pg (ref 26.0–34.0)
MCHC: 34.4 g/dL (ref 30.0–36.0)
MCV: 97.9 fL (ref 80.0–100.0)
Monocytes Absolute: 0.9 10*3/uL (ref 0.1–1.0)
Monocytes Relative: 6 %
Neutro Abs: 12.6 10*3/uL — ABNORMAL HIGH (ref 1.7–7.7)
Neutrophils Relative %: 84 %
Platelets: 263 10*3/uL (ref 150–400)
RBC: 4.33 MIL/uL (ref 4.22–5.81)
RDW: 12.4 % (ref 11.5–15.5)
WBC: 15 10*3/uL — ABNORMAL HIGH (ref 4.0–10.5)
nRBC: 0 % (ref 0.0–0.2)

## 2020-12-02 LAB — URINALYSIS, ROUTINE W REFLEX MICROSCOPIC
Bilirubin Urine: NEGATIVE
Glucose, UA: NEGATIVE mg/dL
Hgb urine dipstick: NEGATIVE
Ketones, ur: 20 mg/dL — AB
Leukocytes,Ua: NEGATIVE
Nitrite: NEGATIVE
Protein, ur: NEGATIVE mg/dL
Specific Gravity, Urine: 1.016 (ref 1.005–1.030)
pH: 6 (ref 5.0–8.0)

## 2020-12-02 LAB — LIPASE, BLOOD: Lipase: 138 U/L — ABNORMAL HIGH (ref 11–51)

## 2020-12-02 MED ORDER — MORPHINE SULFATE (PF) 2 MG/ML IV SOLN
2.0000 mg | INTRAVENOUS | Status: DC | PRN
  Administered 2020-12-03 (×2): 2 mg via INTRAVENOUS
  Filled 2020-12-02 (×2): qty 1

## 2020-12-02 MED ORDER — ONDANSETRON HCL 4 MG PO TABS: 4.0000 mg | ORAL_TABLET | Freq: Four times a day (QID) | ORAL | Status: DC | PRN

## 2020-12-02 MED ORDER — SODIUM CHLORIDE 0.9 % IV BOLUS
1000.0000 mL | Freq: Once | INTRAVENOUS | Status: AC
  Administered 2020-12-02: 1000 mL via INTRAVENOUS

## 2020-12-02 MED ORDER — ENOXAPARIN SODIUM 40 MG/0.4ML IJ SOSY
40.0000 mg | PREFILLED_SYRINGE | INTRAMUSCULAR | Status: DC
  Administered 2020-12-02 – 2020-12-03 (×2): 40 mg via SUBCUTANEOUS
  Filled 2020-12-02: qty 0.4

## 2020-12-02 MED ORDER — SODIUM CHLORIDE 0.9 % IV SOLN: INTRAVENOUS | Status: AC

## 2020-12-02 MED ORDER — IOHEXOL 300 MG/ML  SOLN
100.0000 mL | Freq: Once | INTRAMUSCULAR | Status: AC | PRN
  Administered 2020-12-02: 100 mL via INTRAVENOUS

## 2020-12-02 MED ORDER — ONDANSETRON HCL 4 MG/2ML IJ SOLN
4.0000 mg | Freq: Once | INTRAMUSCULAR | Status: DC
  Filled 2020-12-02: qty 2

## 2020-12-02 MED ORDER — ONDANSETRON HCL 4 MG/2ML IJ SOLN
4.0000 mg | Freq: Four times a day (QID) | INTRAMUSCULAR | Status: DC | PRN
  Administered 2020-12-02: 4 mg via INTRAVENOUS

## 2020-12-02 MED ORDER — SODIUM CHLORIDE 0.9 % IV SOLN: Freq: Once | INTRAVENOUS | Status: DC

## 2020-12-02 MED ORDER — HYDROMORPHONE HCL 1 MG/ML IJ SOLN
1.0000 mg | Freq: Once | INTRAMUSCULAR | Status: AC
  Administered 2020-12-02: 1 mg via INTRAVENOUS
  Filled 2020-12-02: qty 1

## 2020-12-02 NOTE — ED Triage Notes (Signed)
Patient complains of abdominal cramping and intestinal gas that started a few weeks ago and is severely exacerbated by eating, is mildly improved by ambulation.

## 2020-12-02 NOTE — H&P (Addendum)
History and Physical    TIBURCIO LINDER BUL:[id number] DOB: [birth year] DOA: 12/02/2020  PCP: Wilfrid Lund, PA  Patient coming from: Home  I have personally briefly reviewed patient's old medical records in [facility]  Chief Complaint: Abdominal pain  HPI: VIOLA PLACERES is a 65 y.o. male with medical history significant for polymyalgia rheumatica (currently on prednisone 7.5 mg daily with ongoing slow taper), HTN, HLD, chronic back pain who presented to the ED for evaluation of abdominal pain.  Patient reports 1 week ago developing intermittent cramping right upper quadrant abdominal pain radiating to his flank and back.  Pain was significant left that he thought about coming to the hospital but ultimately did not as it began to improve.  It had been ongoing and intermittent until 3 days ago when pain became more severe and constant.  He has not been able to eat the last 3 days due to his pain.  He denies any significant nausea or vomiting.  He has not had any subjective fevers, chills, diaphoresis, chest pain, dyspnea, dysuria, diarrhea.  Patient was previously on methotrexate for management of his PMR however this was discontinued due to rising LFTs.  He has been on prednisone for nearly 4 months, initially 20 mg daily now down to 7.5 mg daily with ongoing slow taper.  Patient states that his wife passed away about 2 years ago.  Since then he does admit to increased alcohol intake, approximately 4-5 nights per week with frequent binge drinking.  Last drink was when his current symptoms began.  He denies any history of withdrawal.  He does report a history of pancreatitis when he was 65 years old which at the time was thought secondary to alcohol use.  He is a former smoker, quit about 15 years ago.  He denies any recreational drug use.  ED Course:  Initial BP 147/87, pulse 104, RR 16, temp 98.7 F, SPO2 94% on room air.  Labs show WBC 15.0, hemoglobin 14.6, platelets 263,000,  lipase 138, sodium 134, potassium 4.8, bicarb 23, BUN 12, creatinine 0.78, serum glucose 94, AST 31, ALT 49, alkaline phosphatase 68, total bilirubin 0.7.  Urinalysis negative for UTI.  SARS-CoV-2 PCR panel ordered and pending.  RUQ ultrasound does not show any evidence of gallstones or gallbladder wall thickening.  CBD diameter 4 mm.  Liver exam unremarkable.  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  Incidental 3.8 cm infrarenal abdominal aortic aneurysm noted.  Patient was given 1 mg IV Dilaudid, IV Zofran, 1 L normal saline and the hospitalist service was consulted to admit for further evaluation and management.  Review of Systems: All systems reviewed and are negative except as documented in history of present illness above.   Past Medical History:  Diagnosis Date   Hemochromatosis    Hypertension     Past Surgical History:  Procedure Laterality Date   ANTERIOR LATERAL LUMBAR FUSION WITH PERCUTANEOUS SCREW 1 LEVEL Right 11/27/2016   Procedure: Lumbar Three-Four Transpsoas Lumbar InterbodyFfusion;  Surgeon: Ditty, Loura Halt, MD;  Location: [facility] OR;  Service: Neurosurgery;  Laterality: Right;  L3-4 Transpsoas lumbar interbody fusion/L3-4 Pedicle screw fixation with posterolateral arthrodesis/minimally invasive decompression/Mazor   APPLICATION OF ROBOTIC ASSISTANCE FOR SPINAL PROCEDURE N/A 11/27/2016   Procedure: Lumbar Three-Four Pedicle Screw Fixation with Posterolateral Arthrodesis with Minimally Invasive Decompression with Application of Robotic  Assistance;  Surgeon: Ditty, Loura Halt, MD;  Location: [facility] OR;  Service: Neurosurgery;  Laterality: N/A;   BACK SURGERY     x3    BACK SURGERY     battery surgery    spinal manipulation under anesthesia      Social History:  reports that  he has quit smoking. His smoking use included cigarettes. He has never used smokeless tobacco. He reports current alcohol use of about 10.0 standard drinks per week. He reports that he does not use drugs.  No Known Allergies  Family History  Problem Relation Age of Onset   Arthritis Mother    Thyroid cancer Mother    Melanoma Father    Fibromyalgia Sister    Cancer Brother    Prostate cancer Brother    Williams syndrome Son      Prior to Admission medications   Medication Sig Start Date End Date Taking? Authorizing Provider  buPROPion (WELLBUTRIN XL) 150 MG 24 hr tablet Take 1 tablet by mouth every morning. 07/15/20   [provider]  cetirizine (ZYRTEC) 10 MG tablet Take 10 mg by mouth daily.    [provider]  fluticasone (FLONASE) 50 MCG/ACT nasal spray Place 2 sprays into both nostrils daily.    [provider]  gabapentin (NEURONTIN) 300 MG capsule Take 1 capsule (300 mg total) by mouth 3 (three) times daily. Patient taking differently: Take 600 mg by mouth 2 (two) times daily. 11/28/16   Costella, Darci Current, PA-C  HYDROcodone-acetaminophen (NORCO/VICODIN) 5-325 MG tablet Take 1-2 tablets by mouth every 4 (four) hours as needed for moderate pain. 11/28/16   Costella, Darci Current, PA-C  lisinopril (PRINIVIL,ZESTRIL) 40 MG tablet Take 40 mg by mouth daily. 08/15/16   [provider]  methocarbamol (ROBAXIN) 500 MG tablet 2 (two) times daily. 03/16/18   [provider]  predniSONE (DELTASONE) 5 MG tablet Take 1.5 tablets (7.5 mg total) by mouth daily with breakfast for 30 days, THEN 1 tablet (5 mg total) daily with breakfast. 11/28/20 01/27/21  Fuller Plan, MD  rosuvastatin (CRESTOR) 5 MG tablet Take 5 mg by mouth daily.    [provider]    Physical Exam: Vitals:   12/02/20 1358 12/02/20 1646 12/02/20 1958 12/02/20 2145  BP: (!) 147/87 (!) 160/90 (!) 159/107 (!) 150/94  Pulse: (!) 104 (!) 101 (!) 105 (!) 103  Resp: 16 16 16  12   Temp: 98.7 F (37.1 C)     TempSrc: Oral     SpO2: 94% 100% 100% 100%   Constitutional: Resting supine in bed, NAD, calm, comfortable Eyes: PERRL, lids and conjunctivae normal ENMT: Mucous membranes are moist. Posterior pharynx clear of any exudate or lesions.Normal dentition.  Neck: normal, supple, no masses. Respiratory: clear to auscultation bilaterally, no wheezing, no crackles. Normal respiratory effort. No accessory muscle use.  Cardiovascular: Regular rate and rhythm, no murmurs / rubs / gallops. No extremity edema. 2+ pedal pulses. Abdomen: Right upper quadrant tender to light palpation otherwise no tenderness rest of abdomen., no masses palpated. No hepatosplenomegaly. Bowel sounds positive.  Musculoskeletal: no clubbing / cyanosis. No joint deformity upper and lower extremities. Good ROM, no contractures. Normal muscle tone.  Skin: no rashes, lesions, ulcers. No induration Neurologic: CN 2-12 grossly intact. Sensation intact. Strength 5/5 in all 4.  Psychiatric: Normal judgment and insight. Alert and oriented x 3. Normal mood.   Labs on Admission: I have personally reviewed following labs and imaging studies  CBC: Recent Labs  Lab 12/02/20 1406  WBC 15.0*  NEUTROABS 12.6*  HGB 14.6  HCT 42.4  MCV 97.9  PLT 263   Basic Metabolic Panel: Recent Labs  Lab 12/02/20 1406  NA 134*  K 4.8  CL 102  CO2 23  GLUCOSE 94  BUN 12  CREATININE 0.78  CALCIUM 9.3   GFR: Estimated Creatinine Clearance: 101.4 mL/min (by C-G formula based on SCr of 0.78 mg/dL). Liver Function Tests: Recent Labs  Lab 12/02/20 1406  AST 31  ALT 49*  ALKPHOS 68  BILITOT 0.7  PROT 7.3  ALBUMIN 3.7   Recent Labs  Lab 12/02/20 1406  LIPASE 138*   No results for input(s): AMMONIA in the last 168 hours. Coagulation Profile: No results for input(s): INR, PROTIME in the last 168 hours. Cardiac Enzymes: No results for input(s): CKTOTAL, CKMB, CKMBINDEX, TROPONINI in the last 168  hours. BNP (last 3 results) No results for input(s): PROBNP in the last 8760 hours. HbA1C: No results for input(s): HGBA1C in the last 72 hours. CBG: No results for input(s): GLUCAP in the last 168 hours. Lipid Profile: No results for input(s): CHOL, HDL, LDLCALC, TRIG, CHOLHDL, LDLDIRECT in the last 72 hours. Thyroid Function Tests: No results for input(s): TSH, T4TOTAL, FREET4, T3FREE, THYROIDAB in the last 72 hours. Anemia Panel: No results for input(s): VITAMINB12, FOLATE, FERRITIN, TIBC, IRON, RETICCTPCT in the last 72 hours. Urine analysis:    Component Value Date/Time   COLORURINE YELLOW 12/02/2020 1402   APPEARANCEUR CLEAR 12/02/2020 1402   LABSPEC 1.016 12/02/2020 1402   PHURINE 6.0 12/02/2020 1402   GLUCOSEU NEGATIVE 12/02/2020 1402   HGBUR NEGATIVE 12/02/2020 1402   BILIRUBINUR NEGATIVE 12/02/2020 1402   KETONESUR 20 (A) 12/02/2020 1402   PROTEINUR NEGATIVE 12/02/2020 1402   NITRITE NEGATIVE 12/02/2020 1402   LEUKOCYTESUR NEGATIVE 12/02/2020 1402    Radiological Exams on Admission: CT ABDOMEN PELVIS W CONTRAST  Result Date: 12/02/2020 CLINICAL DATA:  Abdominal cramping and intestinal gas started a few weeks ago EXAM: CT ABDOMEN AND PELVIS WITH CONTRAST TECHNIQUE: Multidetector CT imaging of the abdomen and pelvis was performed using the standard protocol following bolus administration of intravenous contrast. CONTRAST:  OMNIPAQUE IOHEXOL 300 MG/ML  SOLN COMPARISON:  CT lumbar spine 02/17/2017 FINDINGS: Lower chest: The lung bases are clear. There are mild mitral annular calcifications. The imaged heart is otherwise unremarkable. Hepatobiliary: There is a small calcification in the left hepatic lobe along the falciform ligament which may reflect a calcified granuloma. The liver is otherwise unremarkable. The gallbladder is unremarkable. There is no biliary ductal dilatation. Pancreas: There is minimal fat stranding in the fat surrounding the pancreatic tail (6-56).  There is no organized peripancreatic fluid collection. The pancreatic parenchyma enhances normally. There is no focal lesion or contour abnormality. There is no main pancreatic ductal dilatation. Spleen: Unremarkable.  There is a small accessory spleen. Adrenals/Urinary Tract: The adrenals are unremarkable. The kidneys are unremarkable, with no focal lesion, stone, hydronephrosis, or hydroureter. The bladder is unremarkable. Stomach/Bowel: The stomach is unremarkable. There is no evidence of bowel obstruction. There is no abnormal bowel wall thickening or inflammatory change. There are scattered colonic diverticula without evidence of acute diverticulitis. Vascular/Lymphatic: There is dense calcified atherosclerotic plaque throughout the abdominal aorta. There is aneurysmal dilation of the infrarenal abdominal aorta measuring up to 3.8 cm. The major arterial branch vessels are patent. The main portal and splenic veins are patent. There is no abdominal or pelvic lymphadenopathy. Reproductive: The prostate and seminal vesicles  are unremarkable. Other: There is no ascites or free air. There is no evidence of abscess in the abdomen or pelvis. Musculoskeletal: The patient is status post L3-L4 TLIF and bilateral L5-S1 laminectomies. Hardware alignment is within expected limits, without evidence of hardware related complication. 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 There is grade 1 anterolisthesis of L5 on S1, not significantly changed. There is no acute osseous abnormality or aggressive osseous lesion. Coiled presumed abandoned leads are seen in the soft tissues posterior to the sacrum, unchanged since 2018. IMPRESSION: 1. Minimal peripancreatic fat stranding may reflect very mild acute pancreatitis given elevated lipase. 2. Diverticulosis without evidence of acute diverticulitis. 3. 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 4. 3.8 cm infrarenal abdominal aortic aneurysm. Recommend follow-up every 2 years. Reference: J Am Coll Radiol 2013;10:789-794. Electronically Signed   By: Lesia Hausen M.D.   On: 12/02/2020 17:12   US Abdomen Limited RUQ (LIVER/GB)  Result Date: 12/02/2020 CLINICAL DATA:  Right upper quadrant abdominal pain for 3 weeks EXAM: ULTRASOUND ABDOMEN LIMITED RIGHT UPPER QUADRANT COMPARISON:  None. FINDINGS: Gallbladder: No gallstones or wall thickening visualized. No sonographic Murphy sign noted by sonographer. Common bile duct: Diameter: 4 mm. Liver: No focal lesion identified. Within normal limits in parenchymal echogenicity. Portal vein is patent on color Doppler imaging with normal direction of blood flow towards the liver. Other: None. IMPRESSION: Unremarkable ultrasound of the right upper quadrant. Electronically Signed   By: Duanne Guess D.O.   On: 12/02/2020 14:56    EKG: Not performed.  Assessment/Plan Principal Problem:   Acute pancreatitis without infection or necrosis Active Problems:   Chronic back pain   HTN (hypertension)   Hyperlipemia   Polymyalgia rheumatica (HCC)   RAYON MCCHRISTIAN is a 65 y.o. male with medical history significant for polymyalgia rheumatica (currently on prednisone 7.5 mg daily with ongoing slow taper), HTN, HLD, chronic back pain who is admitted with acute pancreatitis.  Acute pancreatitis: Appears to be mild pancreatitis based on CT imaging and lipase level although this could be a downtrending lipase given late presentation.  Etiology unclear.  RUQ ultrasound is normal.  Could be alcohol related.  On a few medications (prednisone, lisinopril, rosuvastatin) which show very limited data of association with pancreatitis, feel these are less likely. -Keep n.p.o. -IV morphine as needed for pain -Receiving 1 L NS bolus followed by maintenance fluids overnight -Zofran as needed  Polymyalgia rheumatica: Follows with rheumatology, Dr. Dimple Casey.  On slow  prednisone taper, currently 7.5 mg daily which is on hold tonight.  Leukocytosis: Likely reactive plus associated with steroid use.  Hypertension: Holding lisinopril.  Hyperlipidemia: Holding rosuvastatin.  Chronic back pain: S/p L3-L4 TLIF.  On chronic Norco which is currently on hold due to n.p.o. status.  Continue IV morphine as needed.  Depression: Bupropion on hold while NPO.  Infrarenal abdominal aortic aneurysm: 3.4 cm infrarenal abdominal aortic aneurysm noted on CT imaging.  Follow-up every 2 years recommended.  Findings discussed with patient.  DVT prophylaxis: Lovenox Code Status: Full code, confirmed with patient on admission Family Communication: Discussed with patient, he has discussed with family Disposition Plan: From home and likely return home pending clinical progress Consults called: None Level of care: Med-Surg Admission status:  Status is: Observation  The patient remains OBS appropriate and will d/c before 2 midnights.  Dispo: The patient is from: Home  Anticipated d/c is to: Home              Patient currently is not medically stable to d/c.   Difficult to place patient No  Darreld Mclean MD [facility]  If 7PM-7AM, please contact night-coverage [email or web address]  12/02/2020, 10:34 PM

## 2020-12-02 NOTE — ED Provider Notes (Signed)
[facility] EMERGENCY DEPARTMENT Provider Note   CSN: [id number] Arrival date & time: 12/02/20  1259     History Chief Complaint  Patient presents with   Abdominal Pain    Victor Ramirez is a 65 y.o. male with history of polymyalgia rheumatica on methotrexate and prednisone, chronic back pain, presenting to emerge apartment with abdominal pain and nausea.  He reports symptoms been ongoing for about a week.  He has intermittent flareups where he will have gas and abdominal discomfort at times, the usually last a few days and go away.  It was much more intense this past week.  He is not able to keep down any food, his pain significantly worsens after eating.  He reports nausea.  He reports loose bowel movements.  He reports sharp pain near his epigastrium and right lower side that seems to radiate around towards his back, more intense since had before, currently high intensity.  It is worse with movement and eating.  He has tried his home narcotics with little relief.  There is a report history of pancreatitis over 20 years ago.  He does report drinking an increased amount of alcohol recently, on a near daily basis, reporting he is dealing with stress in his life with the passing of his wife.  Unfortunate the patient did have a prolonged wait in the waiting room, approximately 8 hours, due to prolonged waiting times, prior to my evaluation in the ED.  HPI     Past Medical History:  Diagnosis Date   Hemochromatosis    Hypertension     Patient Active Problem List   Diagnosis Date Noted   RUQ abdominal pain    Acute pancreatitis without infection or necrosis 12/02/2020   Pain in left shoulder 11/28/2020   Rheumatoid factor positive 08/29/2020   Polymyalgia rheumatica (HCC) 08/08/2020   Polyarthralgia 08/08/2020   High risk medication use 08/08/2020   Lumbosacral spondylosis with radiculopathy 11/27/2016   Atrophy of muscle of right lower leg 02/04/2014   History  of post-polio syndrome 02/02/2014   Pain of left lower extremity 02/02/2014   Chronic back pain 05/14/2013   HTN (hypertension) 05/14/2013   Headache 12/22/2011   Sleep apnea 11/17/2011   Elevation of level of transaminase or lactic acid dehydrogenase (LDH) 09/18/2010   Anxiety 09/08/2010   Tachycardia 09/08/2010   Hyperlipemia 03/01/2006   TIA (transient ischemic attack) 03/01/2006   Tobacco use disorder 03/01/2006   Depressive disorder 10/16/2005    Past Surgical History:  Procedure Laterality Date   ANTERIOR LATERAL LUMBAR FUSION WITH PERCUTANEOUS SCREW 1 LEVEL Right 11/27/2016   Procedure: Lumbar Three-Four Transpsoas Lumbar InterbodyFfusion;  Surgeon: Ditty, Loura Halt, MD;  Location: [facility] OR;  Service: Neurosurgery;  Laterality: Right;  L3-4 Transpsoas lumbar interbody fusion/L3-4 Pedicle screw fixation with posterolateral arthrodesis/minimally invasive decompression/Mazor   APPLICATION OF ROBOTIC ASSISTANCE FOR SPINAL PROCEDURE N/A 11/27/2016   Procedure: Lumbar Three-Four Pedicle Screw Fixation with Posterolateral Arthrodesis with Minimally Invasive Decompression with Application of Robotic Assistance;  Surgeon: Ditty, Loura Halt, MD;  Location: [facility] OR;  Service: Neurosurgery;  Laterality: N/A;   BACK SURGERY     x3    BACK SURGERY     battery surgery    spinal manipulation under anesthesia         Family History  Problem Relation Age of Onset   Arthritis Mother    Thyroid cancer Mother    Melanoma Father    Fibromyalgia Sister    Cancer  Brother    Prostate cancer Brother    Williams syndrome Son     Social History   Tobacco Use   Smoking status: Former    Types: Cigarettes   Smokeless tobacco: Never   Tobacco comments:    quit over 10 years ago, 2000  Vaping Use   Vaping Use: Never used  Substance Use Topics   Alcohol use: Yes    Alcohol/week: 10.0 standard drinks    Types: 1 Shots of liquor, 9 Cans of beer per week   Drug use: No    Home  Medications Prior to Admission medications   Medication Sig Start Date End Date Taking? Authorizing Provider  buPROPion (WELLBUTRIN XL) 150 MG 24 hr tablet Take 150 mg by mouth every morning. 07/15/20  Yes [provider]  cetirizine (ZYRTEC) 10 MG tablet Take 10 mg by mouth daily.   Yes [provider]  fluticasone (FLONASE) 50 MCG/ACT nasal spray Place 2 sprays into both nostrils daily.   Yes [provider]  gabapentin (NEURONTIN) 300 MG capsule Take 1 capsule (300 mg total) by mouth 3 (three) times daily. Patient taking differently: Take 600 mg by mouth 2 (two) times daily. 11/28/16  Yes Costella, Darci Current, PA-C  HYDROcodone-acetaminophen (NORCO) 7.5-325 MG tablet Take 0.5 tablets by mouth 4 (four) times daily as needed for moderate pain. 11/29/20  Yes [provider]  lisinopril (PRINIVIL,ZESTRIL) 40 MG tablet Take 40 mg by mouth daily. 08/15/16  Yes [provider]  methocarbamol (ROBAXIN) 750 MG tablet Take 750 mg by mouth 2 (two) times daily. 03/16/18  Yes [provider]  predniSONE (DELTASONE) 5 MG tablet Take 1.5 tablets (7.5 mg total) by mouth daily with breakfast for 30 days, THEN 1 tablet (5 mg total) daily with breakfast. 11/28/20 01/27/21 Yes Rice, Jamesetta Orleans, MD  rosuvastatin (CRESTOR) 5 MG tablet Take 5 mg by mouth daily.   Yes [provider]  sucralfate (CARAFATE) 1 g tablet Take 1 g by mouth 4 (four) times daily. 11/30/20  Yes [provider]  HYDROcodone-acetaminophen (NORCO/VICODIN) 5-325 MG tablet Take 1-2 tablets by mouth every 4 (four) hours as needed for moderate pain. Patient not taking: No sig reported 11/28/16   Costella, Darci Current, PA-C    Allergies    Patient has no known allergies.  Review of Systems   Review of Systems  Constitutional:  Positive for appetite change. Negative for fever.  HENT:  Negative for ear pain and sore throat.   Eyes:  Negative for pain and visual disturbance.   Respiratory:  Negative for cough and shortness of breath.   Cardiovascular:  Negative for chest pain and palpitations.  Gastrointestinal:  Positive for abdominal distention, abdominal pain, diarrhea and nausea.  Genitourinary:  Negative for dysuria and hematuria.  Musculoskeletal:  Negative for arthralgias and back pain.  Skin:  Negative for color change and rash.  Neurological:  Negative for syncope and headaches.  All other systems reviewed and are negative.  Physical Exam Updated Vital Signs BP (!) 161/98 (BP Location: Right Arm)   Pulse (!) 111   Temp 98.7 F (37.1 C) (Oral)   Resp 16   SpO2 100%   Physical Exam Constitutional:      General: He is not in acute distress. HENT:     Head: Normocephalic and atraumatic.  Eyes:     Conjunctiva/sclera: Conjunctivae normal.     Pupils: Pupils are equal, round, and reactive to light.  Cardiovascular:  Rate and Rhythm: Normal rate and regular rhythm.  Pulmonary:     Effort: Pulmonary effort is normal. No respiratory distress.  Abdominal:     General: There is no distension.     Tenderness: There is abdominal tenderness in the epigastric area.  Skin:    General: Skin is warm and dry.  Neurological:     General: No focal deficit present.     Mental Status: He is alert. Mental status is at baseline.  Psychiatric:        Mood and Affect: Mood normal.        Behavior: Behavior normal.    ED Results / Procedures / Treatments   Labs (all labs ordered are listed, but only abnormal results are displayed) Labs Reviewed  CBC WITH DIFFERENTIAL/PLATELET - Abnormal; Notable for the following components:      Result Value   WBC 15.0 (*)    Neutro Abs 12.6 (*)    All other components within normal limits  COMPREHENSIVE METABOLIC PANEL - Abnormal; Notable for the following components:   Sodium 134 (*)    ALT 49 (*)    All other components within normal limits  LIPASE, BLOOD - Abnormal; Notable for the following components:    Lipase 138 (*)    All other components within normal limits  URINALYSIS, ROUTINE W REFLEX MICROSCOPIC - Abnormal; Notable for the following components:   Ketones, ur 20 (*)    All other components within normal limits  COMPREHENSIVE METABOLIC PANEL - Abnormal; Notable for the following components:   Sodium 134 (*)    ALT 46 (*)    All other components within normal limits  CBC - Abnormal; Notable for the following components:   WBC 14.9 (*)    RBC 4.12 (*)    MCV 100.2 (*)    All other components within normal limits  RESP PANEL BY RT-PCR (FLU A&B, COVID) ARPGX2  HIV ANTIBODY (ROUTINE TESTING W REFLEX)    EKG None  Radiology CT ABDOMEN PELVIS W CONTRAST  Result Date: 12/02/2020 CLINICAL DATA:  Abdominal cramping and intestinal gas started a few weeks ago EXAM: CT ABDOMEN AND PELVIS WITH CONTRAST TECHNIQUE: Multidetector CT imaging of the abdomen and pelvis was performed using the standard protocol following bolus administration of intravenous contrast. CONTRAST:  OMNIPAQUE IOHEXOL 300 MG/ML  SOLN COMPARISON:  CT lumbar spine 02/17/2017 FINDINGS: Lower chest: The lung bases are clear. There are mild mitral annular calcifications. The imaged heart is otherwise unremarkable. Hepatobiliary: There is a small calcification in the left hepatic lobe along the falciform ligament which may reflect a calcified granuloma. The liver is otherwise unremarkable. The gallbladder is unremarkable. There is no biliary ductal dilatation. Pancreas: There is minimal fat stranding in the fat surrounding the pancreatic tail (6-56). There is no organized peripancreatic fluid collection. The pancreatic parenchyma enhances normally. There is no focal lesion or contour abnormality. There is no main pancreatic ductal dilatation. Spleen: Unremarkable.  There is a small accessory spleen. Adrenals/Urinary Tract: The adrenals are unremarkable. The kidneys are unremarkable, with no focal lesion, stone, hydronephrosis, or  hydroureter. The bladder is unremarkable. Stomach/Bowel: The stomach is unremarkable. There is no evidence of bowel obstruction. There is no abnormal bowel wall thickening or inflammatory change. There are scattered colonic diverticula without evidence of acute diverticulitis. Vascular/Lymphatic: There is dense calcified atherosclerotic plaque throughout the abdominal aorta. There is aneurysmal dilation of the infrarenal abdominal aorta measuring up to 3.8 cm. The major arterial branch vessels are  patent. The main portal and splenic veins are patent. There is no abdominal or pelvic lymphadenopathy. Reproductive: The prostate and seminal vesicles are unremarkable. Other: There is no ascites or free air. There is no evidence of abscess in the abdomen or pelvis. Musculoskeletal: The patient is status post L3-L4 TLIF and bilateral L5-S1 laminectomies. Hardware alignment is within expected limits, without evidence of hardware related complication. 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 There is grade 1 anterolisthesis of L5 on S1, not significantly changed. There is no acute osseous abnormality or aggressive osseous lesion. Coiled presumed abandoned leads are seen in the soft tissues posterior to the sacrum, unchanged since 2018. IMPRESSION: 1. Minimal peripancreatic fat stranding may reflect very mild acute pancreatitis given elevated lipase. 2. Diverticulosis without evidence of acute diverticulitis. 3. 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 4. 3.8 cm infrarenal abdominal aortic aneurysm. Recommend follow-up every 2 years. Reference: J Am Coll Radiol 2013;10:789-794. Electronically Signed   By: Lesia Hausen M.D.   On: 12/02/2020 17:12   US Abdomen Limited RUQ (LIVER/GB)  Result Date: 12/02/2020 CLINICAL DATA:  Right upper quadrant abdominal pain for 3 weeks EXAM: ULTRASOUND ABDOMEN LIMITED RIGHT UPPER QUADRANT  COMPARISON:  None. FINDINGS: Gallbladder: No gallstones or wall thickening visualized. No sonographic Murphy sign noted by sonographer. Common bile duct: Diameter: 4 mm. Liver: No focal lesion identified. Within normal limits in parenchymal echogenicity. Portal vein is patent on color Doppler imaging with normal direction of blood flow towards the liver. Other: None. IMPRESSION: Unremarkable ultrasound of the right upper quadrant. Electronically Signed   By: Duanne Guess D.O.   On: 12/02/2020 14:56    Procedures Procedures   Medications Ordered in ED Medications  enoxaparin (LOVENOX) injection 40 mg (40 mg Subcutaneous Given 12/02/20 2317)  morphine 2 MG/ML injection 2 mg (2 mg Intravenous Given 12/03/20 0814)  ondansetron (ZOFRAN) tablet 4 mg ( Oral See Alternative 12/02/20 2318)    Or  ondansetron (ZOFRAN) injection 4 mg (4 mg Intravenous Given 12/02/20 2318)  0.9 %  sodium chloride infusion ( Intravenous New Bag/Given 12/02/20 2323)  buPROPion (WELLBUTRIN XL) 24 hr tablet 150 mg (has no administration in time range)  gabapentin (NEURONTIN) capsule 600 mg (has no administration in time range)  HYDROcodone-acetaminophen (NORCO) 7.5-325 MG per tablet 0.5 tablet (has no administration in time range)  lisinopril (ZESTRIL) tablet 40 mg (has no administration in time range)  methocarbamol (ROBAXIN) tablet 750 mg (has no administration in time range)  predniSONE (DELTASONE) tablet 7.5 mg (has no administration in time range)  rosuvastatin (CRESTOR) tablet 5 mg (has no administration in time range)  sucralfate (CARAFATE) tablet 1 g (has no administration in time range)  iohexol (OMNIPAQUE) 300 MG/ML solution 100 mL (100 mLs Intravenous Contrast Given 12/02/20 1645)  HYDROmorphone (DILAUDID) injection 1 mg (1 mg Intravenous Given 12/02/20 2317)  sodium chloride 0.9 % bolus 1,000 mL (0 mLs Intravenous Stopped 12/03/20 0042)  diphenhydrAMINE (BENADRYL) injection 25 mg (25 mg Intravenous Given 12/03/20  0206)    ED Course  I have reviewed the triage vital signs and the nursing notes.  Pertinent labs & imaging results that were available during my care of the patient were reviewed by me and considered in my medical decision making (see chart for details).  Ddx includes gastritis vs pancreatitis vs acute biliary disease vs colitis vs renal stone vs other  I personally ordered, reviewed, interpreted his labs,  are notable for an elevated lipase consistent with pancreatitis.  Liver enzymes are within normal limits.  Creatinine is normal.  WBC elevated at 15.0, but patient is on chronic prednisone (down to 7.5 mg daily on slow taper).  UA shows some ketones but no sign of infection.  I ordered and reviewed the patient's imaging which included CT of the abdomen right upper quadrant ultrasound.  No acute biliary disease noted on ultrasound.  CT is consistent with mild pancreatitis with perinephric stranding.  No abscess visible.  The patient is also noted to have an infrarenal aneurysm, which he did not know about before today.  He also said some fusion hardware in his lumbar spine from prior back surgeries.  Discussed his infrarenal aneurysm, which I believe is an incidental finding on CT today, and the need for outpatient follow-up for this with routine screening.  I have a lower suspicion a AAA/dissection is the cause of his symptoms, as these appear to be strictly GI related, exacerbated by drinking, and he does have elevated lipase and CT imaging all consistent with pancreatitis.  This pancreatitis may be related to his alcohol consumption, we discussed needing to stop alcohol.  He reports "I am not a quit today".  He has no history of alcohol withdrawal or seizures.  It is also possible his pancreatitis is triggered by his chronic steroids, or another one of his rheumatoid medications.  He is trying to get off the steroids and is frustrated the wean is taking so long.  However he will need to  discuss this with his rheumatologist.  I have ordered some IV fluids as well as IV Dilaudid for him.  Given how severe and intractable his pain is, I do believe he will need hospital admission at this time.   Final Clinical Impression(s) / ED Diagnoses Final diagnoses:  RUQ abdominal pain  Acute pancreatitis without infection or necrosis, unspecified pancreatitis type  Infrarenal abdominal aortic aneurysm (AAA) without rupture    Rx / DC Orders ED Discharge Orders     None        Terald Sleeper, MD 12/03/20 1117

## 2020-12-02 NOTE — ED Provider Notes (Signed)
Emergency Medicine Provider Triage Evaluation Note  Victor Ramirez , a 65 y.o. male  was evaluated in triage.  Pt complains of abdominal pain.  Patient denies history of previous surgeries.  He does take chronic prednisone and is currently on a couple of months taper.  He reports intermittent gas pains after eating that has become persistently worse and now constant, worse in the left side.  No vomiting but decreased appetite.  No blood in the stool.  Review of Systems  Positive: Abdominal pain Negative: Vomiting  Physical Exam  BP (!) 147/87 (BP Location: Left Arm)   Pulse (!) 104   Temp 98.7 F (37.1 C) (Oral)   Resp 16   SpO2 94%  Gen:   Awake, no distress   Resp:  Normal effort  MSK:   Moves extremities without difficulty  Abd:  Moderate pain with guarding in the right upper quadrant, more mild in the right lower quadrant   Medical Decision Making  Medically screening exam initiated at 2:03 PM.  Appropriate orders placed.  Mariane Masters was informed that the remainder of the evaluation will be completed by another provider, this initial triage assessment does not replace that evaluation, and the importance of remaining in the ED until their evaluation is complete.  Plan: Labs, right upper quadrant ultrasound, may need CT if this is negative.   Renne Crigler, PA-C 12/02/20 1404    Bethann Berkshire, MD 12/07/20 [phone]

## 2020-12-03 DIAGNOSIS — I10 Essential (primary) hypertension: Secondary | ICD-10-CM | POA: Diagnosis not present

## 2020-12-03 DIAGNOSIS — M353 Polymyalgia rheumatica: Secondary | ICD-10-CM

## 2020-12-03 DIAGNOSIS — E782 Mixed hyperlipidemia: Secondary | ICD-10-CM | POA: Diagnosis not present

## 2020-12-03 DIAGNOSIS — R1011 Right upper quadrant pain: Secondary | ICD-10-CM

## 2020-12-03 DIAGNOSIS — M545 Low back pain, unspecified: Secondary | ICD-10-CM

## 2020-12-03 DIAGNOSIS — K852 Alcohol induced acute pancreatitis without necrosis or infection: Secondary | ICD-10-CM

## 2020-12-03 DIAGNOSIS — G8929 Other chronic pain: Secondary | ICD-10-CM

## 2020-12-03 LAB — CBC
HCT: 41.3 % (ref 39.0–52.0)
Hemoglobin: 13.9 g/dL (ref 13.0–17.0)
MCH: 33.7 pg (ref 26.0–34.0)
MCHC: 33.7 g/dL (ref 30.0–36.0)
MCV: 100.2 fL — ABNORMAL HIGH (ref 80.0–100.0)
Platelets: 256 10*3/uL (ref 150–400)
RBC: 4.12 MIL/uL — ABNORMAL LOW (ref 4.22–5.81)
RDW: 12.4 % (ref 11.5–15.5)
WBC: 14.9 10*3/uL — ABNORMAL HIGH (ref 4.0–10.5)
nRBC: 0 % (ref 0.0–0.2)

## 2020-12-03 LAB — COMPREHENSIVE METABOLIC PANEL
ALT: 46 U/L — ABNORMAL HIGH (ref 0–44)
AST: 33 U/L (ref 15–41)
Albumin: 3.6 g/dL (ref 3.5–5.0)
Alkaline Phosphatase: 71 U/L (ref 38–126)
Anion gap: 12 (ref 5–15)
BUN: 11 mg/dL (ref 8–23)
CO2: 22 mmol/L (ref 22–32)
Calcium: 8.9 mg/dL (ref 8.9–10.3)
Chloride: 100 mmol/L (ref 98–111)
Creatinine, Ser: 0.81 mg/dL (ref 0.61–1.24)
GFR, Estimated: >60 mL/min (ref >60)
Glucose, Bld: 77 mg/dL (ref 70–99)
Potassium: 4 mmol/L (ref 3.5–5.1)
Sodium: 134 mmol/L — ABNORMAL LOW (ref 135–145)
Total Bilirubin: 1 mg/dL (ref 0.3–1.2)
Total Protein: 7 g/dL (ref 6.5–8.1)

## 2020-12-03 LAB — RESP PANEL BY RT-PCR (FLU A&B, COVID) ARPGX2
Influenza A by PCR: NEGATIVE
Influenza B by PCR: NEGATIVE
SARS Coronavirus 2 by RT PCR: NEGATIVE

## 2020-12-03 LAB — HIV ANTIBODY (ROUTINE TESTING W REFLEX): HIV Screen 4th Generation wRfx: NONREACTIVE

## 2020-12-03 MED ORDER — HYDROCODONE-ACETAMINOPHEN 7.5-325 MG PO TABS: 0.5000 | ORAL_TABLET | Freq: Four times a day (QID) | ORAL | Status: DC | PRN

## 2020-12-03 MED ORDER — LISINOPRIL 40 MG PO TABS
40.0000 mg | ORAL_TABLET | Freq: Every day | ORAL | Status: DC
  Administered 2020-12-03 – 2020-12-04 (×2): 40 mg via ORAL
  Filled 2020-12-03: qty 1
  Filled 2020-12-03: qty 2

## 2020-12-03 MED ORDER — GABAPENTIN 300 MG PO CAPS
600.0000 mg | ORAL_CAPSULE | Freq: Two times a day (BID) | ORAL | Status: DC
  Administered 2020-12-03 – 2020-12-04 (×3): 600 mg via ORAL
  Filled 2020-12-03 (×3): qty 2

## 2020-12-03 MED ORDER — ROSUVASTATIN CALCIUM 5 MG PO TABS
5.0000 mg | ORAL_TABLET | Freq: Every day | ORAL | Status: DC
  Administered 2020-12-03 – 2020-12-04 (×2): 5 mg via ORAL
  Filled 2020-12-03 (×2): qty 1

## 2020-12-03 MED ORDER — DIPHENHYDRAMINE HCL 50 MG/ML IJ SOLN
25.0000 mg | Freq: Every evening | INTRAMUSCULAR | Status: AC | PRN
Start: 2020-12-03 — End: 2020-12-03
  Administered 2020-12-03: 25 mg via INTRAVENOUS
  Filled 2020-12-03: qty 1

## 2020-12-03 MED ORDER — SUCRALFATE 1 G PO TABS: 1.0000 g | ORAL_TABLET | Freq: Four times a day (QID) | ORAL | Status: DC | PRN

## 2020-12-03 MED ORDER — BUPROPION HCL ER (XL) 150 MG PO TB24
150.0000 mg | ORAL_TABLET | Freq: Every morning | ORAL | Status: DC
  Administered 2020-12-03 – 2020-12-04 (×2): 150 mg via ORAL
  Filled 2020-12-03 (×2): qty 1

## 2020-12-03 MED ORDER — PREDNISONE 5 MG PO TABS
7.5000 mg | ORAL_TABLET | Freq: Every day | ORAL | Status: DC
  Administered 2020-12-03 – 2020-12-04 (×2): 7.5 mg via ORAL
  Filled 2020-12-03: qty 1
  Filled 2020-12-03: qty 2
  Filled 2020-12-03: qty 1

## 2020-12-03 MED ORDER — METHOCARBAMOL 750 MG PO TABS
750.0000 mg | ORAL_TABLET | Freq: Two times a day (BID) | ORAL | Status: DC
  Administered 2020-12-03 – 2020-12-04 (×3): 750 mg via ORAL
  Filled 2020-12-03: qty 2
  Filled 2020-12-03: qty 1
  Filled 2020-12-03: qty 2

## 2020-12-03 NOTE — ED Notes (Signed)
Report given to Jacquelyn D, RN  

## 2020-12-03 NOTE — ED Notes (Signed)
Pt ambulatory to RR w no assistance. VSS, NAD

## 2020-12-03 NOTE — Progress Notes (Signed)
PROGRESS NOTE  Victor Ramirez    DOB: December 22, 1955, 65 y.o.  TXM:[id number]  PCP: Wilfrid Lund, PA   Code Status: Full Code   DOA: 12/02/2020   LOS: 0  Brief Narrative of Current Hospitalization  Victor Ramirez is a 65 y.o. male with a PMH significant for polymyalgia rheumatica, HTN, HLD, chronic back pain, H/o pancreatitis. They presented from home to the ED on 12/02/2020 with right upper quadrant abdominal pain x 7 days. In the ED, it was found that they had mild pancreatitis. They were treated with n.p.o., IV fluids, pain medications.  Patient was admitted to medicine service for further workup and management of pancreatitis as outlined in detail below.  12/03/20 -much improved  Assessment & Plan  Principal Problem:   Acute pancreatitis without infection or necrosis Active Problems:   Chronic back pain   HTN (hypertension)   Hyperlipemia   Polymyalgia rheumatica (HCC)  Acute mild pancreatitis-pain improved.  Denies nausea or vomiting.  Feels like he can eat. -Advance diet to clear liquids today -Continue IV fluid maintenance -Continue as needed pain medication  Chronic back pain -Continue home gabapentin, methocarbamol  Polymyalgia rheumatica- chronic -Continue steroids  Depression/anxiety- chronic -Continue bupropion  HTN- chronic, elevated this admission to 160s systolics -Continue lisinopril, rosuvastatin  GERD -Continue home sucralfate  DVT prophylaxis: enoxaparin (LOVENOX) injection 40 mg Start: 12/02/20 2315   Diet:  Diet Orders (From admission, onward)     Start     Ordered   12/03/20 0854  Diet clear liquid Room service appropriate? Yes; Fluid consistency: Thin  Diet effective now       Question Answer Comment  Room service appropriate? Yes   Fluid consistency: Thin      12/03/20 0853            Subjective 12/03/20    Pt reports feeling much better today.  Denies nausea/vomiting.  He would like to eat today.  Disposition Plan &  Communication  Status is: Observation  The patient remains OBS appropriate and will d/c before 2 midnights.  Dispo: The patient is from: Home              Anticipated d/c is to: Home              Patient currently is not medically stable to d/c.   Difficult to place patient No   Family Communication: None  Consults, Procedures, Significant Events  Consultants:  None  Procedures/significant events:  None  Antimicrobials:  Anti-infectives (From admission, onward)    None        Objective   Vitals:   12/03/20 0500 12/03/20 0515 12/03/20 0600 12/03/20 0656  BP: (!) 156/97 (!) 156/95 (!) 159/92 (!) 161/98  Pulse: 95 (!) 102 90 (!) 111  Resp: (!) 23 19 17 16   Temp:      TempSrc:      SpO2: 100% 100% 100% 100%   No intake or output data in the 24 hours ending 12/03/20 1042 There were no vitals filed for this visit.  Patient BMI: There is no height or weight on file to calculate BMI.   Physical Exam: General: awake, alert, NAD HEENT: atraumatic, clear conjunctiva, anicteric sclera, moist mucus membranes, hearing grossly normal Respiratory: normal respiratory effort. Cardiovascular: normal S1/S2, RRR, no JVD, murmurs, rubs, gallops, quick capillary refill  Gastrointestinal: soft, mild tenderness to epigastric area without rebound, ND, no HSM felt Nervous: A&O x4. no gross focal neurologic deficits, normal speech Extremities: moves  all equally, no edema, normal tone Skin: dry, intact, normal temperature, normal color, No rashes, lesions or ulcers Psychiatry: normal mood, congruent affect  Labs   I have personally reviewed following labs and imaging studies Admission on 12/02/2020  Component Date Value Ref Range Status   WBC 12/02/2020 15.0 (A) 4.0 - 10.5 K/uL Final   RBC 12/02/2020 4.33  4.22 - 5.81 MIL/uL Final   Hemoglobin 12/02/2020 14.6  13.0 - 17.0 g/dL Final   HCT 36/62/9476 42.4  39.0 - 52.0 % Final   MCV 12/02/2020 97.9  80.0 - 100.0 fL Final   MCH  12/02/2020 33.7  26.0 - 34.0 pg Final   MCHC 12/02/2020 34.4  30.0 - 36.0 g/dL Final   RDW 54/65/0354 12.4  11.5 - 15.5 % Final   Platelets 12/02/2020 263  150 - 400 K/uL Final   nRBC 12/02/2020 0.0  0.0 - 0.2 % Final   Neutrophils Relative % 12/02/2020 84  % Final   Neutro Abs 12/02/2020 12.6 (A) 1.7 - 7.7 K/uL Final   Lymphocytes Relative 12/02/2020 9  % Final   Lymphs Abs 12/02/2020 1.4  0.7 - 4.0 K/uL Final   Monocytes Relative 12/02/2020 6  % Final   Monocytes Absolute 12/02/2020 0.9  0.1 - 1.0 K/uL Final   Eosinophils Relative 12/02/2020 0  % Final   Eosinophils Absolute 12/02/2020 0.0  0.0 - 0.5 K/uL Final   Basophils Relative 12/02/2020 0  % Final   Basophils Absolute 12/02/2020 0.0  0.0 - 0.1 K/uL Final   Immature Granulocytes 12/02/2020 1  % Final   Abs Immature Granulocytes 12/02/2020 0.07  0.00 - 0.07 K/uL Final   Sodium 12/02/2020 134 (A) 135 - 145 mmol/L Final   Potassium 12/02/2020 4.8  3.5 - 5.1 mmol/L Final   Chloride 12/02/2020 102  98 - 111 mmol/L Final   CO2 12/02/2020 23  22 - 32 mmol/L Final   Glucose, Bld 12/02/2020 94  70 - 99 mg/dL Final   BUN 65/68/1275 12  8 - 23 mg/dL Final   Creatinine, Ser 12/02/2020 0.78  0.61 - 1.24 mg/dL Final   Calcium 17/00/1749 9.3  8.9 - 10.3 mg/dL Final   Total Protein 44/96/7591 7.3  6.5 - 8.1 g/dL Final   Albumin 63/84/6659 3.7  3.5 - 5.0 g/dL Final   AST 93/57/0177 31  15 - 41 U/L Final   ALT 12/02/2020 49 (A) 0 - 44 U/L Final   Alkaline Phosphatase 12/02/2020 68  38 - 126 U/L Final   Total Bilirubin 12/02/2020 0.7  0.3 - 1.2 mg/dL Final   GFR, Estimated 12/02/2020 >60  >60 mL/min Final   Anion gap 12/02/2020 9  5 - 15 Final   Lipase 12/02/2020 138 (A) 11 - 51 U/L Final   Color, Urine 12/02/2020 YELLOW  YELLOW Final   APPearance 12/02/2020 CLEAR  CLEAR Final   Specific Gravity, Urine 12/02/2020 1.016  1.005 - 1.030 Final   pH 12/02/2020 6.0  5.0 - 8.0 Final   Glucose, UA 12/02/2020 NEGATIVE  NEGATIVE mg/dL Final   Hgb  urine dipstick 12/02/2020 NEGATIVE  NEGATIVE Final   Bilirubin Urine 12/02/2020 NEGATIVE  NEGATIVE Final   Ketones, ur 12/02/2020 20 (A) NEGATIVE mg/dL Final   Protein, ur 93/90/3009 NEGATIVE  NEGATIVE mg/dL Final   Nitrite 23/30/0762 NEGATIVE  NEGATIVE Final   Leukocytes,Ua 12/02/2020 NEGATIVE  NEGATIVE Final   SARS Coronavirus 2 by RT PCR 12/02/2020 NEGATIVE  NEGATIVE Final   Influenza A by PCR  12/02/2020 NEGATIVE  NEGATIVE Final   Influenza B by PCR 12/02/2020 NEGATIVE  NEGATIVE Final   HIV Screen 4th Generation wRfx 12/03/2020 Non Reactive  Non Reactive Final   Sodium 12/03/2020 134 (A) 135 - 145 mmol/L Final   Potassium 12/03/2020 4.0  3.5 - 5.1 mmol/L Final   Chloride 12/03/2020 100  98 - 111 mmol/L Final   CO2 12/03/2020 22  22 - 32 mmol/L Final   Glucose, Bld 12/03/2020 77  70 - 99 mg/dL Final   BUN 15/17/6160 11  8 - 23 mg/dL Final   Creatinine, Ser 12/03/2020 0.81  0.61 - 1.24 mg/dL Final   Calcium 73/71/0626 8.9  8.9 - 10.3 mg/dL Final   Total Protein 94/85/4627 7.0  6.5 - 8.1 g/dL Final   Albumin 03/50/0938 3.6  3.5 - 5.0 g/dL Final   AST 18/29/9371 33  15 - 41 U/L Final   ALT 12/03/2020 46 (A) 0 - 44 U/L Final   Alkaline Phosphatase 12/03/2020 71  38 - 126 U/L Final   Total Bilirubin 12/03/2020 1.0  0.3 - 1.2 mg/dL Final   GFR, Estimated 12/03/2020 >60  >60 mL/min Final   Anion gap 12/03/2020 12  5 - 15 Final   WBC 12/03/2020 14.9 (A) 4.0 - 10.5 K/uL Final   RBC 12/03/2020 4.12 (A) 4.22 - 5.81 MIL/uL Final   Hemoglobin 12/03/2020 13.9  13.0 - 17.0 g/dL Final   HCT 69/67/8938 41.3  39.0 - 52.0 % Final   MCV 12/03/2020 100.2 (A) 80.0 - 100.0 fL Final   MCH 12/03/2020 33.7  26.0 - 34.0 pg Final   MCHC 12/03/2020 33.7  30.0 - 36.0 g/dL Final   RDW 12/16/5100 12.4  11.5 - 15.5 % Final   Platelets 12/03/2020 256  150 - 400 K/uL Final   nRBC 12/03/2020 0.0  0.0 - 0.2 % Final    Imaging Studies  CT ABDOMEN PELVIS W CONTRAST  Result Date: 12/02/2020 CLINICAL DATA:   Abdominal cramping and intestinal gas started a few weeks ago EXAM: CT ABDOMEN AND PELVIS WITH CONTRAST TECHNIQUE: Multidetector CT imaging of the abdomen and pelvis was performed using the standard protocol following bolus administration of intravenous contrast. CONTRAST:  OMNIPAQUE IOHEXOL 300 MG/ML  SOLN COMPARISON:  CT lumbar spine 02/17/2017 FINDINGS: Lower chest: The lung bases are clear. There are mild mitral annular calcifications. The imaged heart is otherwise unremarkable. Hepatobiliary: There is a small calcification in the left hepatic lobe along the falciform ligament which may reflect a calcified granuloma. The liver is otherwise unremarkable. The gallbladder is unremarkable. There is no biliary ductal dilatation. Pancreas: There is minimal fat stranding in the fat surrounding the pancreatic tail (6-56). There is no organized peripancreatic fluid collection. The pancreatic parenchyma enhances normally. There is no focal lesion or contour abnormality. There is no main pancreatic ductal dilatation. Spleen: Unremarkable.  There is a small accessory spleen. Adrenals/Urinary Tract: The adrenals are unremarkable. The kidneys are unremarkable, with no focal lesion, stone, hydronephrosis, or hydroureter. The bladder is unremarkable. Stomach/Bowel: The stomach is unremarkable. There is no evidence of bowel obstruction. There is no abnormal bowel wall thickening or inflammatory change. There are scattered colonic diverticula without evidence of acute diverticulitis. Vascular/Lymphatic: There is dense calcified atherosclerotic plaque throughout the abdominal aorta. There is aneurysmal dilation of the infrarenal abdominal aorta measuring up to 3.8 cm. The major arterial branch vessels are patent. The main portal and splenic veins are patent. There is no abdominal or pelvic lymphadenopathy. Reproductive:  The prostate and seminal vesicles are unremarkable. Other: There is no ascites or free air. There is no  evidence of abscess in the abdomen or pelvis. Musculoskeletal: The patient is status post L3-L4 TLIF and bilateral L5-S1 laminectomies. Hardware alignment is within expected limits, without evidence of hardware related complication. 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 There is grade 1 anterolisthesis of L5 on S1, not significantly changed. There is no acute osseous abnormality or aggressive osseous lesion. Coiled presumed abandoned leads are seen in the soft tissues posterior to the sacrum, unchanged since 2018. IMPRESSION: 1. Minimal peripancreatic fat stranding may reflect very mild acute pancreatitis given elevated lipase. 2. Diverticulosis without evidence of acute diverticulitis. 3. 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 4. 3.8 cm infrarenal abdominal aortic aneurysm. Recommend follow-up every 2 years. Reference: J Am Coll Radiol 2013;10:789-794. Electronically Signed   By: Lesia Hausen M.D.   On: 12/02/2020 17:12   US Abdomen Limited RUQ (LIVER/GB)  Result Date: 12/02/2020 CLINICAL DATA:  Right upper quadrant abdominal pain for 3 weeks EXAM: ULTRASOUND ABDOMEN LIMITED RIGHT UPPER QUADRANT COMPARISON:  None. FINDINGS: Gallbladder: No gallstones or wall thickening visualized. No sonographic Murphy sign noted by sonographer. Common bile duct: Diameter: 4 mm. Liver: No focal lesion identified. Within normal limits in parenchymal echogenicity. Portal vein is patent on color Doppler imaging with normal direction of blood flow towards the liver. Other: None. IMPRESSION: Unremarkable ultrasound of the right upper quadrant. Electronically Signed   By: Duanne Guess D.O.   On: 12/02/2020 14:56   Medications   Scheduled Meds:  enoxaparin (LOVENOX) injection  40 mg Subcutaneous Q24H     LOS: 0 days   Time spent: >27min  Leeroy Bock, DO [facility] 12/03/2020, 10:42 AM   To contact the  [facility] Attending or Consulting provider for this patient: Check the care team in [facility] for a) attending/consulting [facility] provider listed and b) the [facility] team listed Log into [email or web address] and use [facility]'s universal password to access. If you do not have the password, please contact the hospital operator. Locate the [facility] provider you are looking for under [facility] and page to a number that you can be directly reached. If you still have difficulty reaching the provider, please page the [facility] (Director on Call) for the Hospitalists listed on amion for assistance.

## 2020-12-04 DIAGNOSIS — K852 Alcohol induced acute pancreatitis without necrosis or infection: Secondary | ICD-10-CM | POA: Diagnosis not present

## 2020-12-04 MED ORDER — INFLUENZA VAC SPLIT QUAD 0.5 ML IM SUSY: 0.5000 mL | PREFILLED_SYRINGE | INTRAMUSCULAR | Status: DC

## 2020-12-04 MED ORDER — MELATONIN 3 MG PO TABS
3.0000 mg | ORAL_TABLET | Freq: Every day | ORAL | Status: DC
  Administered 2020-12-04: 3 mg via ORAL
  Filled 2020-12-04: qty 1

## 2020-12-04 NOTE — Progress Notes (Signed)
RN gave patient discharge instructions and the patient stated understanding. No new medications added IV has been removed. Pt drove himself here car in [place].

## 2020-12-04 NOTE — Care Management Obs Status (Signed)
MEDICARE OBSERVATION STATUS NOTIFICATION   Patient Details  Name: Victor Ramirez MRN: [id number] Date of Birth: [birth year]   Medicare Observation Status Notification Given:  Yes    Lawerance Sabal, RN 12/04/2020, 9:04 AM

## 2020-12-08 NOTE — Discharge Summary (Signed)
Physician Discharge Summary   Patient name: Victor Ramirez  Admit date:     12/02/2020  Discharge date: 12/08/2020  Attending Physician: Charlsie Quest [[id number]]  Discharge Physician: Lynden Oxford   PCP: Wilfrid Lund, PA    Follow-up Information     Wilfrid Lund, PA. Schedule an appointment as soon as possible for a visit in 1 week(s).   Specialty: Family Medicine Contact information: [address] [place] [address] [phone]                 Recommendations at discharge: Follow-up with PCP in 1 week.  Discharge Diagnoses Principal Problem:   Acute pancreatitis without infection or necrosis Active Problems:   Chronic back pain   HTN (hypertension)   Hyperlipemia   Polymyalgia rheumatica (HCC)   Resolved Diagnoses Resolved Problems:   * No resolved hospital problems. [facility] Course   Acute pancreatitis: Appears to be mild pancreatitis based on CT imaging and lipase level although this could be a downtrending lipase given late presentation.  Etiology unclear.   RUQ ultrasound is normal.   Could be alcohol related.  Although patient drinks only 1-2 beers a day. a few medications (prednisone, lisinopril, rosuvastatin) which show very limited data of association with pancreatitis, feel these are less likely. Tolerating oral diet. Pain resolved. Outpatient follow-up with PCP recommended.   Polymyalgia rheumatica: Follows with rheumatology, Dr. Dimple Casey.  On slow prednisone taper, currently 7.5 mg daily   Leukocytosis: Likely reactive plus associated with steroid use.   Hypertension: Resume home medication lisinopril.   Hyperlipidemia: on rosuvastatin.   Chronic back pain: S/p L3-L4 TLIF.  On chronic Norco   Depression: Bupropion resume  Infrarenal abdominal aortic aneurysm: 3.4 cm infrarenal abdominal aortic aneurysm noted on CT imaging.  Follow-up every 2 years recommended.  Findings discussed with patient.  Procedures  performed: none   Condition at discharge: good  Exam General: Appear in mild distress, no Rash; Oral Mucosa Clear, moist. no Abnormal Neck Mass Or lumps, Conjunctiva normal  Cardiovascular: S1 and S2 Present, no Murmur, Respiratory: good respiratory effort, Bilateral Air entry present and CTA, no Crackles, no wheezes Abdomen: Bowel Sound present, Soft and no tenderness Extremities: no Pedal edema Neurology: alert and oriented to time, place, and person affect appropriate. no new focal deficit Gait not checked due to patient safety concerns     Disposition: Home  Discharge time: greater than 30 minutes. Allergies as of 12/04/2020   No Known Allergies      Medication List     TAKE these medications    buPROPion 150 MG 24 hr tablet Commonly known as: WELLBUTRIN XL Take 150 mg by mouth every morning.   cetirizine 10 MG tablet Commonly known as: ZYRTEC Take 10 mg by mouth daily.   fluticasone 50 MCG/ACT nasal spray Commonly known as: FLONASE Place 2 sprays into both nostrils daily.   gabapentin 300 MG capsule Commonly known as: NEURONTIN Take 1 capsule (300 mg total) by mouth 3 (three) times daily. What changed:  how much to take when to take this   HYDROcodone-acetaminophen 7.5-325 MG tablet Commonly known as: NORCO Take 0.5 tablets by mouth 4 (four) times daily as needed for moderate pain. What changed: Another medication with the same name was removed. Continue taking this medication, and follow the directions you see here.   lisinopril 40 MG tablet Commonly known as: ZESTRIL Take 40 mg by mouth daily.   methocarbamol 750  MG tablet Commonly known as: ROBAXIN Take 750 mg by mouth 2 (two) times daily.   predniSONE 5 MG tablet Commonly known as: DELTASONE Take 1.5 tablets (7.5 mg total) by mouth daily with breakfast for 30 days, THEN 1 tablet (5 mg total) daily with breakfast. Start taking on: November 28, 2020   rosuvastatin 5 MG tablet Commonly known as:  CRESTOR Take 5 mg by mouth daily.   sucralfate 1 g tablet Commonly known as: CARAFATE Take 1 g by mouth 4 (four) times daily.        CT ABDOMEN PELVIS W CONTRAST  Result Date: 12/02/2020 CLINICAL DATA:  Abdominal cramping and intestinal gas started a few weeks ago EXAM: CT ABDOMEN AND PELVIS WITH CONTRAST TECHNIQUE: Multidetector CT imaging of the abdomen and pelvis was performed using the standard protocol following bolus administration of intravenous contrast. CONTRAST:  OMNIPAQUE IOHEXOL 300 MG/ML  SOLN COMPARISON:  CT lumbar spine 02/17/2017 FINDINGS: Lower chest: The lung bases are clear. There are mild mitral annular calcifications. The imaged heart is otherwise unremarkable. Hepatobiliary: There is a small calcification in the left hepatic lobe along the falciform ligament which may reflect a calcified granuloma. The liver is otherwise unremarkable. The gallbladder is unremarkable. There is no biliary ductal dilatation. Pancreas: There is minimal fat stranding in the fat surrounding the pancreatic tail (6-56). There is no organized peripancreatic fluid collection. The pancreatic parenchyma enhances normally. There is no focal lesion or contour abnormality. There is no main pancreatic ductal dilatation. Spleen: Unremarkable.  There is a small accessory spleen. Adrenals/Urinary Tract: The adrenals are unremarkable. The kidneys are unremarkable, with no focal lesion, stone, hydronephrosis, or hydroureter. The bladder is unremarkable. Stomach/Bowel: The stomach is unremarkable. There is no evidence of bowel obstruction. There is no abnormal bowel wall thickening or inflammatory change. There are scattered colonic diverticula without evidence of acute diverticulitis. Vascular/Lymphatic: There is dense calcified atherosclerotic plaque throughout the abdominal aorta. There is aneurysmal dilation of the infrarenal abdominal aorta measuring up to 3.8 cm. The major arterial branch vessels are patent.  The main portal and splenic veins are patent. There is no abdominal or pelvic lymphadenopathy. Reproductive: The prostate and seminal vesicles are unremarkable. Other: There is no ascites or free air. There is no evidence of abscess in the abdomen or pelvis. Musculoskeletal: The patient is status post L3-L4 TLIF and bilateral L5-S1 laminectomies. Hardware alignment is within expected limits, without evidence of hardware related complication. 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 There is grade 1 anterolisthesis of L5 on S1, not significantly changed. There is no acute osseous abnormality or aggressive osseous lesion. Coiled presumed abandoned leads are seen in the soft tissues posterior to the sacrum, unchanged since 2018. IMPRESSION: 1. Minimal peripancreatic fat stranding may reflect very mild acute pancreatitis given elevated lipase. 2. Diverticulosis without evidence of acute diverticulitis. 3. 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 4. 3.8 cm infrarenal abdominal aortic aneurysm. Recommend follow-up every 2 years. Reference: J Am Coll Radiol 2013;10:789-794. Electronically Signed   By: Lesia Hausen M.D.   On: 12/02/2020 17:12   US Abdomen Limited RUQ (LIVER/GB)  Result Date: 12/02/2020 CLINICAL DATA:  Right upper quadrant abdominal pain for 3 weeks EXAM: ULTRASOUND ABDOMEN LIMITED RIGHT UPPER QUADRANT COMPARISON:  None. FINDINGS: Gallbladder: No gallstones or wall thickening visualized. No sonographic Murphy sign noted by sonographer. Common bile duct: Diameter: 4 mm. Liver: No focal lesion identified.  Within normal limits in parenchymal echogenicity. Portal vein is patent on color Doppler imaging with normal direction of blood flow towards the liver. Other: None. IMPRESSION: Unremarkable ultrasound of the right upper quadrant. Electronically Signed   By: Duanne Guess D.O.   On: 12/02/2020 14:56    Results for orders placed or performed during the hospital encounter of 12/02/20  Resp Panel by RT-PCR (Flu A&B, Covid) Nasopharyngeal Swab     Status: None   Collection Time: 12/02/20 10:17 PM   Specimen: Nasopharyngeal Swab; Nasopharyngeal(NP) swabs in vial transport medium  Result Value Ref Range Status   SARS Coronavirus 2 by RT PCR NEGATIVE NEGATIVE Final    Comment: (NOTE) SARS-CoV-2 target nucleic acids are NOT DETECTED.  The SARS-CoV-2 RNA is generally detectable in upper respiratory specimens during the acute phase of infection. The lowest concentration of SARS-CoV-2 viral copies this assay can detect is 138 copies/mL. A negative result does not preclude SARS-Cov-2 infection and should not be used as the sole basis for treatment or other patient management decisions. A negative result may occur with  improper specimen collection/handling, submission of specimen other than nasopharyngeal swab, presence of viral mutation(s) within the areas targeted by this assay, and inadequate number of viral copies(<138 copies/mL). A negative result must be combined with clinical observations, patient history, and epidemiological information. The expected result is Negative.  Fact Sheet for Patients:  BloggerCourse.com  Fact Sheet for Healthcare Providers:  SeriousBroker.it  This test is no t yet approved or cleared by the [place] FDA and  has been authorized for detection and/or diagnosis of SARS-CoV-2 by FDA under an Emergency Use Authorization (EUA). This EUA will remain  in effect (meaning this test can be used) for the duration of the COVID-19 declaration under Section 564(b)(1) of the Act, 21 U.S.C.section 360bbb-3(b)(1), unless the authorization is terminated  or revoked sooner.       Influenza A by PCR NEGATIVE NEGATIVE Final   Influenza B by PCR NEGATIVE NEGATIVE Final    Comment: (NOTE) The Xpert Xpress  SARS-CoV-2/FLU/RSV plus assay is intended as an aid in the diagnosis of influenza from Nasopharyngeal swab specimens and should not be used as a sole basis for treatment. Nasal washings and aspirates are unacceptable for Xpert Xpress SARS-CoV-2/FLU/RSV testing.  Fact Sheet for Patients: BloggerCourse.com  Fact Sheet for Healthcare Providers: SeriousBroker.it  This test is not yet approved or cleared by the [place] FDA and has been authorized for detection and/or diagnosis of SARS-CoV-2 by FDA under an Emergency Use Authorization (EUA). This EUA will remain in effect (meaning this test can be used) for the duration of the COVID-19 declaration under Section 564(b)(1) of the Act, 21 U.S.C. section 360bbb-3(b)(1), unless the authorization is terminated or revoked.  Performed at [facility], [address] [place], [place] [address]     Signed:  Lynden Oxford MD.  [facility] 12/08/2020, 9:13 AM

## 2020-12-13 DIAGNOSIS — K859 Acute pancreatitis without necrosis or infection, unspecified: Secondary | ICD-10-CM | POA: Diagnosis not present

## 2020-12-13 DIAGNOSIS — I10 Essential (primary) hypertension: Secondary | ICD-10-CM | POA: Diagnosis not present

## 2020-12-13 DIAGNOSIS — F321 Major depressive disorder, single episode, moderate: Secondary | ICD-10-CM | POA: Diagnosis not present

## 2020-12-13 DIAGNOSIS — E782 Mixed hyperlipidemia: Secondary | ICD-10-CM | POA: Diagnosis not present

## 2021-01-27 NOTE — Progress Notes (Deleted)
Office Visit Note  Patient: Victor Ramirez             Date of Birth: 01/10/1956           MRN: [id number]             PCP: Lois Huxley, PA Referring: Lois Huxley, [place] Visit Date: 01/28/2021   Subjective:  No chief complaint on file.   History of Present Illness: KAREY COLORADO is a 65 y.o. male here for follow up for PMR tapering prednisone down from 10 mg to 5 mg daily also after left shoulder steroid injection. ***   Previous HPI 11/28/20 GREY TURKNETT is a 65 y.o. male here for follow up of PMR on prednisone 10 mg PO daily. He stopped methotrexate after our last visit due to abnormal LFT elevation that resolved after stopping. He feels symptoms are better and worse one day vs the next but overall slowly improving. He still has neck pain with rotation shooting down the right side rarely. His left shoulder is hurting more than before especially with reaching overhead, lying on his left side, reaching up to steering wheel.     08/08/20 AMARE CHESMORE is a 65 y.o. male referred for PMR. His symptoms started abruptly with severe pain in bilateral shoulders limiting use and mobility especially first thing in the morning.  He does not require any preceding injury or changes in health or medications. For evaluation and primary care clinic with x-ray imaging obtained that did not show any structural cause of symptoms he had markedly high inflammatory markers and was started on oral prednisone for suspected PMR.  He noticed dramatic improvement in symptoms within 2 days of starting the medicine.  At follow-up he was recommended to decrease the dose down to 15 mg of prednisone at which time he noticed a return of his symptoms.  He increased back to 20 mg and feels this is doing well again.  The most affected areas were in the base of his neck and bilateral upper back and shoulders with radiation down the upper portion of the arms.  He also felt like it was affecting his right hip and thigh  much worse than left, where he has chronic deficits due to history of polio.  He has started to notice some weight gain with the oral prednisone otherwise no specific side effect problems.   No Rheumatology ROS completed.   PMFS History:  Patient Active Problem List   Diagnosis Date Noted   RUQ abdominal pain    Acute pancreatitis without infection or necrosis 12/02/2020   Pain in left shoulder 11/28/2020   Rheumatoid factor positive 08/29/2020   Polymyalgia rheumatica ([place]) 08/08/2020   Polyarthralgia 08/08/2020   High risk medication use 08/08/2020   Lumbosacral spondylosis with radiculopathy 11/27/2016   Atrophy of muscle of right lower leg 02/04/2014   History of post-polio syndrome 02/02/2014   Pain of left lower extremity 02/02/2014   Chronic back pain 05/14/2013   HTN (hypertension) 05/14/2013   Headache 12/22/2011   Sleep apnea 11/17/2011   Elevation of level of transaminase or lactic acid dehydrogenase (LDH) 09/18/2010   Anxiety 09/08/2010   Tachycardia 09/08/2010   Hyperlipemia 03/01/2006   TIA (transient ischemic attack) 03/01/2006   Tobacco use disorder 03/01/2006   Depressive disorder 10/16/2005    Past Medical History:  Diagnosis Date   Hemochromatosis    Hypertension     Family History  Problem Relation Age of Onset  Arthritis Mother    Thyroid cancer Mother    Melanoma Father    Fibromyalgia Sister    Cancer Brother    Prostate cancer Brother    Williams syndrome Son    Past Surgical History:  Procedure Laterality Date   ANTERIOR LATERAL LUMBAR FUSION WITH PERCUTANEOUS SCREW 1 LEVEL Right 11/27/2016   Procedure: Lumbar Three-Four Transpsoas Lumbar InterbodyFfusion;  Surgeon: Ditty, Loura Halt, MD;  Location: [facility] OR;  Service: Neurosurgery;  Laterality: Right;  L3-4 Transpsoas lumbar interbody fusion/L3-4 Pedicle screw fixation with posterolateral arthrodesis/minimally invasive decompression/Mazor   APPLICATION OF ROBOTIC ASSISTANCE FOR SPINAL  PROCEDURE N/A 11/27/2016   Procedure: Lumbar Three-Four Pedicle Screw Fixation with Posterolateral Arthrodesis with Minimally Invasive Decompression with Application of Robotic Assistance;  Surgeon: Ditty, Loura Halt, MD;  Location: [facility] OR;  Service: Neurosurgery;  Laterality: N/A;   BACK SURGERY     x3    BACK SURGERY     battery surgery    spinal manipulation under anesthesia     Social History   Social History Narrative   Not on file   Immunization History  Administered Date(s) Administered   PFIZER(Purple Top)SARS-COV-2 Vaccination 06/01/2019, 07/01/2019     Objective: Vital Signs: There were no vitals taken for this visit.   Physical Exam   Musculoskeletal Exam: ***  CDAI Exam: CDAI Score: -- Patient Global: --; Provider Global: -- Swollen: --; Tender: -- Joint Exam 01/28/2021   No joint exam has been documented for this visit   There is currently no information documented on the homunculus. Go to the Rheumatology activity and complete the homunculus joint exam.  Investigation: No additional findings.  Imaging: No results found.  Recent Labs: Lab Results  Component Value Date   WBC 14.9 (H) 12/03/2020   HGB 13.9 12/03/2020   PLT 256 12/03/2020   NA 134 (L) 12/03/2020   K 4.0 12/03/2020   CL 100 12/03/2020   CO2 22 12/03/2020   GLUCOSE 77 12/03/2020   BUN 11 12/03/2020   CREATININE 0.81 12/03/2020   BILITOT 1.0 12/03/2020   ALKPHOS 71 12/03/2020   AST 33 12/03/2020   ALT 46 (H) 12/03/2020   PROT 7.0 12/03/2020   ALBUMIN 3.6 12/03/2020   CALCIUM 8.9 12/03/2020   GFRAA 111 08/08/2020    Speciality Comments: No specialty comments available.  Procedures:  No procedures performed Allergies: Patient has no known allergies.   Assessment / Plan:     Visit Diagnoses: No diagnosis found.  ***  Orders: No orders of the defined types were placed in this encounter.  No orders of the defined types were placed in this encounter.    Follow-Up  Instructions: No follow-ups on file.   Fuller Plan, MD  Note - This record has been created using [employer].  Chart creation errors have been sought, but may not always  have been located. Such creation errors do not reflect on  the standard of medical care.

## 2021-01-28 ENCOUNTER — Ambulatory Visit: Payer: Medicare Other | Admitting: Internal Medicine

## 2021-01-29 ENCOUNTER — Ambulatory Visit (INDEPENDENT_AMBULATORY_CARE_PROVIDER_SITE_OTHER): Payer: Medicare Other | Admitting: Internal Medicine

## 2021-01-29 ENCOUNTER — Other Ambulatory Visit: Payer: Self-pay

## 2021-01-29 ENCOUNTER — Encounter: Payer: Self-pay | Admitting: Internal Medicine

## 2021-01-29 VITALS — BP 130/86 | HR 97 | Resp 12 | Ht 68.0 in | Wt 190.0 lb

## 2021-01-29 DIAGNOSIS — M25512 Pain in left shoulder: Secondary | ICD-10-CM

## 2021-01-29 DIAGNOSIS — G8929 Other chronic pain: Secondary | ICD-10-CM | POA: Diagnosis not present

## 2021-01-29 DIAGNOSIS — M353 Polymyalgia rheumatica: Secondary | ICD-10-CM | POA: Diagnosis not present

## 2021-01-29 MED ORDER — PREDNISONE 5 MG PO TABS: 5.0000 mg | ORAL_TABLET | Freq: Every day | ORAL | 2 refills | Status: DC

## 2021-01-29 NOTE — Progress Notes (Signed)
Office Visit Note  Patient: Victor Ramirez             Date of Birth: 09/21/1955           MRN: [id number]             PCP: Wilfrid Lund, PA Referring: Wilfrid Lund, [place] Visit Date: 01/29/2021   Subjective:   History of Present Illness: Victor Ramirez is a 65 y.o. male here for follow up for PMR on prednisone 7.5 mg daily. Since our last visit he was at the hospital last month with abdominal pain workup thought to represent pancreatitis. Symptoms are overall doing better in most areas except right hand, left shoulder, and neck. Left shoulder steroid injection last visit helped for about a month but then had worsening again. His right hand is hurting more in the wrist and hand going numb intermittently. Especially notices this during prolonged driving, and improves keeping wrist in neutral position.  Previous HPI 11/28/20 Victor Ramirez is a 65 y.o. male here for follow up of PMR on prednisone 10 mg PO daily. He stopped methotrexate after our last visit due to abnormal LFT elevation that resolved after stopping. He feels symptoms are better and worse one day vs the next but overall slowly improving. He still has neck pain with rotation shooting down the right side rarely. His left shoulder is hurting more than before especially with reaching overhead, lying on his left side, reaching up to steering wheel.    Previous HPI: 09/30/20 Victor Ramirez is a 65 y.o. male here for follow up for PMR after starting methotrexate 15 mg PO weekly and continuing prednisone with taper from 20 mg down to 10 mg over the past month. He is down to 10 mg since last Thursday and did feel increased pain and stiffness Friday and over the weekend, but symptoms are not too bad today. He rates good days as about a 2 but alternates with days he is stiff all over but especially in the back around base of the neck and in both shoulders. He did experienced some wrist stiffness down the right arm which is new. He has  increased bruising over both forearms which is new.    08/08/20 Victor Ramirez is a 65 y.o. male referred for PMR. His symptoms started abruptly with severe pain in bilateral shoulders limiting use and mobility especially first thing in the morning.  He does not require any preceding injury or changes in health or medications. For evaluation and primary care clinic with x-ray imaging obtained that did not show any structural cause of symptoms he had markedly high inflammatory markers and was started on oral prednisone for suspected PMR.  He noticed dramatic improvement in symptoms within 2 days of starting the medicine.  At follow-up he was recommended to decrease the dose down to 15 mg of prednisone at which time he noticed a return of his symptoms.  He increased back to 20 mg and feels this is doing well again.  The most affected areas were in the base of his neck and bilateral upper back and shoulders with radiation down the upper portion of the arms.  He also felt like it was affecting his right hip and thigh much worse than left, where he has chronic deficits due to history of polio.  He has started to notice some weight gain with the oral prednisone otherwise no specific side effect problems.   Review of Systems  Constitutional:  Negative for fatigue.  HENT:  Negative for mouth dryness.   Eyes:  Negative for dryness.  Respiratory:  Negative for shortness of breath.   Cardiovascular:  Negative for swelling in legs/feet.  Gastrointestinal:  Negative for constipation.  Endocrine: Negative for excessive thirst.  Genitourinary:  Negative for difficulty urinating.  Musculoskeletal:  Positive for joint pain, joint pain, joint swelling, morning stiffness and muscle tenderness.  Skin:  Negative for rash.  Allergic/Immunologic: Negative for susceptible to infections.  Neurological:  Positive for numbness and weakness.  Hematological:  Positive for bruising/bleeding tendency.  Psychiatric/Behavioral:   Positive for sleep disturbance.    PMFS History:  Patient Active Problem List   Diagnosis Date Noted   RUQ abdominal pain    Acute pancreatitis without infection or necrosis 12/02/2020   Pain in left shoulder 11/28/2020   Rheumatoid factor positive 08/29/2020   Polymyalgia rheumatica (HCC) 08/08/2020   Polyarthralgia 08/08/2020   High risk medication use 08/08/2020   Lumbosacral spondylosis with radiculopathy 11/27/2016   Atrophy of muscle of right lower leg 02/04/2014   History of post-polio syndrome 02/02/2014   Pain of left lower extremity 02/02/2014   Chronic back pain 05/14/2013   HTN (hypertension) 05/14/2013   Headache 12/22/2011   Sleep apnea 11/17/2011   Elevation of level of transaminase or lactic acid dehydrogenase (LDH) 09/18/2010   Anxiety 09/08/2010   Tachycardia 09/08/2010   Hyperlipemia 03/01/2006   TIA (transient ischemic attack) 03/01/2006   Tobacco use disorder 03/01/2006   Depressive disorder 10/16/2005    Past Medical History:  Diagnosis Date   Hemochromatosis    Hypertension     Family History  Problem Relation Age of Onset   Arthritis Mother    Thyroid cancer Mother    Melanoma Father    Fibromyalgia Sister    Cancer Brother    Prostate cancer Brother    Williams syndrome Son    Past Surgical History:  Procedure Laterality Date   ANTERIOR LATERAL LUMBAR FUSION WITH PERCUTANEOUS SCREW 1 LEVEL Right 11/27/2016   Procedure: Lumbar Three-Four Transpsoas Lumbar InterbodyFfusion;  Surgeon: Ditty, Loura Halt, MD;  Location: [facility] OR;  Service: Neurosurgery;  Laterality: Right;  L3-4 Transpsoas lumbar interbody fusion/L3-4 Pedicle screw fixation with posterolateral arthrodesis/minimally invasive decompression/Mazor   APPLICATION OF ROBOTIC ASSISTANCE FOR SPINAL PROCEDURE N/A 11/27/2016   Procedure: Lumbar Three-Four Pedicle Screw Fixation with Posterolateral Arthrodesis with Minimally Invasive Decompression with Application of Robotic Assistance;   Surgeon: Ditty, Loura Halt, MD;  Location: [facility] OR;  Service: Neurosurgery;  Laterality: N/A;   BACK SURGERY     x3    BACK SURGERY     battery surgery    spinal manipulation under anesthesia     Social History   Social History Narrative   Not on file   Immunization History  Administered Date(s) Administered   PFIZER(Purple Top)SARS-COV-2 Vaccination 06/01/2019, 07/01/2019     Objective: Vital Signs: BP 130/86 (BP Location: Left Arm, Patient Position: Sitting, Cuff Size: Large)   Pulse 97   Resp 12   Ht 5\' 8"  (1.727 m)   Wt 190 lb (86.2 kg)   BMI 28.89 kg/m    Physical Exam Cardiovascular:     Rate and Rhythm: Normal rate and regular rhythm.  Pulmonary:     Effort: Pulmonary effort is normal.     Breath sounds: Normal breath sounds.  Musculoskeletal:     Right lower leg: No edema.     Left lower leg: No edema.  Skin:  General: Skin is warm and dry.     Findings: No rash.  Neurological:     Mental Status: He is alert.  Psychiatric:        Mood and Affect: Mood normal.     Musculoskeletal Exam:  Neck pain posteriorly with flexion and lateral rotation Left shoulder pain with abduction painful arc with lowering, strength grossly intact Elbows full ROM no tenderness or swelling Right wrist pain with extension, tender at base of thumb, no palpable swelling Fingers full ROM no tenderness or swelling Knees full ROM no tenderness or swelling   Investigation: No additional findings.  Imaging: No results found.  Recent Labs: Lab Results  Component Value Date   WBC 14.9 (H) 12/03/2020   HGB 13.9 12/03/2020   PLT 256 12/03/2020   NA 134 (L) 12/03/2020   K 4.0 12/03/2020   CL 100 12/03/2020   CO2 22 12/03/2020   GLUCOSE 77 12/03/2020   BUN 11 12/03/2020   CREATININE 0.81 12/03/2020   BILITOT 1.0 12/03/2020   ALKPHOS 71 12/03/2020   AST 33 12/03/2020   ALT 46 (H) 12/03/2020   PROT 7.0 12/03/2020   ALBUMIN 3.6 12/03/2020   CALCIUM 8.9 12/03/2020    GFRAA 111 08/08/2020    Speciality Comments: No specialty comments available.  Procedures:  No procedures performed Allergies: Bee venom   Assessment / Plan:     Visit Diagnoses: Polymyalgia rheumatica (HCC) - Plan: predniSONE (DELTASONE) 5 MG tablet  PMR symptoms appear to be much improved at this time and on prednisone 7.5 mg daily. I recommend decrease to 5 mg daily next step. I will recommend very slow discontinuation from here due to increased symptoms with past tapering.  Chronic left shoulder pain - Plan: Ambulatory referral to Physical Therapy  Left shoulder pain appears most consistent with rotator cuff arthropathy. Not a long lasting benefit with steroid injections. He may need procedural intervention in future for now referral to PT for work on this if no benefit within 2 months would recommend MRI for workup.   Orders: Orders Placed This Encounter  Procedures   Ambulatory referral to Physical Therapy    Meds ordered this encounter  Medications   predniSONE (DELTASONE) 5 MG tablet    Sig: Take 1 tablet (5 mg total) by mouth daily with breakfast.    Dispense:  30 tablet    Refill:  2      Follow-Up Instructions: Return in about 2 months (around 03/31/2021) for PMR/left shoulder f/u 43mos.   Fuller Plan, MD  Note - This record has been created using [employer].  Chart creation errors have been sought, but may not always  have been located. Such creation errors do not reflect on  the standard of medical care.

## 2021-02-20 DIAGNOSIS — F4321 Adjustment disorder with depressed mood: Secondary | ICD-10-CM | POA: Diagnosis not present

## 2021-02-20 DIAGNOSIS — G8929 Other chronic pain: Secondary | ICD-10-CM | POA: Diagnosis not present

## 2021-02-20 DIAGNOSIS — I10 Essential (primary) hypertension: Secondary | ICD-10-CM | POA: Diagnosis not present

## 2021-02-20 DIAGNOSIS — F321 Major depressive disorder, single episode, moderate: Secondary | ICD-10-CM | POA: Diagnosis not present

## 2021-03-04 NOTE — Therapy (Signed)
OUTPATIENT PHYSICAL THERAPY SHOULDER EVALUATION   Patient Name: TASHI BAND MRN: [id number] DOB:02/08/1956, 66 y.o., male Today's Date: 03/06/2021   PT End of Session - 03/06/21 2132     Visit Number 1    Number of Visits 13    Date for PT Re-Evaluation 04/25/21    Authorization Type [employer] MEDICARE    Authorization Time Period reassess FOTO on 5th and 10th visits    Progress Note Due on Visit 10    PT Start Time 1022    PT Stop Time 1105    PT Time Calculation (min) 43 min    Activity Tolerance Patient limited by pain    Behavior During Therapy WFL for tasks assessed/performed              Past Medical History:  Diagnosis Date   Hemochromatosis    Hypertension    Past Surgical History:  Procedure Laterality Date   ANTERIOR LATERAL LUMBAR FUSION WITH PERCUTANEOUS SCREW 1 LEVEL Right 11/27/2016   Procedure: Lumbar Three-Four Transpsoas Lumbar InterbodyFfusion;  Surgeon: Ditty, Loura Halt, MD;  Location: [facility] OR;  Service: Neurosurgery;  Laterality: Right;  L3-4 Transpsoas lumbar interbody fusion/L3-4 Pedicle screw fixation with posterolateral arthrodesis/minimally invasive decompression/Mazor   APPLICATION OF ROBOTIC ASSISTANCE FOR SPINAL PROCEDURE N/A 11/27/2016   Procedure: Lumbar Three-Four Pedicle Screw Fixation with Posterolateral Arthrodesis with Minimally Invasive Decompression with Application of Robotic Assistance;  Surgeon: Ditty, Loura Halt, MD;  Location: [facility] OR;  Service: Neurosurgery;  Laterality: N/A;   BACK SURGERY     x3    BACK SURGERY     battery surgery    spinal manipulation under anesthesia     Patient Active Problem List   Diagnosis Date Noted   RUQ abdominal pain    Acute pancreatitis without infection or necrosis 12/02/2020   Pain in left shoulder 11/28/2020   Rheumatoid factor positive 08/29/2020   Polymyalgia rheumatica (HCC) 08/08/2020   Polyarthralgia 08/08/2020   High risk medication use 08/08/2020   Lumbosacral spondylosis with  radiculopathy 11/27/2016   Atrophy of muscle of right lower leg 02/04/2014   History of post-polio syndrome 02/02/2014   Pain of left lower extremity 02/02/2014   Chronic back pain 05/14/2013   HTN (hypertension) 05/14/2013   Headache 12/22/2011   Sleep apnea 11/17/2011   Elevation of level of transaminase or lactic acid dehydrogenase (LDH) 09/18/2010   Anxiety 09/08/2010   Tachycardia 09/08/2010   Hyperlipemia 03/01/2006   TIA (transient ischemic attack) 03/01/2006   Tobacco use disorder 03/01/2006   Depressive disorder 10/16/2005    PCP: Wilfrid Lund, PA  REFERRING PROVIDER: Fuller Plan, MD  REFERRING DIAG: Chronic left shoulder pain  THERAPY DIAG:  Chronic left shoulder pain  Muscle weakness (generalized)  Decreased ROM of left shoulder   ONSET DATE: 2 years ago  SUBJECTIVE:  SUBJECTIVE STATEMENT: Pt reports a 2 year chronic Hx of L shoulder which started while sleeping lying of his L side. Pt notes he started sleeping in a recliner which helped decrease the pain a little. About 6 months ago he felt a pop in the shoulder when picking up sticks in the yard and the pain has been consistently wrose since then. A cortisone shot by Dr. Dimple Casey also helped some. 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     PERTINENT HISTORY: PMR good days/bad days- recent Dx; HTN, hemochromatosis, 4 back surgeries  PAIN:  Are you having pain? Yes VAS scale: 4/10, 0-10/10, average pain 3-4/10 Pain location: Shoulder Pain orientation: Left  PAIN TYPE: sharp Pain description: intermittent  Aggravating factors: Moving it, the pain is wrose when his PMR pain is wrose Relieving factors: Rest  PRECAUTIONS: None  WEIGHT BEARING RESTRICTIONS No  FALLS:  Has patient fallen in last 6 months? Yes Number  of falls: 1, tripped over a branch when hunting. Pt has no balance concerns.  LIVING ENVIRONMENT: Lives with: lives with their family Lives in: House/apartment Stairs: Yes; Internal: 14 steps; Rail on B going up and External: 4 steps; Rail on R going up Has following equipment at home: None  OCCUPATION: Disability  PLOF: Independent  PATIENT GOALS: Pain relief  OBJECTIVE:   DIAGNOSTIC FINDINGS:  NA  PATIENT SURVEYS:  FOTO 41% functional status; 65% expected level  COGNITION:  Overall cognitive status: Within functional limits for tasks assessed     SENSATION:  Light touch: Appears intact, intermittent B arms  POSTURE: Forward head, rounded shoulders, increased thoracic kyphosis, winging scapula  PALPATION: TTP of the post/lat GH area and post. Post. scapula in area of infraspinatus  UPPER EXTREMITY AROM/PROM:  A/PROM Right 03/06/2021 Left 03/06/2021  Shoulder flexion 140 125  Shoulder extension    Shoulder abduction    Shoulder adduction    Shoulder internal rotation T11 L5  Shoulder external rotation T4 C7  Elbow flexion    Elbow extension    Wrist flexion    Wrist extension    Wrist ulnar deviation    Wrist radial deviation    Wrist pronation    Wrist supination    (Blank rows = not tested) AROM: L shoulder pain was provoked c flexion with knees buckling due to the pain PROM: L shoulder PROM was greater than AROM and similar with R shoulder PROM. Crepitus was not noted with PROM. Pain was provoked at the endrange of flexion and abduction. UPPER EXTREMITY MMT:  MMT Right 03/06/2021 Left 03/06/2021  Shoulder flexion  4+  Shoulder extension  4+  Shoulder abduction  4+  Shoulder adduction  5  Shoulder ER  4  Shoulder IR  4  Elbow flexion    Elbow extension    Wrist flexion    Wrist extension    Wrist ulnar deviation    Wrist radial deviation    Wrist pronation    Wrist supination    Grip strength (lbs)    (Blank rows = not tested) Pain was provoked  with L shoulder ER and IR and pt reporting popping deep in his L GH jt. At times the pr's knees buckled due to the pain.  SHOULDER SPECIAL TESTS: L shoulder  Impingement tests: Hawkins/Kennedy impingement test: positive  and Painful arc test: positive   Instability tests: Posterior drawer test: negative  Rotator cuff assessment: Empty can test: negative and Full can test: negative; report  of pain without overt weakness  Biceps assessment: Yergason's test: negative and Speed's test: negative   TODAY'S TREATMENT:  Recommendations for sleeping positions and support    PATIENT EDUCATION: Education details: Eval findings, HEP, POC, education re: support for the L shoulder for greater comfort with sleep Person educated: Patient Education method: Explanation, Demonstration, Tactile cues, and Verbal cues Education comprehension: verbalized understanding, returned demonstration, verbal cues required, and tactile cues required   HOME EXERCISE PROGRAM: N/A  ASSESSMENT:  CLINICAL IMPRESSION: Patient is a 66 y.o. M who was seen today for physical therapy evaluation and treatment for Chronic left shoulder pain. Objective impairments include decreased ROM, decreased strength, impaired UE functional use, and pain. Pt presents with minimally decreased L shoulder AROM and strength with function limited by report of pain with active and resisted movements. These impairments are limiting patient from cleaning, laundry, yard work, and shopping. Personal factors including PMR good days/bad days- recent Dx; HTN, hemochromatosis, 4 back surgeries are also affecting patient's functional outcome. Patient will benefit from skilled PT to address above impairments and improve overall function.  REHAB POTENTIAL: Fair due to chronicity   CLINICAL DECISION MAKING: Stable/uncomplicated  EVALUATION COMPLEXITY: Low   GOALS:  SHORT TERM GOALS:  STG Name Target Date Goal status  1 Pt will ne Ind in an initial  HEP Baseline:  03/26/21 INITIAL  2 Pt will voice understanding of measures to decrease and manage L shoulder pain  Baseline:  03/26/21 INITIAL   LONG TERM GOALS:   LTG Name Target Date Goal status  1 Increase L shoulder strength by 1/2 muscle grade for all movements to improve functional use of the L UE Baseline: 04/25/21 INITIAL  2 Pt will report a decrease in L shoulder pain with daily activities to 4/10 or less for improve function and QOL. Baseline:0-10/10 04/25/21 INITIAL  3 Pt's FOTO score will increase to the the predicted value of 65% as a measure of decreased pain and improved function. Baseline: 41% 04/25/21 INITIAL  4 Pt will be Ind in a final HEP to maintain the achieved LOF Baseline: 04/25/21 INITIAL   PLAN: PT FREQUENCY: 2x/week  PT DURATION: 6 weeks  PLANNED INTERVENTIONS: Therapeutic exercises, Therapeutic activity, Patient/Family education, Joint mobilization, Dry Needling, Electrical stimulation, Cryotherapy, Moist heat, Taping, Vasopneumatic device, Ultrasound, Ionotophoresis 4mg /ml Dexamethasone, and Manual therapy  PLAN FOR NEXT SESSION: Initiate Therex and develop an initial HEP. Review HEP   Joellyn Ruedllen Jai Steil MS, PT 03/06/21 9:32 PM

## 2021-03-05 ENCOUNTER — Ambulatory Visit: Payer: Medicare Other | Attending: Internal Medicine

## 2021-03-05 ENCOUNTER — Other Ambulatory Visit: Payer: Self-pay

## 2021-03-05 DIAGNOSIS — M6281 Muscle weakness (generalized): Secondary | ICD-10-CM | POA: Diagnosis not present

## 2021-03-05 DIAGNOSIS — M25512 Pain in left shoulder: Secondary | ICD-10-CM | POA: Diagnosis not present

## 2021-03-05 DIAGNOSIS — G8929 Other chronic pain: Secondary | ICD-10-CM | POA: Diagnosis not present

## 2021-03-05 DIAGNOSIS — M25612 Stiffness of left shoulder, not elsewhere classified: Secondary | ICD-10-CM | POA: Diagnosis not present

## 2021-03-08 NOTE — Therapy (Incomplete)
OUTPATIENT PHYSICAL THERAPY TREATMENT NOTE   Patient Name: Victor Ramirez MRN: [id number] DOB:[birth year], 66 y.o., male Today's Date: 03/08/2021  PCP: Wilfrid Lund, [place] REFERRING PROVIDER: Wilfrid Lund, [place]    Past Medical History:  Diagnosis Date   Hemochromatosis    Hypertension    Past Surgical History:  Procedure Laterality Date   ANTERIOR LATERAL LUMBAR FUSION WITH PERCUTANEOUS SCREW 1 LEVEL Right 11/27/2016   Procedure: Lumbar Three-Four Transpsoas Lumbar InterbodyFfusion;  Surgeon: Ditty, Loura Halt, MD;  Location: [facility] OR;  Service: Neurosurgery;  Laterality: Right;  L3-4 Transpsoas lumbar interbody fusion/L3-4 Pedicle screw fixation with posterolateral arthrodesis/minimally invasive decompression/Mazor   APPLICATION OF ROBOTIC ASSISTANCE FOR SPINAL PROCEDURE N/A 11/27/2016   Procedure: Lumbar Three-Four Pedicle Screw Fixation with Posterolateral Arthrodesis with Minimally Invasive Decompression with Application of Robotic Assistance;  Surgeon: Ditty, Loura Halt, MD;  Location: [facility] OR;  Service: Neurosurgery;  Laterality: N/A;   BACK SURGERY     x3    BACK SURGERY     battery surgery    spinal manipulation under anesthesia     Patient Active Problem List   Diagnosis Date Noted   RUQ abdominal pain    Acute pancreatitis without infection or necrosis 12/02/2020   Pain in left shoulder 11/28/2020   Rheumatoid factor positive 08/29/2020   Polymyalgia rheumatica (HCC) 08/08/2020   Polyarthralgia 08/08/2020   High risk medication use 08/08/2020   Lumbosacral spondylosis with radiculopathy 11/27/2016   Atrophy of muscle of right lower leg 02/04/2014   History of post-polio syndrome 02/02/2014   Pain of left lower extremity 02/02/2014   Chronic back pain 05/14/2013   HTN (hypertension) 05/14/2013   Headache 12/22/2011   Sleep apnea 11/17/2011   Elevation of level of transaminase or lactic acid dehydrogenase (LDH) 09/18/2010   Anxiety 09/08/2010   Tachycardia  09/08/2010   Hyperlipemia 03/01/2006   TIA (transient ischemic attack) 03/01/2006   Tobacco use disorder 03/01/2006   Depressive disorder 10/16/2005    REFERRING DIAG: Chronic left shoulder pain  THERAPY DIAG:  No diagnosis found.  PERTINENT HISTORY: PMR good days/bad days- recent Dx; HTN, hemochromatosis, 4 back surgeries  PRECAUTIONS: None  SUBJECTIVE: ***  PAIN:  Are you having pain? {yes/no:20286} VAS scale: ***/10 Pain location: *** Pain orientation: {Pain Orientation:25161}  PAIN TYPE: {type:313116} Pain description: {PAIN DESCRIPTION:21022940}  Aggravating factors: *** Relieving factors: ***     OBJECTIVE:   *Unless otherwise noted, objective information gathered previously* DIAGNOSTIC FINDINGS:  NA   PATIENT SURVEYS:  FOTO 41% functional status; 65% expected level   COGNITION:          Overall cognitive status: Within functional limits for tasks assessed                               SENSATION:          Light touch: Appears intact, intermittent B arms   POSTURE: Forward head, rounded shoulders, increased thoracic kyphosis, winging scapula   PALPATION: TTP of the post/lat GH area and post. Post. scapula in area of infraspinatus   UPPER EXTREMITY AROM/PROM:   A/PROM Right 03/06/2021 Left 03/06/2021  Shoulder flexion 140 125  Shoulder extension      Shoulder abduction      Shoulder adduction      Shoulder internal rotation T11 L5  Shoulder external rotation T4 C7  Elbow flexion      Elbow extension      Wrist  flexion      Wrist extension      Wrist ulnar deviation      Wrist radial deviation      Wrist pronation      Wrist supination      (Blank rows = not tested) AROM: L shoulder pain was provoked c flexion with knees buckling due to the pain PROM: L shoulder PROM was greater than AROM and similar with R shoulder PROM. Crepitus was not noted with PROM. Pain was provoked at the endrange of flexion and abduction. UPPER EXTREMITY MMT:   MMT  Right 03/06/2021 Left 03/06/2021  Shoulder flexion   4+  Shoulder extension   4+  Shoulder abduction   4+  Shoulder adduction   5  Shoulder ER   4  Shoulder IR   4  Elbow flexion      Elbow extension      Wrist flexion      Wrist extension      Wrist ulnar deviation      Wrist radial deviation      Wrist pronation      Wrist supination      Grip strength (lbs)      (Blank rows = not tested) Pain was provoked with L shoulder ER and IR and pt reporting popping deep in his L GH jt. At times the pr's knees buckled due to the pain.   SHOULDER SPECIAL TESTS: L shoulder           Impingement tests: Hawkins/Kennedy impingement test: positive  and Painful arc test: positive            Instability tests: Posterior drawer test: negative           Rotator cuff assessment: Empty can test: negative and Full can test: negative; report of pain without overt weakness           Biceps assessment: Yergason's test: negative and Speed's test: negative            [facility] Adult PT Treatment:                                                DATE: 03/11/2021 Therapeutic Exercise: *** Manual Therapy: *** Neuromuscular re-ed: *** Therapeutic Activity: *** Modalities: *** Self Care: ***   EVAL TREATMENT:  Recommendations for sleeping positions and support      PATIENT EDUCATION: Education details: Eval findings, HEP, POC, education re: support for the L shoulder for greater comfort with sleep Person educated: Patient Education method: Explanation, Demonstration, Tactile cues, and Verbal cues Education comprehension: verbalized understanding, returned demonstration, verbal cues required, and tactile cues required     HOME EXERCISE PROGRAM: N/A   ASSESSMENT:   CLINICAL IMPRESSION: ***   REHAB POTENTIAL: Fair due to chronicity    CLINICAL DECISION MAKING: Stable/uncomplicated   EVALUATION COMPLEXITY: Low     GOALS:   SHORT TERM GOALS:   STG Name Target Date Goal status  1 Pt will ne  Ind in an initial HEP Baseline:  03/26/21 INITIAL  2 Pt will voice understanding of measures to decrease and manage L shoulder pain  Baseline:  03/26/21 INITIAL    LONG TERM GOALS:    LTG Name Target Date Goal status  1 Increase L shoulder strength by 1/2 muscle grade for all movements to improve functional use of the L  UE Baseline: 04/25/21 INITIAL  2 Pt will report a decrease in L shoulder pain with daily activities to 4/10 or less for improve function and QOL. Baseline:0-10/10 04/25/21 INITIAL  3 Pt's FOTO score will increase to the the predicted value of 65% as a measure of decreased pain and improved function. Baseline: 41% 04/25/21 INITIAL  4 Pt will be Ind in a final HEP to maintain the achieved LOF Baseline: 04/25/21 INITIAL    PLAN: PT FREQUENCY: 2x/week   PT DURATION: 6 weeks   PLANNED INTERVENTIONS: Therapeutic exercises, Therapeutic activity, Patient/Family education, Joint mobilization, Dry Needling, Electrical stimulation, Cryotherapy, Moist heat, Taping, Vasopneumatic device, Ultrasound, Ionotophoresis 4mg /ml Dexamethasone, and Manual therapy   PLAN FOR NEXT SESSION: Initiate Therex and develop an initial HEP. Review HEP    Carmelina DaneYarborough, Jonthan Leite, PT, DPT 03/08/21 8:09 AM

## 2021-03-11 ENCOUNTER — Ambulatory Visit: Payer: Medicare Other

## 2021-03-12 NOTE — Therapy (Incomplete)
OUTPATIENT PHYSICAL THERAPY TREATMENT NOTE   Patient Name: Victor Ramirez MRN: [id number] DOB:1955-10-06, 66 y.o., male Today's Date: 03/12/2021  PCP: Wilfrid Lund, [place] REFERRING PROVIDER: Fuller Plan, MD    Past Medical History:  Diagnosis Date   Hemochromatosis    Hypertension    Past Surgical History:  Procedure Laterality Date   ANTERIOR LATERAL LUMBAR FUSION WITH PERCUTANEOUS SCREW 1 LEVEL Right 11/27/2016   Procedure: Lumbar Three-Four Transpsoas Lumbar InterbodyFfusion;  Surgeon: Ditty, Loura Halt, MD;  Location: [facility] OR;  Service: Neurosurgery;  Laterality: Right;  L3-4 Transpsoas lumbar interbody fusion/L3-4 Pedicle screw fixation with posterolateral arthrodesis/minimally invasive decompression/Mazor   APPLICATION OF ROBOTIC ASSISTANCE FOR SPINAL PROCEDURE N/A 11/27/2016   Procedure: Lumbar Three-Four Pedicle Screw Fixation with Posterolateral Arthrodesis with Minimally Invasive Decompression with Application of Robotic Assistance;  Surgeon: Ditty, Loura Halt, MD;  Location: [facility] OR;  Service: Neurosurgery;  Laterality: N/A;   BACK SURGERY     x3    BACK SURGERY     battery surgery    spinal manipulation under anesthesia     Patient Active Problem List   Diagnosis Date Noted   RUQ abdominal pain    Acute pancreatitis without infection or necrosis 12/02/2020   Pain in left shoulder 11/28/2020   Rheumatoid factor positive 08/29/2020   Polymyalgia rheumatica (HCC) 08/08/2020   Polyarthralgia 08/08/2020   High risk medication use 08/08/2020   Lumbosacral spondylosis with radiculopathy 11/27/2016   Atrophy of muscle of right lower leg 02/04/2014   History of post-polio syndrome 02/02/2014   Pain of left lower extremity 02/02/2014   Chronic back pain 05/14/2013   HTN (hypertension) 05/14/2013   Headache 12/22/2011   Sleep apnea 11/17/2011   Elevation of level of transaminase or lactic acid dehydrogenase (LDH) 09/18/2010   Anxiety 09/08/2010    Tachycardia 09/08/2010   Hyperlipemia 03/01/2006   TIA (transient ischemic attack) 03/01/2006   Tobacco use disorder 03/01/2006   Depressive disorder 10/16/2005    REFERRING DIAG: Chronic left shoulder pain  THERAPY DIAG:  No diagnosis found.  PERTINENT HISTORY: PMR good days/bad days- recent Dx; HTN, hemochromatosis, 4 back surgeries  PRECAUTIONS: None  SUBJECTIVE: ***  PAIN:  Are you having pain? {yes/no:20286} NPRS scale: ***/10 Pain location: *** Pain orientation: {Pain Orientation:25161}  PAIN TYPE: {type:313116} Pain description: {PAIN DESCRIPTION:21022940}  Aggravating factors: *** Relieving factors: ***     OBJECTIVE:   *Unless otherwise noted, objective data collected previously*   DIAGNOSTIC FINDINGS:  NA   PATIENT SURVEYS:  FOTO 41% functional status; 65% expected level   COGNITION:          Overall cognitive status: Within functional limits for tasks assessed                               SENSATION:          Light touch: Appears intact, intermittent B arms   POSTURE: Forward head, rounded shoulders, increased thoracic kyphosis, winging scapula   PALPATION: TTP of the post/lat GH area and post. Post. scapula in area of infraspinatus   UPPER EXTREMITY AROM/PROM:   A/PROM Right 03/06/2021 Left 03/06/2021  Shoulder flexion 140 125  Shoulder extension      Shoulder abduction      Shoulder adduction      Shoulder internal rotation T11 L5  Shoulder external rotation T4 C7  Elbow flexion      Elbow extension  Wrist flexion      Wrist extension      Wrist ulnar deviation      Wrist radial deviation      Wrist pronation      Wrist supination      (Blank rows = not tested) AROM: L shoulder pain was provoked c flexion with knees buckling due to the pain PROM: L shoulder PROM was greater than AROM and similar with R shoulder PROM. Crepitus was not noted with PROM. Pain was provoked at the endrange of flexion and abduction. UPPER EXTREMITY  MMT:   MMT Right 03/06/2021 Left 03/06/2021  Shoulder flexion   4+  Shoulder extension   4+  Shoulder abduction   4+  Shoulder adduction   5  Shoulder ER   4  Shoulder IR   4  Elbow flexion      Elbow extension      Wrist flexion      Wrist extension      Wrist ulnar deviation      Wrist radial deviation      Wrist pronation      Wrist supination      Grip strength (lbs)      (Blank rows = not tested) Pain was provoked with L shoulder ER and IR and pt reporting popping deep in his L GH jt. At times the pr's knees buckled due to the pain.   SHOULDER SPECIAL TESTS: L shoulder           Impingement tests: Hawkins/Kennedy impingement test: positive  and Painful arc test: positive            Instability tests: Posterior drawer test: negative           Rotator cuff assessment: Empty can test: negative and Full can test: negative; report of pain without overt weakness           Biceps assessment: Yergason's test: negative and Speed's test: negative            TODAY'S TREATMENT:   [facility] Adult PT Treatment:                                                DATE: 03/13/2021 Therapeutic Exercise: *** Manual Therapy: *** Neuromuscular re-ed: *** Therapeutic Activity: *** Modalities: *** Self Care: ***   EVAL: Recommendations for sleeping positions and support      PATIENT EDUCATION: Education details: Updated HEP Person educated: Patient Education method: Explanation, Demonstration, Tactile cues, and Verbal cues Education comprehension: verbalized understanding, returned demonstration, verbal cues required, and tactile cues required     HOME EXERCISE PROGRAM: ***   ASSESSMENT:   CLINICAL IMPRESSION: ***   REHAB POTENTIAL: Fair due to chronicity    CLINICAL DECISION MAKING: Stable/uncomplicated   EVALUATION COMPLEXITY: Low     GOALS:   SHORT TERM GOALS:   STG Name Target Date Goal status  1 Pt will ne Ind in an initial HEP Baseline:  03/26/21 INITIAL  2 Pt will  voice understanding of measures to decrease and manage L shoulder pain  Baseline:  03/26/21 INITIAL    LONG TERM GOALS:    LTG Name Target Date Goal status  1 Increase L shoulder strength by 1/2 muscle grade for all movements to improve functional use of the L UE Baseline: 04/25/21 INITIAL  2 Pt will report a decrease  in L shoulder pain with daily activities to 4/10 or less for improve function and QOL. Baseline:0-10/10 04/25/21 INITIAL  3 Pt's FOTO score will increase to the the predicted value of 65% as a measure of decreased pain and improved function. Baseline: 41% 04/25/21 INITIAL  4 Pt will be Ind in a final HEP to maintain the achieved LOF Baseline: 04/25/21 INITIAL    PLAN: PT FREQUENCY: 2x/week   PT DURATION: 6 weeks   PLANNED INTERVENTIONS: Therapeutic exercises, Therapeutic activity, Patient/Family education, Joint mobilization, Dry Needling, Electrical stimulation, Cryotherapy, Moist heat, Taping, Vasopneumatic device, Ultrasound, Ionotophoresis 4mg /ml Dexamethasone, and Manual therapy   PLAN FOR NEXT SESSION: Initiate Therex and develop an initial HEP. Review HEP    , PT, DPT 03/12/21 9:09 AM

## 2021-03-13 ENCOUNTER — Ambulatory Visit: Payer: Medicare Other

## 2021-03-18 ENCOUNTER — Ambulatory Visit: Payer: Medicare Other

## 2021-03-20 ENCOUNTER — Ambulatory Visit: Payer: Medicare Other

## 2021-03-20 ENCOUNTER — Other Ambulatory Visit: Payer: Self-pay

## 2021-03-20 DIAGNOSIS — M25612 Stiffness of left shoulder, not elsewhere classified: Secondary | ICD-10-CM | POA: Diagnosis not present

## 2021-03-20 DIAGNOSIS — M25512 Pain in left shoulder: Secondary | ICD-10-CM | POA: Diagnosis not present

## 2021-03-20 DIAGNOSIS — G8929 Other chronic pain: Secondary | ICD-10-CM

## 2021-03-20 DIAGNOSIS — M6281 Muscle weakness (generalized): Secondary | ICD-10-CM

## 2021-03-20 NOTE — Therapy (Signed)
OUTPATIENT PHYSICAL THERAPY TREATMENT NOTE   Patient Name: Mariane MastersDavid C Batie MRN: [id number] DOB:[birth year], 66 y.o., male Today's Date: 03/20/2021  PCP: Wilfrid LundBecker, Anna G, [place] REFERRING PROVIDER: Fuller Planice, Christopher W, MD   PT End of Session - 03/20/21 1151     Visit Number 2    Number of Visits 13    Date for PT Re-Evaluation 04/25/21    Authorization Type [employer] MEDICARE    Authorization Time Period reassess FOTO on 5th and 10th visits    Progress Note Due on Visit 10    PT Start Time 1149    PT Stop Time 1234    PT Time Calculation (min) 45 min    Activity Tolerance Patient limited by pain    Behavior During Therapy [facility] for tasks assessed/performed             Past Medical History:  Diagnosis Date   Hemochromatosis    Hypertension    Past Surgical History:  Procedure Laterality Date   ANTERIOR LATERAL LUMBAR FUSION WITH PERCUTANEOUS SCREW 1 LEVEL Right 11/27/2016   Procedure: Lumbar Three-Four Transpsoas Lumbar InterbodyFfusion;  Surgeon: Ditty, Loura HaltBenjamin Jared, MD;  [facility];  Service: Neurosurgery;  Laterality: Right;  L3-4 Transpsoas lumbar interbody fusion/L3-4 Pedicle screw fixation with posterolateral arthrodesis/minimally invasive decompression/Mazor   APPLICATION OF ROBOTIC ASSISTANCE FOR SPINAL PROCEDURE N/A 11/27/2016   Procedure: Lumbar Three-Four Pedicle Screw Fixation with Posterolateral Arthrodesis with Minimally Invasive Decompression with Application of Robotic Assistance;  Surgeon: Ditty, Loura HaltBenjamin Jared, MD;  [facility];  Service: Neurosurgery;  Laterality: N/A;   BACK SURGERY     x3    BACK SURGERY     battery surgery    spinal manipulation under anesthesia     Patient Active Problem List   Diagnosis Date Noted   RUQ abdominal pain    Acute pancreatitis without infection or necrosis 12/02/2020   Pain in left shoulder 11/28/2020   Rheumatoid factor positive 08/29/2020   Polymyalgia rheumatica (HCC) 08/08/2020   Polyarthralgia 08/08/2020    High risk medication use 08/08/2020   Lumbosacral spondylosis with radiculopathy 11/27/2016   Atrophy of muscle of right lower leg 02/04/2014   History of post-polio syndrome 02/02/2014   Pain of left lower extremity 02/02/2014   Chronic back pain 05/14/2013   HTN (hypertension) 05/14/2013   Headache 12/22/2011   Sleep apnea 11/17/2011   Elevation of level of transaminase or lactic acid dehydrogenase (LDH) 09/18/2010   Anxiety 09/08/2010   Tachycardia 09/08/2010   Hyperlipemia 03/01/2006   TIA (transient ischemic attack) 03/01/2006   Tobacco use disorder 03/01/2006   Depressive disorder 10/16/2005    REFERRING DIAG: Chronic left shoulder pain  THERAPY DIAG:  Chronic left shoulder pain  Decreased ROM of left shoulder  Muscle weakness (generalized)  PERTINENT HISTORY: PMR good days/bad days- recent Dx; HTN, hemochromatosis, 4 back surgeries  SUBJECTIVE:  SUBJECTIVE STATEMENT:   Pt reports he is being careful with over head arm movements which is lowering his pain. He is supporting his arm better when sleeping which has been helpful.   PERTINENT HISTORY: PMR good days/bad days- recent Dx; HTN, hemochromatosis, 4 back surgeries   PAIN:  Are you having pain? Yes VAS scale: 4/10 with movement, 0/10 at rest Pain location: Shoulder Pain orientation: Left  PAIN TYPE: sharp Pain description: intermittent  Aggravating factors: Moving it, the pain is wrose when his PMR pain is wrose Relieving factors: Rest   PRECAUTIONS: None   WEIGHT BEARING RESTRICTIONS No   OCCUPATION: Disability   PLOF: Independent   PATIENT GOALS: Pain relief   OBJECTIVE:    DIAGNOSTIC FINDINGS:  NA   PATIENT SURVEYS:  FOTO 41% functional status; 65% expected level   COGNITION:          Overall cognitive status: Within  functional limits for tasks assessed                               SENSATION:          Light touch: Appears intact, intermittent B arms   POSTURE: Forward head, rounded shoulders, increased thoracic kyphosis, winging scapula   PALPATION: TTP of the post/lat GH area and post. Post. scapula in area of infraspinatus   UPPER EXTREMITY AROM/PROM:   A/PROM Right 03/06/2021 Left 03/06/2021  Shoulder flexion 140 125  Shoulder extension      Shoulder abduction      Shoulder adduction      Shoulder internal rotation T11 L5  Shoulder external rotation T4 C7  Elbow flexion      Elbow extension      Wrist flexion      Wrist extension      Wrist ulnar deviation      Wrist radial deviation      Wrist pronation      Wrist supination      (Blank rows = not tested) AROM: L shoulder pain was provoked c flexion with knees buckling due to the pain PROM: L shoulder PROM was greater than AROM and similar with R shoulder PROM. Crepitus was not noted with PROM. Pain was provoked at the endrange of flexion and abduction. UPPER EXTREMITY MMT:   MMT Right 03/06/2021 Left 03/06/2021  Shoulder flexion   4+  Shoulder extension   4+  Shoulder abduction   4+  Shoulder adduction   5  Shoulder ER   4  Shoulder IR   4  Elbow flexion      Elbow extension      Wrist flexion      Wrist extension      Wrist ulnar deviation      Wrist radial deviation      Wrist pronation      Wrist supination      Grip strength (lbs)      (Blank rows = not tested) Pain was provoked with L shoulder ER and IR and pt reporting popping deep in his L GH jt. At times the pr's knees buckled due to the pain.   SHOULDER SPECIAL TESTS: L shoulder           Impingement tests: Hawkins/Kennedy impingement test: positive  and Painful arc test: positive            Instability tests: Posterior drawer test: negative  Rotator cuff assessment: Empty can test: negative and Full can test: negative; report of pain without overt  weakness           Biceps assessment: Yergason's test: negative and Speed's test: negative   [facility] Adult PT Treatment:                                                DATE: 03/20/21 Therapeutic Exercise: Table top shoulder flexion, x10, L and R Shoulder row, 2x15, YTB Shoulder ext, 2x15, YTB Shoulder ER, 2x10, YTB, R and L. L stopped after a few reps due to pain Shoulder IR, 2x10, YTB, R and L    - pt needed to take periodic rest breaks from standing due LBP Self Care: Updated HEP             TODAY'S TREATMENT: Eval-03/05/21 Recommendations for sleeping positions and support      PATIENT EDUCATION: Education details: Eval findings, HEP, POC, education re: support for the L shoulder for greater comfort with sleep Person educated: Patient Education method: Explanation, Demonstration, Tactile cues, and Verbal cues Education comprehension: verbalized understanding, returned demonstration, verbal cues required, and tactile cues required     HOME EXERCISE PROGRAM: N/A   ASSESSMENT:   CLINICAL IMPRESSION:  PT was completed for bilat shoulder ROM and strengthening managing the exs to the tolerance of the pt's shoulder pain, L>R. Therex were added to the pt's HEP. Pt returned proper demonstration of the therex. Pt tolerated today's PT session without adverse effects. Pt will benefit from skilled PT to address deficits to optimize function of his shoulders.   REHAB POTENTIAL: Fair due to chronicity    CLINICAL DECISION MAKING: Stable/uncomplicated   EVALUATION COMPLEXITY: Low     GOALS:   SHORT TERM GOALS:   STG Name Target Date Goal status  1 Pt will ne Ind in an initial HEP Baseline:  03/26/21 INITIAL  2 Pt will voice understanding of measures to decrease and manage L shoulder pain  Baseline:  03/26/21 INITIAL    LONG TERM GOALS:    LTG Name Target Date Goal status  1 Increase L shoulder strength by 1/2 muscle grade for all movements to improve functional use of the L  UE Baseline: 04/25/21 INITIAL  2 Pt will report a decrease in L shoulder pain with daily activities to 4/10 or less for improve function and QOL. Baseline:0-10/10 04/25/21 INITIAL  3 Pt's FOTO score will increase to the the predicted value of 65% as a measure of decreased pain and improved function. Baseline: 41% 04/25/21 INITIAL  4 Pt will be Ind in a final HEP to maintain the achieved LOF Baseline: 04/25/21 INITIAL    PLAN: PT FREQUENCY: 2x/week   PT DURATION: 6 weeks   PLANNED INTERVENTIONS: Therapeutic exercises, Therapeutic activity, Patient/Family education, Joint mobilization, Dry Needling, Electrical stimulation, Cryotherapy, Moist heat, Taping, Vasopneumatic device, Ultrasound, Ionotophoresis 4mg /ml Dexamethasone, and Manual therapy   PLAN FOR NEXT SESSION: Progress therex as indicated     Nuriya Stuck MS, PT 03/20/21 1:20 PM

## 2021-03-22 NOTE — Therapy (Signed)
OUTPATIENT PHYSICAL THERAPY TREATMENT NOTE   Patient Name: Victor MastersDavid C Housholder MRN: [id number] DOB:1955/10/19, 66 y.o., male Today's Date: 03/25/2021  PCP: Wilfrid LundBecker, Anna G, [place] REFERRING PROVIDER: Wilfrid LundBecker, Anna G, [place]   PT End of Session - 03/25/21 1011     Visit Number 3    Number of Visits 13    Date for PT Re-Evaluation 04/25/21    Authorization Type [employer] MEDICARE    Authorization Time Period reassess FOTO on 6th and 10th visits    Progress Note Due on Visit 10    PT Start Time 1013   Pt arrived 10 minutes late to his appointment.   PT Stop Time 1045    PT Time Calculation (min) 32 min    Activity Tolerance Patient limited by pain    Behavior During Therapy WFL for tasks assessed/performed              Past Medical History:  Diagnosis Date   Hemochromatosis    Hypertension    Past Surgical History:  Procedure Laterality Date   ANTERIOR LATERAL LUMBAR FUSION WITH PERCUTANEOUS SCREW 1 LEVEL Right 11/27/2016   Procedure: Lumbar Three-Four Transpsoas Lumbar InterbodyFfusion;  Surgeon: Ditty, Loura HaltBenjamin Jared, MD;  [facility];  Service: Neurosurgery;  Laterality: Right;  L3-4 Transpsoas lumbar interbody fusion/L3-4 Pedicle screw fixation with posterolateral arthrodesis/minimally invasive decompression/Mazor   APPLICATION OF ROBOTIC ASSISTANCE FOR SPINAL PROCEDURE N/A 11/27/2016   Procedure: Lumbar Three-Four Pedicle Screw Fixation with Posterolateral Arthrodesis with Minimally Invasive Decompression with Application of Robotic Assistance;  Surgeon: Ditty, Loura HaltBenjamin Jared, MD;  [facility];  Service: Neurosurgery;  Laterality: N/A;   BACK SURGERY     x3    BACK SURGERY     battery surgery    spinal manipulation under anesthesia     Patient Active Problem List   Diagnosis Date Noted   RUQ abdominal pain    Acute pancreatitis without infection or necrosis 12/02/2020   Pain in left shoulder 11/28/2020   Rheumatoid factor positive 08/29/2020   Polymyalgia rheumatica (HCC)  08/08/2020   Polyarthralgia 08/08/2020   High risk medication use 08/08/2020   Lumbosacral spondylosis with radiculopathy 11/27/2016   Atrophy of muscle of right lower leg 02/04/2014   History of post-polio syndrome 02/02/2014   Pain of left lower extremity 02/02/2014   Chronic back pain 05/14/2013   HTN (hypertension) 05/14/2013   Headache 12/22/2011   Sleep apnea 11/17/2011   Elevation of level of transaminase or lactic acid dehydrogenase (LDH) 09/18/2010   Anxiety 09/08/2010   Tachycardia 09/08/2010   Hyperlipemia 03/01/2006   TIA (transient ischemic attack) 03/01/2006   Tobacco use disorder 03/01/2006   Depressive disorder 10/16/2005    REFERRING DIAG: Chronic left shoulder pain  THERAPY DIAG:  Chronic left shoulder pain  Decreased ROM of left shoulder  Muscle weakness (generalized)  PERTINENT HISTORY: PMR good days/bad days- recent Dx; HTN, hemochromatosis, 4 back surgeries  SUBJECTIVE:                                                                                        SUBJECTIVE STATEMENT: Pt reports feeling about average today, however, he adds  that he has had increased soreness since his last visit. He reports that due to his back pain, he could hardly walk the day after his last session, although he is back to baseline today. He adds that he has had varied adherence to his HEP due to pain.   PERTINENT HISTORY: PMR good days/bad days- recent Dx; HTN, hemochromatosis, 4 back surgeries   PAIN:  Are you having pain? Yes VAS scale: 4/10 with movement, 0/10 at rest Pain location: Shoulder Pain orientation: Left  PAIN TYPE: sharp Pain description: intermittent  Aggravating factors: Moving it, the pain is wrose when his PMR pain is wrose Relieving factors: Rest   PRECAUTIONS: None   WEIGHT BEARING RESTRICTIONS No   OCCUPATION: Disability   PLOF: Independent   PATIENT GOALS: Pain relief   OBJECTIVE:   *Unless otherwise noted, objective information  collected previously*  DIAGNOSTIC FINDINGS:  NA   PATIENT SURVEYS:  FOTO 41% functional status; 65% expected level   COGNITION:          Overall cognitive status: Within functional limits for tasks assessed                               SENSATION:          Light touch: Appears intact, intermittent B arms   POSTURE: Forward head, rounded shoulders, increased thoracic kyphosis, winging scapula   PALPATION: TTP of the post/lat GH area and post. Post. scapula in area of infraspinatus   UPPER EXTREMITY AROM/PROM:   A/PROM Right 03/06/2021 Left 03/06/2021  Shoulder flexion 140 125  Shoulder extension      Shoulder abduction      Shoulder adduction      Shoulder internal rotation T11 L5  Shoulder external rotation T4 C7  Elbow flexion      Elbow extension      Wrist flexion      Wrist extension      Wrist ulnar deviation      Wrist radial deviation      Wrist pronation      Wrist supination      (Blank rows = not tested) AROM: L shoulder pain was provoked c flexion with knees buckling due to the pain PROM: L shoulder PROM was greater than AROM and similar with R shoulder PROM. Crepitus was not noted with PROM. Pain was provoked at the endrange of flexion and abduction. UPPER EXTREMITY MMT:   MMT Right 03/06/2021 Left 03/06/2021  Shoulder flexion   4+  Shoulder extension   4+  Shoulder abduction   4+  Shoulder adduction   5  Shoulder ER   4  Shoulder IR   4  Elbow flexion      Elbow extension      Wrist flexion      Wrist extension      Wrist ulnar deviation      Wrist radial deviation      Wrist pronation      Wrist supination      Grip strength (lbs)      (Blank rows = not tested) Pain was provoked with L shoulder ER and IR and pt reporting popping deep in his L GH jt. At times the pr's knees buckled due to the pain.   SHOULDER SPECIAL TESTS: L shoulder           Impingement tests: Hawkins/Kennedy impingement test: positive  and Painful arc test: positive  Instability tests: Posterior drawer test: negative           Rotator cuff assessment: Empty can test: negative and Full can test: negative; report of pain without overt weakness           Biceps assessment: Yergason's test: negative and Speed's test: negative  TODAY'S TREATMENT:  [facility] Adult PT Treatment:                                                DATE: 03/25/2021 Therapeutic Exercise: UBE 2 min fwd/ 2 min backward while collecting subjective information. Seated reverse chops with 3# cable 2x10 BIL Seated shoulder rolls 2x10 fwd/backward Sidelying Lt shoulder ER with 2# dumbbell 3x8 with 3-sec hold BIL shoulder flexion AAROM with physioball at wall 3x30 seconds with PT perturbations Standing Lt shoulder pendulums 3x30 seconds in-between shoulder flexion AAROM sets Manual Therapy: N/A  Neuromuscular re-ed: N/A Therapeutic Activity: N/A Modalities: N/A Self Care: N/A   [facility] Adult PT Treatment:                                                DATE: 03/20/21 Therapeutic Exercise: Table top shoulder flexion, x10, L and R Shoulder row, 2x15, YTB Shoulder ext, 2x15, YTB Shoulder ER, 2x10, YTB, R and L. L stopped after a few reps due to pain Shoulder IR, 2x10, YTB, R and L    - pt needed to take periodic rest breaks from standing due LBP Self Care: Updated HEP           Eval Treatment -03/05/21 Recommendations for sleeping positions and support      PATIENT EDUCATION: Verbal and tactile cues with new exercises Person educated: Patient Education method: Explanation, Demonstration, Tactile cues, and Verbal cues Education comprehension: verbalized understanding, returned demonstration, verbal cues required, and tactile cues required     HOME EXERCISE PROGRAM: N/A   ASSESSMENT:   CLINICAL IMPRESSION:  Due to pt arriving 10 minutes late to his appointment, the session was limited today. Due to pt report of pain with standing exercises last weak, exercises were primarily  performed in sitting/ on the mat table today. He reports minor increase in pain with resisted shoulder movements, but was able to perform all new exercises today with pain returning to baseline following treatment. He will continue to benefit from skilled PT to address his primary impairments and return to his prior level of function with less limitation.   REHAB POTENTIAL: Fair due to chronicity    CLINICAL DECISION MAKING: Stable/uncomplicated   EVALUATION COMPLEXITY: Low     GOALS:   SHORT TERM GOALS:   STG Name Target Date Goal status  1 Pt will ne Ind in an initial HEP Baseline:  03/26/21 INITIAL  2 Pt will voice understanding of measures to decrease and manage L shoulder pain  Baseline:  03/26/21 INITIAL    LONG TERM GOALS:    LTG Name Target Date Goal status  1 Increase L shoulder strength by 1/2 muscle grade for all movements to improve functional use of the L UE Baseline: 04/25/21 INITIAL  2 Pt will report a decrease in L shoulder pain with daily activities to 4/10 or less for improve function and QOL. Baseline:0-10/10 04/25/21 INITIAL  3 Pt's FOTO score will increase to the the predicted value of 65% as a measure of decreased pain and improved function. Baseline: 41% 04/25/21 INITIAL  4 Pt will be Ind in a final HEP to maintain the achieved LOF Baseline: 04/25/21 INITIAL    PLAN: PT FREQUENCY: 2x/week   PT DURATION: 6 weeks   PLANNED INTERVENTIONS: Therapeutic exercises, Therapeutic activity, Patient/Family education, Joint mobilization, Dry Needling, Electrical stimulation, Cryotherapy, Moist heat, Taping, Vasopneumatic device, Ultrasound, Ionotophoresis 4mg /ml Dexamethasone, and Manual therapy   PLAN FOR NEXT SESSION: Progress therex as indicated     , PT, DPT 03/25/21 10:44 AM

## 2021-03-25 ENCOUNTER — Other Ambulatory Visit: Payer: Self-pay

## 2021-03-25 ENCOUNTER — Ambulatory Visit: Payer: Medicare Other

## 2021-03-25 DIAGNOSIS — M6281 Muscle weakness (generalized): Secondary | ICD-10-CM | POA: Diagnosis not present

## 2021-03-25 DIAGNOSIS — M25512 Pain in left shoulder: Secondary | ICD-10-CM | POA: Diagnosis not present

## 2021-03-25 DIAGNOSIS — M25612 Stiffness of left shoulder, not elsewhere classified: Secondary | ICD-10-CM | POA: Diagnosis not present

## 2021-03-25 DIAGNOSIS — G8929 Other chronic pain: Secondary | ICD-10-CM | POA: Diagnosis not present

## 2021-03-26 NOTE — Therapy (Signed)
OUTPATIENT PHYSICAL THERAPY TREATMENT NOTE   Patient Name: Victor Ramirez MRN: [id number] DOB:09-20-1955, 66 y.o., male Today's Date: 03/27/2021  PCP: Wilfrid Lund, [place] REFERRING PROVIDER: Fuller Plan, MD   PT End of Session - 03/27/21 1015     Visit Number 4    Number of Visits 13    Date for PT Re-Evaluation 04/25/21    Authorization Type [employer] MEDICARE    Authorization Time Period reassess FOTO on 6th and 10th visits    Progress Note Due on Visit 10    PT Start Time 1017   Pt arrived 15 minutes late to his appointment.   PT Stop Time 1043    PT Time Calculation (min) 26 min    Activity Tolerance Patient limited by pain    Behavior During Therapy WFL for tasks assessed/performed               Past Medical History:  Diagnosis Date   Hemochromatosis    Hypertension    Past Surgical History:  Procedure Laterality Date   ANTERIOR LATERAL LUMBAR FUSION WITH PERCUTANEOUS SCREW 1 LEVEL Right 11/27/2016   Procedure: Lumbar Three-Four Transpsoas Lumbar InterbodyFfusion;  Surgeon: Ditty, Loura Halt, MD;  Location: [facility] OR;  Service: Neurosurgery;  Laterality: Right;  L3-4 Transpsoas lumbar interbody fusion/L3-4 Pedicle screw fixation with posterolateral arthrodesis/minimally invasive decompression/Mazor   APPLICATION OF ROBOTIC ASSISTANCE FOR SPINAL PROCEDURE N/A 11/27/2016   Procedure: Lumbar Three-Four Pedicle Screw Fixation with Posterolateral Arthrodesis with Minimally Invasive Decompression with Application of Robotic Assistance;  Surgeon: Ditty, Loura Halt, MD;  Location: [facility] OR;  Service: Neurosurgery;  Laterality: N/A;   BACK SURGERY     x3    BACK SURGERY     battery surgery    spinal manipulation under anesthesia     Patient Active Problem List   Diagnosis Date Noted   RUQ abdominal pain    Acute pancreatitis without infection or necrosis 12/02/2020   Pain in left shoulder 11/28/2020   Rheumatoid factor positive 08/29/2020   Polymyalgia rheumatica  (HCC) 08/08/2020   Polyarthralgia 08/08/2020   High risk medication use 08/08/2020   Lumbosacral spondylosis with radiculopathy 11/27/2016   Atrophy of muscle of right lower leg 02/04/2014   History of post-polio syndrome 02/02/2014   Pain of left lower extremity 02/02/2014   Chronic back pain 05/14/2013   HTN (hypertension) 05/14/2013   Headache 12/22/2011   Sleep apnea 11/17/2011   Elevation of level of transaminase or lactic acid dehydrogenase (LDH) 09/18/2010   Anxiety 09/08/2010   Tachycardia 09/08/2010   Hyperlipemia 03/01/2006   TIA (transient ischemic attack) 03/01/2006   Tobacco use disorder 03/01/2006   Depressive disorder 10/16/2005    REFERRING DIAG: Chronic left shoulder pain  THERAPY DIAG:  Chronic left shoulder pain  Decreased ROM of left shoulder  Muscle weakness (generalized)  PERTINENT HISTORY: PMR good days/bad days- recent Dx; HTN, hemochromatosis, 4 back surgeries  SUBJECTIVE:                                                                                        SUBJECTIVE STATEMENT: Pt reports 3/10 Lt shoulder pain today and  adds that he wasn't able to sleep well last night due to rolling onto his Lt shoulder during his sleep. Pt adds that after his last appointment, he had to put his dog down, which has caused additional emotional hardship this week. This has affected his ability to perform his HEP.   PERTINENT HISTORY: PMR good days/bad days- recent Dx; HTN, hemochromatosis, 4 back surgeries   PAIN:  Are you having pain? Yes VAS scale: 3/10 with movement, 0/10 at rest Pain location: Shoulder Pain orientation: Left  PAIN TYPE: sharp Pain description: intermittent  Aggravating factors: Moving it, the pain is wrose when his PMR pain is wrose Relieving factors: Rest   PRECAUTIONS: None   WEIGHT BEARING RESTRICTIONS No   OCCUPATION: Disability   PLOF: Independent   PATIENT GOALS: Pain relief   OBJECTIVE:   *Unless otherwise noted,  objective information collected previously*  DIAGNOSTIC FINDINGS:  NA   PATIENT SURVEYS:  FOTO 41% functional status; 65% expected level   COGNITION:          Overall cognitive status: Within functional limits for tasks assessed                               SENSATION:          Light touch: Appears intact, intermittent B arms   POSTURE: Forward head, rounded shoulders, increased thoracic kyphosis, winging scapula   PALPATION: TTP of the post/lat GH area and post. Post. scapula in area of infraspinatus   UPPER EXTREMITY AROM/PROM:   A/PROM Right 03/06/2021 Left 03/06/2021 Right 03/27/2021 Left 03/27/2021  Shoulder flexion 140 125 160 100  Shoulder extension        Shoulder abduction        Shoulder adduction        Shoulder internal rotation T11 L5  L5  Shoulder external rotation T4 C7  C7  Elbow flexion        Elbow extension        Wrist flexion        Wrist extension        Wrist ulnar deviation        Wrist radial deviation        Wrist pronation        Wrist supination        (Blank rows = not tested) AROM: L shoulder pain was provoked c flexion with knees buckling due to the pain PROM: L shoulder PROM was greater than AROM and similar with R shoulder PROM. Crepitus was not noted with PROM. Pain was provoked at the endrange of flexion and abduction. UPPER EXTREMITY MMT:   MMT Right 03/06/2021 Left 03/06/2021  Shoulder flexion   4+  Shoulder extension   4+  Shoulder abduction   4+  Shoulder adduction   5  Shoulder ER   4  Shoulder IR   4  Elbow flexion      Elbow extension      Wrist flexion      Wrist extension      Wrist ulnar deviation      Wrist radial deviation      Wrist pronation      Wrist supination      Grip strength (lbs)      (Blank rows = not tested) Pain was provoked with L shoulder ER and IR and pt reporting popping deep in his L GH jt. At times the pr's knees buckled due  to the pain.   SHOULDER SPECIAL TESTS: L shoulder            Impingement tests: Hawkins/Kennedy impingement test: positive  and Painful arc test: positive            Instability tests: Posterior drawer test: negative           Rotator cuff assessment: Empty can test: negative and Full can test: negative; report of pain without overt weakness           Biceps assessment: Yergason's test: negative and Speed's test: negative  TODAY'S TREATMENT:   [facility] Adult PT Treatment:                                                DATE: 03/27/2021 Therapeutic Exercise: UBE level 1 x12min forward, 2 min backward while collecting subjective information Seated shoulder ER with 3# cable 2x10 BIL Supine Lt shoulder scaption AAROM x10 with 5-sec hold Supine Lt shoulder ER AAROM x10 with 5-sec hold Seated reverse chops with 3# cable 2x10 BIL Manual Therapy: N/A  Neuromuscular re-ed: N/A Therapeutic Activity: N/A Modalities: N/A Self Care: N/A   [facility] Adult PT Treatment:                                                DATE: 03/25/2021 Therapeutic Exercise: UBE 2 min fwd/ 2 min backward while collecting subjective information. Seated reverse chops with 3# cable 2x10 BIL Seated shoulder rolls 2x10 fwd/backward Sidelying Lt shoulder ER with 2# dumbbell 3x8 with 3-sec hold BIL shoulder flexion AAROM with physioball at wall 3x30 seconds with PT perturbations Standing Lt shoulder pendulums 3x30 seconds in-between shoulder flexion AAROM sets Manual Therapy: N/A  Neuromuscular re-ed: N/A Therapeutic Activity: N/A Modalities: N/A Self Care: N/A      PATIENT EDUCATION: Verbal and tactile cues with new exercises Person educated: Patient Education method: Explanation, Demonstration, Tactile cues, and Verbal cues Education comprehension: verbalized understanding, returned demonstration, verbal cues required, and tactile cues required     HOME EXERCISE PROGRAM: N/A   ASSESSMENT:   CLINICAL IMPRESSION:  Pt arrived 15 minutes late to his appointment today,  resulting in a truncated treatment session. He demonstrates continued limitation in Lt shoulder AROM, having made no improvement and, in some cases, regression in his range. He was able to tolerate selected exercises today, although he continues to have pain mid-range through shoulder elevation which causes him to experience a full-body twitch each time he goes into the motion. This is also present with AAROM. He tolerates exercises into shoulder ER at 0 degrees of flexion better than exercises involving shoulder elevation. He will continue to benefit from skilled PT to address his primary impairments and return to his prior level of function with less limitation.   REHAB POTENTIAL: Fair due to chronicity    CLINICAL DECISION MAKING: Stable/uncomplicated   EVALUATION COMPLEXITY: Low     GOALS:   SHORT TERM GOALS:   STG Name Target Date Goal status  1 Pt will ne Ind in an initial HEP Baseline: Pt reports varied adherence to his HEP 03/26/21 Progressing  2 Pt will voice understanding of measures to decrease and manage L shoulder pain  Baseline:  03/26/21 INITIAL  LONG TERM GOALS:    LTG Name Target Date Goal status  1 Increase L shoulder strength by 1/2 muscle grade for all movements to improve functional use of the L UE Baseline: 04/25/21 INITIAL  2 Pt will report a decrease in L shoulder pain with daily activities to 4/10 or less for improve function and QOL. Baseline:0-10/10 04/25/21 Progressing  3 Pt's FOTO score will increase to the the predicted value of 65% as a measure of decreased pain and improved function. Baseline: 41% 04/25/21 INITIAL  4 Pt will be Ind in a final HEP to maintain the achieved LOF Baseline: Pt reports varied adherence to his HEP 04/25/21 Progressing    PLAN: PT FREQUENCY: 2x/week   PT DURATION: 6 weeks   PLANNED INTERVENTIONS: Therapeutic exercises, Therapeutic activity, Patient/Family education, Joint mobilization, Dry Needling, Electrical stimulation,  Cryotherapy, Moist heat, Taping, Vasopneumatic device, Ultrasound, Ionotophoresis 4mg /ml Dexamethasone, and Manual therapy   PLAN FOR NEXT SESSION: Progress therex as indicated     Carmelina DaneYarborough, Nancyjo Givhan, PT, DPT 03/27/21 10:37 AM

## 2021-03-27 ENCOUNTER — Ambulatory Visit: Payer: Medicare Other

## 2021-03-27 ENCOUNTER — Other Ambulatory Visit: Payer: Self-pay

## 2021-03-27 DIAGNOSIS — G8929 Other chronic pain: Secondary | ICD-10-CM

## 2021-03-27 DIAGNOSIS — M6281 Muscle weakness (generalized): Secondary | ICD-10-CM

## 2021-03-27 DIAGNOSIS — M25512 Pain in left shoulder: Secondary | ICD-10-CM | POA: Diagnosis not present

## 2021-03-27 DIAGNOSIS — M25612 Stiffness of left shoulder, not elsewhere classified: Secondary | ICD-10-CM

## 2021-03-29 NOTE — Therapy (Signed)
OUTPATIENT PHYSICAL THERAPY TREATMENT NOTE   Patient Name: Victor Ramirez MRN: [id number] DOB:[birth year], 66 y.o., male Today's Date: 04/01/2021  PCP: Wilfrid Lund, [place] REFERRING PROVIDER: Fuller Plan, MD   PT End of Session - 04/01/21 1012     Visit Number 5    Number of Visits 13    Date for PT Re-Evaluation 04/25/21    Authorization Type [employer] MEDICARE    Authorization Time Period reassess FOTO on 6th and 10th visits    Progress Note Due on Visit 10    PT Start Time 1016   Pt arrived 15 minutes late to his appointment.   PT Stop Time 1044    PT Time Calculation (min) 28 min    Activity Tolerance Patient limited by pain    Behavior During Therapy WFL for tasks assessed/performed                Past Medical History:  Diagnosis Date   Hemochromatosis    Hypertension    Past Surgical History:  Procedure Laterality Date   ANTERIOR LATERAL LUMBAR FUSION WITH PERCUTANEOUS SCREW 1 LEVEL Right 11/27/2016   Procedure: Lumbar Three-Four Transpsoas Lumbar InterbodyFfusion;  Surgeon: Ditty, Loura Halt, MD;  Location: [facility] OR;  Service: Neurosurgery;  Laterality: Right;  L3-4 Transpsoas lumbar interbody fusion/L3-4 Pedicle screw fixation with posterolateral arthrodesis/minimally invasive decompression/Mazor   APPLICATION OF ROBOTIC ASSISTANCE FOR SPINAL PROCEDURE N/A 11/27/2016   Procedure: Lumbar Three-Four Pedicle Screw Fixation with Posterolateral Arthrodesis with Minimally Invasive Decompression with Application of Robotic Assistance;  Surgeon: Ditty, Loura Halt, MD;  Location: [facility] OR;  Service: Neurosurgery;  Laterality: N/A;   BACK SURGERY     x3    BACK SURGERY     battery surgery    spinal manipulation under anesthesia     Patient Active Problem List   Diagnosis Date Noted   RUQ abdominal pain    Acute pancreatitis without infection or necrosis 12/02/2020   Pain in left shoulder 11/28/2020   Rheumatoid factor positive 08/29/2020   Polymyalgia  rheumatica (HCC) 08/08/2020   Polyarthralgia 08/08/2020   High risk medication use 08/08/2020   Lumbosacral spondylosis with radiculopathy 11/27/2016   Atrophy of muscle of right lower leg 02/04/2014   History of post-polio syndrome 02/02/2014   Pain of left lower extremity 02/02/2014   Chronic back pain 05/14/2013   HTN (hypertension) 05/14/2013   Headache 12/22/2011   Sleep apnea 11/17/2011   Elevation of level of transaminase or lactic acid dehydrogenase (LDH) 09/18/2010   Anxiety 09/08/2010   Tachycardia 09/08/2010   Hyperlipemia 03/01/2006   TIA (transient ischemic attack) 03/01/2006   Tobacco use disorder 03/01/2006   Depressive disorder 10/16/2005    REFERRING DIAG: Chronic left shoulder pain  THERAPY DIAG:  Chronic left shoulder pain  Decreased ROM of left shoulder  Muscle weakness (generalized)  PERTINENT HISTORY: PMR good days/bad days- recent Dx; HTN, hemochromatosis, 4 back surgeries  SUBJECTIVE:                                                                                        SUBJECTIVE STATEMENT: Pt reports that he is feeling great  today, adding that when he doesn't sleep on his shoulder or use it regularly, he feels much better. He adds that he has been doing his HEP nearly every day.   PERTINENT HISTORY: PMR good days/bad days- recent Dx; HTN, hemochromatosis, 4 back surgeries   PAIN:  Are you having pain? No VAS scale: 0/10 Pain location: Shoulder Pain orientation: Left  PAIN TYPE: sharp Pain description: intermittent  Aggravating factors: Moving it, the pain is wrose when his PMR pain is wrose Relieving factors: Rest   PRECAUTIONS: None   WEIGHT BEARING RESTRICTIONS No   OCCUPATION: Disability   PLOF: Independent   PATIENT GOALS: Pain relief   OBJECTIVE:   *Unless otherwise noted, objective information collected previously*  DIAGNOSTIC FINDINGS:  NA   PATIENT SURVEYS:  FOTO 41% functional status; 65% expected level    COGNITION:          Overall cognitive status: Within functional limits for tasks assessed                               SENSATION:          Light touch: Appears intact, intermittent B arms   POSTURE: Forward head, rounded shoulders, increased thoracic kyphosis, winging scapula   PALPATION: TTP of the post/lat GH area and post. Post. scapula in area of infraspinatus   UPPER EXTREMITY AROM/PROM:   A/PROM Right 03/06/2021 Left 03/06/2021 Right 03/27/2021 Left 03/27/2021  Shoulder flexion 140 125 160 100  Shoulder extension        Shoulder abduction        Shoulder adduction        Shoulder internal rotation T11 L5  L5  Shoulder external rotation T4 C7  C7  Elbow flexion        Elbow extension        Wrist flexion        Wrist extension        Wrist ulnar deviation        Wrist radial deviation        Wrist pronation        Wrist supination        (Blank rows = not tested) AROM: L shoulder pain was provoked c flexion with knees buckling due to the pain PROM: L shoulder PROM was greater than AROM and similar with R shoulder PROM. Crepitus was not noted with PROM. Pain was provoked at the endrange of flexion and abduction. UPPER EXTREMITY MMT:   MMT Right 03/06/2021 Left 03/06/2021  Shoulder flexion   4+  Shoulder extension   4+  Shoulder abduction   4+  Shoulder adduction   5  Shoulder ER   4  Shoulder IR   4  Elbow flexion      Elbow extension      Wrist flexion      Wrist extension      Wrist ulnar deviation      Wrist radial deviation      Wrist pronation      Wrist supination      Grip strength (lbs)      (Blank rows = not tested) Pain was provoked with L shoulder ER and IR and pt reporting popping deep in his L GH jt. At times the pr's knees buckled due to the pain.   SHOULDER SPECIAL TESTS: L shoulder           Impingement tests: Hawkins/Kennedy impingement test: positive  and Painful arc test: positive            Instability tests: Posterior drawer test:  negative           Rotator cuff assessment: Empty can test: negative and Full can test: negative; report of pain without overt weakness           Biceps assessment: Yergason's test: negative and Speed's test: negative  TODAY'S TREATMENT:   [facility] Adult PT Treatment:                                                DATE: 04/01/2021 Therapeutic Exercise: UBE level 1 x312min forward, 2 min backward while collecting subjective information BIL shoulder flexion AAROM with physioball at wall 3x30 seconds with PT perturbations Standing Lt shoulder pendulums 3x30 seconds in-between shoulder flexion AAROM sets Seated BIL shoulder scaption with 2# dumbbells 2x10 Sidelying Lt shoulder ER with 3# dumbbell 3x8 with 3-sec hold Manual Therapy: N/A Neuromuscular re-ed: N/A Therapeutic Activity: N/A Modalities: N/A Self Care: N/A   [facility] Adult PT Treatment:                                                DATE: 03/27/2021 Therapeutic Exercise: UBE level 1 x732min forward, 2 min backward while collecting subjective information Seated shoulder ER with 3# cable 2x10 BIL Supine Lt shoulder scaption AAROM x10 with 5-sec hold Supine Lt shoulder ER AAROM x10 with 5-sec hold Seated reverse chops with 3# cable 2x10 BIL Manual Therapy: N/A  Neuromuscular re-ed: N/A Therapeutic Activity: N/A Modalities: N/A Self Care: N/A   [facility] Adult PT Treatment:                                                DATE: 03/25/2021 Therapeutic Exercise: UBE 2 min fwd/ 2 min backward while collecting subjective information. Seated reverse chops with 3# cable 2x10 BIL Seated shoulder rolls 2x10 fwd/backward Sidelying Lt shoulder ER with 2# dumbbell 3x8 with 3-sec hold BIL shoulder flexion AAROM with physioball at wall 3x30 seconds with PT perturbations Standing Lt shoulder pendulums 3x30 seconds in-between shoulder flexion AAROM sets Manual Therapy: N/A  Neuromuscular re-ed: N/A Therapeutic  Activity: N/A Modalities: N/A Self Care: N/A      PATIENT EDUCATION: Verbal and tactile cues with new exercises Person educated: Patient Education method: Explanation, Demonstration, Tactile cues, and Verbal cues Education comprehension: verbalized understanding, returned demonstration, verbal cues required, and tactile cues required     HOME EXERCISE PROGRAM: N/A   ASSESSMENT:   CLINICAL IMPRESSION:  Pt arrived 15 minutes late to his appointment today, leading to a truncated treatment session. He again experiences heightened pain with any exercises involving posterior shoulder rotation or elevation past 90 degrees, although this pain is typically short-lived. He reports that he has seen a minor improvement in overall sxs since starting PT. Discussed with pt the importance of arriving to his visit on time since there has been a pattern of late arrivals at this point. He reports understanding. The pt will continue to benefit from skilled PT to address his primary impairments and return  to his prior level of function with less limitation.   REHAB POTENTIAL: Fair due to chronicity    CLINICAL DECISION MAKING: Stable/uncomplicated   EVALUATION COMPLEXITY: Low     GOALS:   SHORT TERM GOALS:   STG Name Target Date Goal status  1 Pt will ne Ind in an initial HEP Baseline: Pt reports varied adherence to his HEP 03/26/21 Progressing  2 Pt will voice understanding of measures to decrease and manage L shoulder pain  Baseline:  03/26/21 INITIAL    LONG TERM GOALS:    LTG Name Target Date Goal status  1 Increase L shoulder strength by 1/2 muscle grade for all movements to improve functional use of the L UE Baseline: 04/25/21 INITIAL  2 Pt will report a decrease in L shoulder pain with daily activities to 4/10 or less for improve function and QOL. Baseline:0-10/10 04/25/21 Progressing  3 Pt's FOTO score will increase to the the predicted value of 65% as a measure of decreased pain and  improved function. Baseline: 41% 04/25/21 INITIAL  4 Pt will be Ind in a final HEP to maintain the achieved LOF Baseline: Pt reports varied adherence to his HEP 04/25/21 Progressing    PLAN: PT FREQUENCY: 2x/week   PT DURATION: 6 weeks   PLANNED INTERVENTIONS: Therapeutic exercises, Therapeutic activity, Patient/Family education, Joint mobilization, Dry Needling, Electrical stimulation, Cryotherapy, Moist heat, Taping, Vasopneumatic device, Ultrasound, Ionotophoresis 4mg /ml Dexamethasone, and Manual therapy   PLAN FOR NEXT SESSION: Progress therex as indicated     , PT, DPT 04/01/21 10:43 AM

## 2021-03-31 NOTE — Progress Notes (Deleted)
Office Visit Note  Patient: Victor Ramirez             Date of Birth: [birth year]           MRN: [id number]             PCP: Lois Huxley, PA Referring: Lois Huxley, [place] Visit Date: 04/01/2021   Subjective:  No chief complaint on file.   History of Present Illness: Victor Ramirez is a 66 y.o. male here for follow up for PMR on prednisone 5 mg daily and referred to PT for left shoulder pain.***   Previous HPI 01/29/21 Victor Ramirez is a 66 y.o. male here for follow up for PMR on prednisone 7.5 mg daily. Since our last visit he was at the hospital last month with abdominal pain workup thought to represent pancreatitis. Symptoms are overall doing better in most areas except right hand, left shoulder, and neck. Left shoulder steroid injection last visit helped for about a month but then had worsening again. His right hand is hurting more in the wrist and hand going numb intermittently. Especially notices this during prolonged driving, and improves keeping wrist in neutral position.  08/08/20 Victor Ramirez is a 66 y.o. male referred for PMR. His symptoms started abruptly with severe pain in bilateral shoulders limiting use and mobility especially first thing in the morning.  He does not require any preceding injury or changes in health or medications. For evaluation and primary care clinic with x-ray imaging obtained that did not show any structural cause of symptoms he had markedly high inflammatory markers and was started on oral prednisone for suspected PMR.  He noticed dramatic improvement in symptoms within 2 days of starting the medicine.  At follow-up he was recommended to decrease the dose down to 15 mg of prednisone at which time he noticed a return of his symptoms.  He increased back to 20 mg and feels this is doing well again.  The most affected areas were in the base of his neck and bilateral upper back and shoulders with radiation down the upper portion of the arms.  He also  felt like it was affecting his right hip and thigh much worse than left, where he has chronic deficits due to history of polio.  He has started to notice some weight gain with the oral prednisone otherwise no specific side effect problems.   No Rheumatology ROS completed.   PMFS History:  Patient Active Problem List   Diagnosis Date Noted   RUQ abdominal pain    Acute pancreatitis without infection or necrosis 12/02/2020   Pain in left shoulder 11/28/2020   Rheumatoid factor positive 08/29/2020   Polymyalgia rheumatica ([place]) 08/08/2020   Polyarthralgia 08/08/2020   High risk medication use 08/08/2020   Lumbosacral spondylosis with radiculopathy 11/27/2016   Atrophy of muscle of right lower leg 02/04/2014   History of post-polio syndrome 02/02/2014   Pain of left lower extremity 02/02/2014   Chronic back pain 05/14/2013   HTN (hypertension) 05/14/2013   Headache 12/22/2011   Sleep apnea 11/17/2011   Elevation of level of transaminase or lactic acid dehydrogenase (LDH) 09/18/2010   Anxiety 09/08/2010   Tachycardia 09/08/2010   Hyperlipemia 03/01/2006   TIA (transient ischemic attack) 03/01/2006   Tobacco use disorder 03/01/2006   Depressive disorder 10/16/2005    Past Medical History:  Diagnosis Date   Hemochromatosis    Hypertension     Family History  Problem Relation Age  of Onset   Arthritis Mother    Thyroid cancer Mother    Melanoma Father    Fibromyalgia Sister    Cancer Brother    Prostate cancer Brother    Williams syndrome Son    Past Surgical History:  Procedure Laterality Date   ANTERIOR LATERAL LUMBAR FUSION WITH PERCUTANEOUS SCREW 1 LEVEL Right 11/27/2016   Procedure: Lumbar Three-Four Transpsoas Lumbar InterbodyFfusion;  Surgeon: Ditty, Kevan Ny, MD;  Location: [place];  Service: Neurosurgery;  Laterality: Right;  L3-4 Transpsoas lumbar interbody fusion/L3-4 Pedicle screw fixation with posterolateral arthrodesis/minimally invasive decompression/Mazor    APPLICATION OF ROBOTIC ASSISTANCE FOR SPINAL PROCEDURE N/A 11/27/2016   Procedure: Lumbar Three-Four Pedicle Screw Fixation with Posterolateral Arthrodesis with Minimally Invasive Decompression with Application of Robotic Assistance;  Surgeon: Ditty, Kevan Ny, MD;  Location: [place];  Service: Neurosurgery;  Laterality: N/A;   BACK SURGERY     x3    BACK SURGERY     battery surgery    spinal manipulation under anesthesia     Social History   Social History Narrative   Not on file   Immunization History  Administered Date(s) Administered   PFIZER(Purple Top)SARS-COV-2 Vaccination 06/01/2019, 07/01/2019     Objective: Vital Signs: There were no vitals taken for this visit.   Physical Exam   Musculoskeletal Exam: ***  CDAI Exam: CDAI Score: -- Patient Global: --; Provider Global: -- Swollen: --; Tender: -- Joint Exam 04/01/2021   No joint exam has been documented for this visit   There is currently no information documented on the homunculus. Go to the Rheumatology activity and complete the homunculus joint exam.  Investigation: No additional findings.  Imaging: No results found.  Recent Labs: Lab Results  Component Value Date   WBC 14.9 (H) 12/03/2020   HGB 13.9 12/03/2020   PLT 256 12/03/2020   NA 134 (L) 12/03/2020   K 4.0 12/03/2020   CL 100 12/03/2020   CO2 22 12/03/2020   GLUCOSE 77 12/03/2020   BUN 11 12/03/2020   CREATININE 0.81 12/03/2020   BILITOT 1.0 12/03/2020   ALKPHOS 71 12/03/2020   AST 33 12/03/2020   ALT 46 (H) 12/03/2020   PROT 7.0 12/03/2020   ALBUMIN 3.6 12/03/2020   CALCIUM 8.9 12/03/2020   GFRAA 111 08/08/2020    Speciality Comments: No specialty comments available.  Procedures:  No procedures performed Allergies: Bee venom   Assessment / Plan:     Visit Diagnoses: No diagnosis found.  ***  Orders: No orders of the defined types were placed in this encounter.  No orders of the defined types were placed in this  encounter.    Follow-Up Instructions: No follow-ups on file.   Collier Salina, MD  Note - This record has been created using [employer].  Chart creation errors have been sought, but may not always  have been located. Such creation errors do not reflect on  the standard of medical care.

## 2021-04-01 ENCOUNTER — Other Ambulatory Visit: Payer: Self-pay

## 2021-04-01 ENCOUNTER — Ambulatory Visit: Payer: Medicare Other

## 2021-04-01 ENCOUNTER — Ambulatory Visit: Payer: Medicare Other | Admitting: Internal Medicine

## 2021-04-01 DIAGNOSIS — M25612 Stiffness of left shoulder, not elsewhere classified: Secondary | ICD-10-CM

## 2021-04-01 DIAGNOSIS — M6281 Muscle weakness (generalized): Secondary | ICD-10-CM

## 2021-04-01 DIAGNOSIS — M25512 Pain in left shoulder: Secondary | ICD-10-CM

## 2021-04-01 DIAGNOSIS — G8929 Other chronic pain: Secondary | ICD-10-CM

## 2021-04-02 NOTE — Therapy (Signed)
OUTPATIENT PHYSICAL THERAPY TREATMENT NOTE   Patient Name: Victor Ramirez MRN: [id number] DOB:[birth year], 66 y.o., male Today's Date: 04/03/2021  PCP: Lois Huxley, [place] REFERRING PROVIDER: Collier Salina, MD   PT End of Session - 04/03/21 1150     Visit Number 6    Number of Visits 13    Date for PT Re-Evaluation 04/25/21    Authorization Type [employer] MEDICARE    Authorization Time Period reassess FOTO on 6th and 10th visits    Progress Note Due on Visit 10    PT Start Time 1149    PT Stop Time 1235    PT Time Calculation (min) 46 min    Activity Tolerance Patient limited by pain    Behavior During Therapy [facility] for tasks assessed/performed                 Past Medical History:  Diagnosis Date   Hemochromatosis    Hypertension    Past Surgical History:  Procedure Laterality Date   ANTERIOR LATERAL LUMBAR FUSION WITH PERCUTANEOUS SCREW 1 LEVEL Right 11/27/2016   Procedure: Lumbar Three-Four Transpsoas Lumbar InterbodyFfusion;  Surgeon: Ditty, Kevan Ny, MD;  Location: [place];  Service: Neurosurgery;  Laterality: Right;  L3-4 Transpsoas lumbar interbody fusion/L3-4 Pedicle screw fixation with posterolateral arthrodesis/minimally invasive decompression/Mazor   APPLICATION OF ROBOTIC ASSISTANCE FOR SPINAL PROCEDURE N/A 11/27/2016   Procedure: Lumbar Three-Four Pedicle Screw Fixation with Posterolateral Arthrodesis with Minimally Invasive Decompression with Application of Robotic Assistance;  Surgeon: Ditty, Kevan Ny, MD;  Location: [place];  Service: Neurosurgery;  Laterality: N/A;   BACK SURGERY     x3    BACK SURGERY     battery surgery    spinal manipulation under anesthesia     Patient Active Problem List   Diagnosis Date Noted   RUQ abdominal pain    Acute pancreatitis without infection or necrosis 12/02/2020   Pain in left shoulder 11/28/2020   Rheumatoid factor positive 08/29/2020   Polymyalgia rheumatica ([place]) 08/08/2020   Polyarthralgia  08/08/2020   High risk medication use 08/08/2020   Lumbosacral spondylosis with radiculopathy 11/27/2016   Atrophy of muscle of right lower leg 02/04/2014   History of post-polio syndrome 02/02/2014   Pain of left lower extremity 02/02/2014   Chronic back pain 05/14/2013   HTN (hypertension) 05/14/2013   Headache 12/22/2011   Sleep apnea 11/17/2011   Elevation of level of transaminase or lactic acid dehydrogenase (LDH) 09/18/2010   Anxiety 09/08/2010   Tachycardia 09/08/2010   Hyperlipemia 03/01/2006   TIA (transient ischemic attack) 03/01/2006   Tobacco use disorder 03/01/2006   Depressive disorder 10/16/2005    REFERRING DIAG: Chronic left shoulder pain  THERAPY DIAG:  Chronic left shoulder pain  Decreased ROM of left shoulder  Muscle weakness (generalized)  PERTINENT HISTORY: PMR good days/bad days- recent Dx; HTN, hemochromatosis, 4 back surgeries  SUBJECTIVE:                                                                                        SUBJECTIVE STATEMENT: My L shoulder is better and doesn't hurt all the time. With above chest L  arm movements, the pain can be severe. I'm managing the use of my l arm better.    PERTINENT HISTORY: PMR good days/bad days- recent Dx; HTN, hemochromatosis, 4 back surgeries   PAIN:  Are you having pain? No VAS scale: 1/10; 3/10 with PT Pain location: Shoulder Pain orientation: Left  PAIN TYPE: sharp Pain description: intermittent  Aggravating factors: Moving it, the pain is wrose when his PMR pain is wrose Relieving factors: Rest   PRECAUTIONS: None   WEIGHT BEARING RESTRICTIONS No   OCCUPATION: Disability   PLOF: Independent   PATIENT GOALS: Pain relief   OBJECTIVE:   *Unless otherwise noted, objective information collected previously*  DIAGNOSTIC FINDINGS:  NA   PATIENT SURVEYS:  FOTO 41% functional status; 65% expected level. 2/2/3: 41%   COGNITION:          Overall cognitive status: Within functional  limits for tasks assessed                               SENSATION:          Light touch: Appears intact, intermittent B arms   POSTURE: Forward head, rounded shoulders, increased thoracic kyphosis, winging scapula   PALPATION: TTP of the post/lat GH area and post. Post. scapula in area of infraspinatus   UPPER EXTREMITY AROM/PROM:   A/PROM Right 03/06/2021 Left 03/06/2021 Right 03/27/2021 Left 03/27/2021 Left 04/03/30  Shoulder flexion 140 125 160 100 130  Shoulder extension         Shoulder abduction         Shoulder adduction         Shoulder internal rotation T11 L5  L5   Shoulder external rotation T4 C7  C7   Elbow flexion         Elbow extension         Wrist flexion         Wrist extension         Wrist ulnar deviation         Wrist radial deviation         Wrist pronation         Wrist supination         (Blank rows = not tested) AROM: L shoulder pain was provoked c flexion with knees buckling due to the pain PROM: L shoulder PROM was greater than AROM and similar with R shoulder PROM. Crepitus was not noted with PROM. Pain was provoked at the endrange of flexion and abduction. UPPER EXTREMITY MMT:   MMT Right 03/06/2021 Left 03/06/2021  Shoulder flexion   4+  Shoulder extension   4+  Shoulder abduction   4+  Shoulder adduction   5  Shoulder ER   4  Shoulder IR   4  Elbow flexion      Elbow extension      Wrist flexion      Wrist extension      Wrist ulnar deviation      Wrist radial deviation      Wrist pronation      Wrist supination      Grip strength (lbs)      (Blank rows = not tested) Pain was provoked with L shoulder ER and IR and pt reporting popping deep in his L GH jt. At times the pr's knees buckled due to the pain.   SHOULDER SPECIAL TESTS: L shoulder  Impingement tests: Hawkins/Kennedy impingement test: positive  and Painful arc test: positive            Instability tests: Posterior drawer test: negative           Rotator cuff assessment:  Empty can test: negative and Full can test: negative; report of pain without overt weakness           Biceps assessment: Yergason's test: negative and Speed's test: negative   TODAY'S TREATMENT:   [facility] Adult PT Treatment:                                                DATE: 04/03/21 Therapeutic Exercise: UBE level 1 x49mn forward, 2 min backward while collecting subjective information BIL shoulder flexion AAROM with physioball at wall 3x30 seconds with PT perturbations Lt shoulder ER with 3# FM was not tolerated, completed 3x10with 3-sec hold Seated BIL shoulder scaption with 2# dumbbells 2x10 Seated reverse chops with 5# 2x10 BIL Seated shoulder row 2x10, GTB Seated shoulder ext 2x10, GTB Seated bicep curls 2x12, 8#  [facility] Adult PT Treatment:                                                DATE: 04/01/2021 Therapeutic Exercise: UBE level 1 x255m forward, 2 min backward while collecting subjective information BIL shoulder flexion AAROM with physioball at wall 3x30 seconds with PT perturbations Standing Lt shoulder pendulums 3x30 seconds in-between shoulder flexion AAROM sets Seated BIL shoulder scaption with 2# dumbbells 2x10 Sidelying Lt shoulder ER with 3# dumbbell 3x8 with 3-sec hold Manual Therapy: N/A Neuromuscular re-ed: N/A Therapeutic Activity: N/A Modalities: N/A Self Care: N/A   [facility] Adult PT Treatment:                                                DATE: 03/27/2021 Therapeutic Exercise: UBE level 1 x2m82mforward, 2 min backward while collecting subjective information Seated shoulder ER with 3# cable 2x10 BIL Supine Lt shoulder scaption AAROM x10 with 5-sec hold Supine Lt shoulder ER AAROM x10 with 5-sec hold Seated reverse chops with 3# cable 2x10 BIL Manual Therapy: N/A  Neuromuscular re-ed: N/A Therapeutic Activity: N/A Modalities: N/A Self Care: N/A   [facility] Adult PT Treatment:                                                DATE: 03/25/2021 Therapeutic  Exercise: UBE 2 min fwd/ 2 min backward while collecting subjective information. Seated reverse chops with 3# cable 2x10 BIL Seated shoulder rolls 2x10 fwd/backward Sidelying Lt shoulder ER with 2# dumbbell 3x8 with 3-sec hold BIL shoulder flexion AAROM with physioball at wall 3x30 seconds with PT perturbations Standing Lt shoulder pendulums 3x30 seconds in-between shoulder flexion AAROM sets Manual Therapy: N/A  Neuromuscular re-ed: N/A Therapeutic Activity: N/A Modalities: N/A Self Care: N/A   PATIENT EDUCATION: Verbal and tactile cues with new exercises Person educated: Patient Education method: Explanation, Demonstration, Tactile  cues, and Verbal cues Education comprehension: verbalized understanding, returned demonstration, verbal cues required, and tactile cues required     HOME EXERCISE PROGRAM: N/A   ASSESSMENT:   CLINICAL IMPRESSION:  Pt's subjective report indicates improve L shoulder pain c therex and managing use of the L arm. Reassessment of FOTO was found the same as on eval which is accurate to the pt's presentation where therex can be modified to be completed c minimize pain and improve strength, however less controlled use of the L arm is painful and limits his function. With l shoulder flexion, pt demonstrated improved ROM to 130d. Pt tolerated today's PT session without adverse effects.   REHAB POTENTIAL: Fair due to chronicity    CLINICAL DECISION MAKING: Stable/uncomplicated   EVALUATION COMPLEXITY: Low     GOALS:   SHORT TERM GOALS:   STG Name Target Date Goal status  1 Pt will ne Ind in an initial HEP Baseline: Pt reports varied adherence to his HEP 03/26/21 Progressing  2 Pt will voice understanding of measures to decrease and manage L shoulder pain  Baseline:  03/26/21 Met 04/03/21    LONG TERM GOALS:    LTG Name Target Date Goal status  1 Increase L shoulder strength by 1/2 muscle grade for all movements to improve functional use of the L  UE Baseline: 04/25/21 INITIAL  2 Pt will report a decrease in L shoulder pain with daily activities to 4/10 or less for improve function and QOL. Baseline:0-10/10 04/25/21 Progressing  3 Pt's FOTO score will increase to the the predicted value of 65% as a measure of decreased pain and improved function. Baseline: 41% 04/25/21 INITIAL  4 Pt will be Ind in a final HEP to maintain the achieved LOF Baseline: Pt reports varied adherence to his HEP 04/25/21 Progressing    PLAN: PT FREQUENCY: 2x/week   PT DURATION: 6 weeks   PLANNED INTERVENTIONS: Therapeutic exercises, Therapeutic activity, Patient/Family education, Joint mobilization, Dry Needling, Electrical stimulation, Cryotherapy, Moist heat, Taping, Vasopneumatic device, Ultrasound, Ionotophoresis 72m/ml Dexamethasone, and Manual therapy   PLAN FOR NEXT SESSION: Progress therex as indicated. Assess and modifiy HEP as indicated    ALiberty MutualMS, PT 04/03/21 1:24 PM

## 2021-04-03 ENCOUNTER — Ambulatory Visit: Payer: Medicare Other | Attending: Internal Medicine

## 2021-04-03 ENCOUNTER — Other Ambulatory Visit: Payer: Self-pay

## 2021-04-03 DIAGNOSIS — G8929 Other chronic pain: Secondary | ICD-10-CM | POA: Insufficient documentation

## 2021-04-03 DIAGNOSIS — M6281 Muscle weakness (generalized): Secondary | ICD-10-CM | POA: Diagnosis not present

## 2021-04-03 DIAGNOSIS — M25612 Stiffness of left shoulder, not elsewhere classified: Secondary | ICD-10-CM | POA: Insufficient documentation

## 2021-04-03 DIAGNOSIS — M25512 Pain in left shoulder: Secondary | ICD-10-CM | POA: Insufficient documentation

## 2021-04-10 ENCOUNTER — Other Ambulatory Visit: Payer: Self-pay

## 2021-04-10 ENCOUNTER — Ambulatory Visit: Payer: Medicare Other

## 2021-04-10 DIAGNOSIS — M25512 Pain in left shoulder: Secondary | ICD-10-CM | POA: Diagnosis not present

## 2021-04-10 DIAGNOSIS — M6281 Muscle weakness (generalized): Secondary | ICD-10-CM

## 2021-04-10 DIAGNOSIS — G8929 Other chronic pain: Secondary | ICD-10-CM

## 2021-04-10 DIAGNOSIS — M25612 Stiffness of left shoulder, not elsewhere classified: Secondary | ICD-10-CM | POA: Diagnosis not present

## 2021-04-10 NOTE — Therapy (Signed)
OUTPATIENT PHYSICAL THERAPY TREATMENT NOTE   Patient Name: Victor Ramirez MRN: [id number] DOB:Jul 04, 1955, 66 y.o., male Today's Date: 04/10/2021  PCP: Lois Huxley, [place] REFERRING PROVIDER: Collier Salina, MD   PT End of Session - 04/10/21 1712     Visit Number 7    Number of Visits 13    Date for PT Re-Evaluation 04/25/21    Authorization Type [employer] MEDICARE    Authorization Time Period reassess FOTO on 10th visits    Progress Note Due on Visit 10    PT Start Time 1335    PT Stop Time 1417    PT Time Calculation (min) 42 min    Activity Tolerance Patient limited by pain    Behavior During Therapy [facility] for tasks assessed/performed                  Past Medical History:  Diagnosis Date   Hemochromatosis    Hypertension    Past Surgical History:  Procedure Laterality Date   ANTERIOR LATERAL LUMBAR FUSION WITH PERCUTANEOUS SCREW 1 LEVEL Right 11/27/2016   Procedure: Lumbar Three-Four Transpsoas Lumbar InterbodyFfusion;  Surgeon: Ditty, Kevan Ny, MD;  Location: [place];  Service: Neurosurgery;  Laterality: Right;  L3-4 Transpsoas lumbar interbody fusion/L3-4 Pedicle screw fixation with posterolateral arthrodesis/minimally invasive decompression/Mazor   APPLICATION OF ROBOTIC ASSISTANCE FOR SPINAL PROCEDURE N/A 11/27/2016   Procedure: Lumbar Three-Four Pedicle Screw Fixation with Posterolateral Arthrodesis with Minimally Invasive Decompression with Application of Robotic Assistance;  Surgeon: Ditty, Kevan Ny, MD;  Location: [place];  Service: Neurosurgery;  Laterality: N/A;   BACK SURGERY     x3    BACK SURGERY     battery surgery    spinal manipulation under anesthesia     Patient Active Problem List   Diagnosis Date Noted   RUQ abdominal pain    Acute pancreatitis without infection or necrosis 12/02/2020   Pain in left shoulder 11/28/2020   Rheumatoid factor positive 08/29/2020   Polymyalgia rheumatica ([place]) 08/08/2020   Polyarthralgia 08/08/2020    High risk medication use 08/08/2020   Lumbosacral spondylosis with radiculopathy 11/27/2016   Atrophy of muscle of right lower leg 02/04/2014   History of post-polio syndrome 02/02/2014   Pain of left lower extremity 02/02/2014   Chronic back pain 05/14/2013   HTN (hypertension) 05/14/2013   Headache 12/22/2011   Sleep apnea 11/17/2011   Elevation of level of transaminase or lactic acid dehydrogenase (LDH) 09/18/2010   Anxiety 09/08/2010   Tachycardia 09/08/2010   Hyperlipemia 03/01/2006   TIA (transient ischemic attack) 03/01/2006   Tobacco use disorder 03/01/2006   Depressive disorder 10/16/2005    REFERRING DIAG: Chronic left shoulder pain  THERAPY DIAG:  Chronic left shoulder pain  Decreased ROM of left shoulder  Muscle weakness (generalized)  SUBJECTIVE:                                                                                        SUBJECTIVE STATEMENT: My L shoulder is more sore today after sleeping on it last night. Overall, reports improvement in L shoulder pain and strength.   PERTINENT HISTORY: PMR good days/bad days-  recent Dx; HTN, hemochromatosis, 4 back surgeries   PAIN:  Are you having pain? No VAS scale: 3/10; 4/10 with PT Pain location: Shoulder Pain orientation: Left  PAIN TYPE: sharp Pain description: intermittent  Aggravating factors: Moving it, the pain is wrose when his PMR pain is wrose Relieving factors: Rest   PRECAUTIONS: None   WEIGHT BEARING RESTRICTIONS No   PLOF: Independent   PATIENT GOALS: Pain relief   OBJECTIVE:   *Unless otherwise noted, objective information collected previously*  DIAGNOSTIC FINDINGS:  NA   PATIENT SURVEYS:  FOTO 41% functional status; 65% expected level. 2/2/3: 41%   COGNITION:          Overall cognitive status: Within functional limits for tasks assessed                               SENSATION:          Light touch: Appears intact, intermittent B arms   POSTURE: Forward head, rounded  shoulders, increased thoracic kyphosis, winging scapula   PALPATION: TTP of the post/lat GH area and post. Post. scapula in area of infraspinatus   UPPER EXTREMITY AROM/PROM:   A/PROM Right 03/06/2021 Left 03/06/2021 Right 03/27/2021 Left 03/27/2021 Left 04/03/30  Shoulder flexion 140 125 160 100 130  Shoulder extension         Shoulder abduction         Shoulder adduction         Shoulder internal rotation T11 L5  L5   Shoulder external rotation T4 C7  C7   Elbow flexion         Elbow extension         Wrist flexion         Wrist extension         Wrist ulnar deviation         Wrist radial deviation         Wrist pronation         Wrist supination         (Blank rows = not tested) AROM: L shoulder pain was provoked c flexion with knees buckling due to the pain PROM: L shoulder PROM was greater than AROM and similar with R shoulder PROM. Crepitus was not noted with PROM. Pain was provoked at the endrange of flexion and abduction. UPPER EXTREMITY MMT:   MMT Right 03/06/2021 Left 03/06/2021  Shoulder flexion   4+  Shoulder extension   4+  Shoulder abduction   4+  Shoulder adduction   5  Shoulder ER   4  Shoulder IR   4  Elbow flexion      Elbow extension      Wrist flexion      Wrist extension      Wrist ulnar deviation      Wrist radial deviation      Wrist pronation      Wrist supination      Grip strength (lbs)      (Blank rows = not tested) Pain was provoked with L shoulder ER and IR and pt reporting popping deep in his L GH jt. At times the pr's knees buckled due to the pain.   SHOULDER SPECIAL TESTS: L shoulder           Impingement tests: Hawkins/Kennedy impingement test: positive  and Painful arc test: positive            Instability tests: Posterior drawer  test: negative           Rotator cuff assessment: Empty can test: negative and Full can test: negative; report of pain without overt weakness           Biceps assessment: Yergason's test: negative and Speed's  test: negative   TODAY'S TREATMENT:  [facility] Adult PT Treatment:                                                DATE: 04/09/21 Therapeutic Exercise: UBE level 1 x71mn forward, 2 min backward while collecting subjective information Lt shoulder ER with 3#  in SL was not tolerated with crepitus and pain and was stopped Static shoulder ER with side steps, GTB, 2x8, 5' Wall wash L UE x10 Seated BIL shoulder scaption with 2# dumbbells 2x10 Supine shoulder protraction 4# 2x10 BIL Seated shoulder row 2x15, GTB Seated shoulder ext 2x15, GTB Seated bicep curls 3x10, 8#   [facility] Adult PT Treatment:                                                DATE: 04/03/21 Therapeutic Exercise: UBE level 1 x260m forward, 2 min backward while collecting subjective information BIL shoulder flexion AAROM with physioball at wall 3x30 seconds with PT perturbations Lt shoulder ER with 3# FM was not tolerated, completed 3x10with 3-sec hold Seated BIL shoulder scaption with 2# dumbbells 2x10 Standing reverse chops with RTB 2x10 BIL Seated shoulder row 2x10, GTB Seated shoulder ext 2x10, GTB Seated bicep curls 2x12, 8#  [facility] Adult PT Treatment:                                                DATE: 04/01/2021 Therapeutic Exercise: UBE level 1 x2m47mforward, 2 min backward while collecting subjective information BIL shoulder flexion AAROM with physioball at wall 3x30 seconds with PT perturbations Standing Lt shoulder pendulums 3x30 seconds in-between shoulder flexion AAROM sets Seated BIL shoulder scaption with 2# dumbbells 2x10 Sidelying Lt shoulder ER with 3# dumbbell 3x8 with 3-sec hold Manual Therapy: N/A Neuromuscular re-ed: N/A Therapeutic Activity: N/A Modalities: N/A Self Care: N/A   PATIENT EDUCATION: Verbal and tactile cues with new exercises Person educated: Patient Education method: Explanation, Demonstration, Tactile cues, and Verbal cues Education comprehension: verbalized understanding, returned  demonstration, verbal cues required, and tactile cues required     HOME EXERCISE PROGRAM: N/A   ASSESSMENT:   CLINICAL IMPRESSION:  PT was completed for ROM and strengthening with minimized pain of the L shoulder. Reaching into abd and into flexion above 90d (except with wall assist) increases the pt's L shoulder pain with marked crepitus palpated. Pt continues to make progress re: increased activity level and/or resistance level with his there ex. Pt is making limited, but appropriate progress considering the status of his L shoulder. Pt tolerated today's PT session without adverse effects.   REHAB POTENTIAL: Fair due to chronicity    CLINICAL DECISION MAKING: Stable/uncomplicated   EVALUATION COMPLEXITY: Low     GOALS:   SHORT TERM GOALS:   STG Name Target Date Goal status  1  Pt will ne Ind in an initial HEP Baseline: Pt reports varied adherence to his HEP 03/26/21 Progressing  2 Pt will voice understanding of measures to decrease and manage L shoulder pain  Baseline:  03/26/21 Met 04/03/21    LONG TERM GOALS:    LTG Name Target Date Goal status  1 Increase L shoulder strength by 1/2 muscle grade for all movements to improve functional use of the L UE Baseline: 04/25/21 INITIAL  2 Pt will report a decrease in L shoulder pain with daily activities to 4/10 or less for improve function and QOL. Baseline:0-10/10 04/25/21 Progressing  3 Pt's FOTO score will increase to the the predicted value of 65% as a measure of decreased pain and improved function. Baseline: 41% 04/25/21 INITIAL  4 Pt will be Ind in a final HEP to maintain the achieved LOF Baseline: Pt reports varied adherence to his HEP 04/25/21 Progressing    PLAN: PT FREQUENCY: 2x/week   PT DURATION: 6 weeks   PLANNED INTERVENTIONS: Therapeutic exercises, Therapeutic activity, Patient/Family education, Joint mobilization, Dry Needling, Electrical stimulation, Cryotherapy, Moist heat, Taping, Vasopneumatic device,  Ultrasound, Ionotophoresis 81m/ml Dexamethasone, and Manual therapy   PLAN FOR NEXT SESSION: Progress therex as indicated. Assess and modifiy HEP as indicated    [employer]MS, PT 04/10/21 5:22 PM

## 2021-04-12 NOTE — Therapy (Signed)
OUTPATIENT PHYSICAL THERAPY TREATMENT NOTE   Patient Name: Victor Ramirez MRN: [id number] DOB:[birth year], 66 y.o., male Today's Date: 04/15/2021  PCP: Lois Huxley, [place] REFERRING PROVIDER: Lois Huxley, [place]   PT End of Session - 04/15/21 1056     Visit Number 8    Number of Visits 13    Date for PT Re-Evaluation 04/25/21    Authorization Type [employer] MEDICARE    Authorization Time Period reassess FOTO on 10th visits    Progress Note Due on Visit 10    PT Start Time 1052   pt arrived 7 minutes late to his appointment.   PT Stop Time 1130    PT Time Calculation (min) 38 min    Activity Tolerance Patient limited by pain    Behavior During Therapy WFL for tasks assessed/performed                   Past Medical History:  Diagnosis Date   Hemochromatosis    Hypertension    Past Surgical History:  Procedure Laterality Date   ANTERIOR LATERAL LUMBAR FUSION WITH PERCUTANEOUS SCREW 1 LEVEL Right 11/27/2016   Procedure: Lumbar Three-Four Transpsoas Lumbar InterbodyFfusion;  Surgeon: Ditty, Kevan Ny, MD;  Location: [facility];  Service: Neurosurgery;  Laterality: Right;  L3-4 Transpsoas lumbar interbody fusion/L3-4 Pedicle screw fixation with posterolateral arthrodesis/minimally invasive decompression/Mazor   APPLICATION OF ROBOTIC ASSISTANCE FOR SPINAL PROCEDURE N/A 11/27/2016   Procedure: Lumbar Three-Four Pedicle Screw Fixation with Posterolateral Arthrodesis with Minimally Invasive Decompression with Application of Robotic Assistance;  Surgeon: Ditty, Kevan Ny, MD;  Location: [facility];  Service: Neurosurgery;  Laterality: N/A;   BACK SURGERY     x3    BACK SURGERY     battery surgery    spinal manipulation under anesthesia     Patient Active Problem List   Diagnosis Date Noted   RUQ abdominal pain    Acute pancreatitis without infection or necrosis 12/02/2020   Pain in left shoulder 11/28/2020   Rheumatoid factor positive 08/29/2020   Polymyalgia rheumatica ([place])  08/08/2020   Polyarthralgia 08/08/2020   High risk medication use 08/08/2020   Lumbosacral spondylosis with radiculopathy 11/27/2016   Atrophy of muscle of right lower leg 02/04/2014   History of post-polio syndrome 02/02/2014   Pain of left lower extremity 02/02/2014   Chronic back pain 05/14/2013   HTN (hypertension) 05/14/2013   Headache 12/22/2011   Sleep apnea 11/17/2011   Elevation of level of transaminase or lactic acid dehydrogenase (LDH) 09/18/2010   Anxiety 09/08/2010   Tachycardia 09/08/2010   Hyperlipemia 03/01/2006   TIA (transient ischemic attack) 03/01/2006   Tobacco use disorder 03/01/2006   Depressive disorder 10/16/2005    REFERRING DIAG: Chronic left shoulder pain  THERAPY DIAG:  Chronic left shoulder pain  Decreased ROM of left shoulder  Muscle weakness (generalized)  SUBJECTIVE:                                                                                        SUBJECTIVE STATEMENT: Pt reports that he has been doing well this morning, reporting his pain is around a 1-2/10. He adds that  he has been chasing his new puppy around recently. He also reports that he had some residual pain after last session, but this didn't last long.   PERTINENT HISTORY: PMR good days/bad days- recent Dx; HTN, hemochromatosis, 4 back surgeries   PAIN:  Are you having pain? No VAS scale: 1-2/10 Pain location: Shoulder Pain orientation: Left  PAIN TYPE: sharp Pain description: intermittent  Aggravating factors: Moving it, the pain is wrose when his PMR pain is wrose Relieving factors: Rest   PRECAUTIONS: None   WEIGHT BEARING RESTRICTIONS No   PLOF: Independent   PATIENT GOALS: Pain relief   OBJECTIVE:   *Unless otherwise noted, objective information collected previously*  DIAGNOSTIC FINDINGS:  NA   PATIENT SURVEYS:  FOTO 41% functional status; 65% expected level. 2/2/3: 41%   COGNITION:          Overall cognitive status: Within functional limits for  tasks assessed                               SENSATION:          Light touch: Appears intact, intermittent B arms   POSTURE: Forward head, rounded shoulders, increased thoracic kyphosis, winging scapula   PALPATION: TTP of the post/lat GH area and post. Post. scapula in area of infraspinatus   UPPER EXTREMITY AROM/PROM:   A/PROM Right 03/06/2021 Left 03/06/2021 Right 03/27/2021 Left 03/27/2021 Left 04/03/30  Shoulder flexion 140 125 160 100 130  Shoulder extension         Shoulder abduction         Shoulder adduction         Shoulder internal rotation T11 L5  L5   Shoulder external rotation T4 C7  C7   Elbow flexion         Elbow extension         Wrist flexion         Wrist extension         Wrist ulnar deviation         Wrist radial deviation         Wrist pronation         Wrist supination         (Blank rows = not tested) AROM: L shoulder pain was provoked c flexion with knees buckling due to the pain PROM: L shoulder PROM was greater than AROM and similar with R shoulder PROM. Crepitus was not noted with PROM. Pain was provoked at the endrange of flexion and abduction. UPPER EXTREMITY MMT:   MMT Right 03/06/2021 Left 03/06/2021 Left 04/15/2021  Shoulder flexion   4+ 4/5 p!  Shoulder extension   4+ 5/5  Shoulder abduction   4+ 5/5  Shoulder adduction   5 5/5  Shoulder ER   4 4+/5p!  Shoulder IR   4 4/5p!!  Elbow flexion       Elbow extension       Wrist flexion       Wrist extension       Wrist ulnar deviation       Wrist radial deviation       Wrist pronation       Wrist supination       Grip strength (lbs)       (Blank rows = not tested) Pain was provoked with L shoulder ER and IR and pt reporting popping deep in his L GH jt. At times the pr's knees  buckled due to the pain.   SHOULDER SPECIAL TESTS: L shoulder           Impingement tests: Hawkins/Kennedy impingement test: positive  and Painful arc test: positive            Instability tests: Posterior drawer  test: negative           Rotator cuff assessment: Empty can test: negative and Full can test: negative; report of pain without overt weakness           Biceps assessment: Yergason's test: negative and Speed's test: negative   TODAY'S TREATMENT:   [facility] Adult PT Treatment:                                                DATE: 04/15/2021 Therapeutic Exercise: UBE level 1 x40mn forward, 2 min backward while collecting subjective information Lt shoulder ER walkout with RTB with 5-sec hold at end range 2x8 Lt shoulder IR walkout with RTB with 5-sec hold at end range 2x8 Seated low rows with 25# cable 2x10 with 3-sec hold Seated high rows with 25# cable 2x10 with 3-sec hold Seated BIL shoulder scaption with 2# dumbbells 3x10 Manual Therapy: N/A Neuromuscular re-ed: N/A Therapeutic Activity: N/A Modalities: N/A Self Care: N/A   [facility] Adult PT Treatment:                                                DATE: 04/09/21 Therapeutic Exercise: UBE level 1 x255m forward, 2 min backward while collecting subjective information Lt shoulder ER with 3#  in SL was not tolerated with crepitus and pain and was stopped Static shoulder ER with side steps, GTB, 2x8, 5' Wall wash L UE x10 Seated BIL shoulder scaption with 2# dumbbells 2x10 Supine shoulder protraction 4# 2x10 BIL Seated shoulder row 2x15, GTB Seated shoulder ext 2x15, GTB Seated bicep curls 3x10, 8#   [facility] Adult PT Treatment:                                                DATE: 04/03/21 Therapeutic Exercise: UBE level 1 x2m12mforward, 2 min backward while collecting subjective information BIL shoulder flexion AAROM with physioball at wall 3x30 seconds with PT perturbations Lt shoulder ER with 3# FM was not tolerated, completed 3x10with 3-sec hold Seated BIL shoulder scaption with 2# dumbbells 2x10 Standing reverse chops with RTB 2x10 BIL Seated shoulder row 2x10, GTB Seated shoulder ext 2x10, GTB Seated bicep curls 2x12, 8#    PATIENT  EDUCATION: Verbal and tactile cues with new exercises Person educated: Patient Education method: Explanation, Demonstration, Tactile cues, and Verbal cues Education comprehension: verbalized understanding, returned demonstration, verbal cues required, and tactile cues required     HOME EXERCISE PROGRAM: N/A   ASSESSMENT:   CLINICAL IMPRESSION:  Pt arrived 7 minutes late to his appointment, which led to a slightly truncated treatment session today. Upon reassessment of his Lt shoulder strength, he has made improvements in shoulder abduction and ER strength, although he remains limited by pain into IR and flexion. He tolerated all exercises well today, demonstrating  good form and mild increase in pain. He will continue to benefit from skilled PT to address his primary impairments and return to his prior level of function with less limitation.   REHAB POTENTIAL: Fair due to chronicity    CLINICAL DECISION MAKING: Stable/uncomplicated   EVALUATION COMPLEXITY: Low     GOALS:   SHORT TERM GOALS:   STG Name Target Date Goal status  1 Pt will ne Ind in an initial HEP Baseline: Pt reports varied adherence to his HEP 03/26/21 Progressing  2 Pt will voice understanding of measures to decrease and manage L shoulder pain  Baseline:  03/26/21 Met 04/03/21    LONG TERM GOALS:    LTG Name Target Date Goal status  1 Increase L shoulder strength by 1/2 muscle grade for all movements to improve functional use of the L UE Baseline: See MMT chart 04/15/2021: See updated MMT chart 04/25/21 Progressing  2 Pt will report a decrease in L shoulder pain with daily activities to 4/10 or less for improve function and QOL. Baseline:0-10/10 04/25/21 Progressing  3 Pt's FOTO score will increase to the the predicted value of 65% as a measure of decreased pain and improved function. Baseline: 41% 04/03/2021: 41% 04/25/21 Progressing  4 Pt will be Ind in a final HEP to maintain the achieved LOF Baseline: Pt reports  varied adherence to his HEP 04/25/21 Progressing    PLAN: PT FREQUENCY: 2x/week   PT DURATION: 6 weeks   PLANNED INTERVENTIONS: Therapeutic exercises, Therapeutic activity, Patient/Family education, Joint mobilization, Dry Needling, Electrical stimulation, Cryotherapy, Moist heat, Taping, Vasopneumatic device, Ultrasound, Ionotophoresis 57m/ml Dexamethasone, and Manual therapy   PLAN FOR NEXT SESSION: Progress therex as indicated. Assess and modifiy HEP as indicated    YVanessa [place] PT, DPT 04/15/21 11:29 AM

## 2021-04-15 ENCOUNTER — Other Ambulatory Visit: Payer: Self-pay

## 2021-04-15 ENCOUNTER — Ambulatory Visit: Payer: Medicare Other

## 2021-04-15 DIAGNOSIS — G8929 Other chronic pain: Secondary | ICD-10-CM | POA: Diagnosis not present

## 2021-04-15 DIAGNOSIS — M6281 Muscle weakness (generalized): Secondary | ICD-10-CM | POA: Diagnosis not present

## 2021-04-15 DIAGNOSIS — M25612 Stiffness of left shoulder, not elsewhere classified: Secondary | ICD-10-CM

## 2021-04-15 DIAGNOSIS — M25512 Pain in left shoulder: Secondary | ICD-10-CM | POA: Diagnosis not present

## 2021-04-16 NOTE — Therapy (Signed)
OUTPATIENT PHYSICAL THERAPY TREATMENT NOTE   Patient Name: Victor Ramirez MRN: [id number] DOB:January 10, 1956, 66 y.o., male Today's Date: 04/17/2021  PCP: Lois Huxley, [place] REFERRING PROVIDER: Collier [place], MD   PT End of Session - 04/17/21 1057     Visit Number 9    Number of Visits 13    Date for PT Re-Evaluation 04/25/21    Authorization Type [employer] MEDICARE    Authorization Time Period reassess FOTO on 10th visits    Progress Note Due on Visit 10    PT Start Time 1055   Pt arrived 10 minutes late to his appointment.   PT Stop Time 1130    PT Time Calculation (min) 35 min    Activity Tolerance Patient limited by pain    Behavior During Therapy WFL for tasks assessed/performed                    Past Medical History:  Diagnosis Date   Hemochromatosis    Hypertension    Past Surgical History:  Procedure Laterality Date   ANTERIOR LATERAL LUMBAR FUSION WITH PERCUTANEOUS SCREW 1 LEVEL Right 11/27/2016   Procedure: Lumbar Three-Four Transpsoas Lumbar InterbodyFfusion;  Surgeon: Ditty, Kevan Ny, MD;  Location: [facility];  Service: Neurosurgery;  Laterality: Right;  L3-4 Transpsoas lumbar interbody fusion/L3-4 Pedicle screw fixation with posterolateral arthrodesis/minimally invasive decompression/Mazor   APPLICATION OF ROBOTIC ASSISTANCE FOR SPINAL PROCEDURE N/A 11/27/2016   Procedure: Lumbar Three-Four Pedicle Screw Fixation with Posterolateral Arthrodesis with Minimally Invasive Decompression with Application of Robotic Assistance;  Surgeon: Ditty, Kevan Ny, MD;  Location: [facility];  Service: Neurosurgery;  Laterality: N/A;   BACK SURGERY     x3    BACK SURGERY     battery surgery    spinal manipulation under anesthesia     Patient Active Problem List   Diagnosis Date Noted   RUQ abdominal pain    Acute pancreatitis without infection or necrosis 12/02/2020   Pain in left shoulder 11/28/2020   Rheumatoid factor positive 08/29/2020   Polymyalgia  rheumatica ([place]) 08/08/2020   Polyarthralgia 08/08/2020   High risk medication use 08/08/2020   Lumbosacral spondylosis with radiculopathy 11/27/2016   Atrophy of muscle of right lower leg 02/04/2014   History of post-polio syndrome 02/02/2014   Pain of left lower extremity 02/02/2014   Chronic back pain 05/14/2013   HTN (hypertension) 05/14/2013   Headache 12/22/2011   Sleep apnea 11/17/2011   Elevation of level of transaminase or lactic acid dehydrogenase (LDH) 09/18/2010   Anxiety 09/08/2010   Tachycardia 09/08/2010   Hyperlipemia 03/01/2006   TIA (transient ischemic attack) 03/01/2006   Tobacco use disorder 03/01/2006   Depressive disorder 10/16/2005    REFERRING DIAG: Chronic left shoulder pain  THERAPY DIAG:  Chronic left shoulder pain  Decreased ROM of left shoulder  Muscle weakness (generalized)  SUBJECTIVE:                                                                                        SUBJECTIVE STATEMENT: Pt reports 1/10 pain today, adding that he can tell his shoulders are getting stronger. He also reports daily  adherence to his HEP.   PERTINENT HISTORY: PMR good days/bad days- recent Dx; HTN, hemochromatosis, 4 back surgeries   PAIN:  Are you having pain? No VAS scale: 1/10 Pain location: Shoulder Pain orientation: Left  PAIN TYPE: sharp Pain description: intermittent  Aggravating factors: Moving it, the pain is wrose when his PMR pain is wrose Relieving factors: Rest   PRECAUTIONS: None   WEIGHT BEARING RESTRICTIONS No   PLOF: Independent   PATIENT GOALS: Pain relief   OBJECTIVE:   *Unless otherwise noted, objective information collected previously*  DIAGNOSTIC FINDINGS:  NA   PATIENT SURVEYS:  FOTO 41% functional status; 65% expected level. 2/2/3: 41%   COGNITION:          Overall cognitive status: Within functional limits for tasks assessed                               SENSATION:          Light touch: Appears intact,  intermittent B arms   POSTURE: Forward head, rounded shoulders, increased thoracic kyphosis, winging scapula   PALPATION: TTP of the post/lat GH area and post. Post. scapula in area of infraspinatus   UPPER EXTREMITY AROM/PROM:   A/PROM Right 03/06/2021 Left 03/06/2021 Right 03/27/2021 Left 03/27/2021 Left 04/03/30  Shoulder flexion 140 125 160 100 130  Shoulder extension         Shoulder abduction         Shoulder adduction         Shoulder internal rotation T11 L5  L5   Shoulder external rotation T4 C7  C7   Elbow flexion         Elbow extension         Wrist flexion         Wrist extension         Wrist ulnar deviation         Wrist radial deviation         Wrist pronation         Wrist supination         (Blank rows = not tested) AROM: L shoulder pain was provoked c flexion with knees buckling due to the pain PROM: L shoulder PROM was greater than AROM and similar with R shoulder PROM. Crepitus was not noted with PROM. Pain was provoked at the endrange of flexion and abduction. UPPER EXTREMITY MMT:   MMT Right 03/06/2021 Left 03/06/2021 Left 04/15/2021  Shoulder flexion   4+ 4/5 p!  Shoulder extension   4+ 5/5  Shoulder abduction   4+ 5/5  Shoulder adduction   5 5/5  Shoulder ER   4 4+/5p!  Shoulder IR   4 4/5p!!  Elbow flexion       Elbow extension       Wrist flexion       Wrist extension       Wrist ulnar deviation       Wrist radial deviation       Wrist pronation       Wrist supination       Grip strength (lbs)       (Blank rows = not tested) Pain was provoked with L shoulder ER and IR and pt reporting popping deep in his L GH jt. At times the pr's knees buckled due to the pain.   SHOULDER SPECIAL TESTS: L shoulder  Impingement tests: Hawkins/Kennedy impingement test: positive  and Painful arc test: positive            Instability tests: Posterior drawer test: negative           Rotator cuff assessment: Empty can test: negative and Full can test:  negative; report of pain without overt weakness           Biceps assessment: Yergason's test: negative and Speed's test: negative   TODAY'S TREATMENT:   [facility] Adult PT Treatment:                                                DATE: 04/17/2021 Therapeutic Exercise: UBE level 1 x10mn forward, 2 min backward while collecting subjective information Seated low rows with 30# cable 2x10 with 3-sec hold Seated high rows with 30# cable 2x10 with 3-sec hold Seated lat pull-down with 30# cable 2x10 with 3-sec hold Seated forward and backward shoulder rolls 2x10 each Lt shoulder ER walkout with RTB with concentric contraction at end range 2x8 Lt shoulder IR walkout with RTB with concentric contraction at end range 2x8 Seated Lt shoulder PNF D2 shoulder flexion 3x10 Manual Therapy: N/A Neuromuscular re-ed: N/A Therapeutic Activity: N/A Modalities: N/A Self Care: N/A   [facility] Adult PT Treatment:                                                DATE: 04/15/2021 Therapeutic Exercise: UBE level 1 x281m forward, 2 min backward while collecting subjective information Lt shoulder ER walkout with RTB with 5-sec hold at end range 2x8 Lt shoulder IR walkout with RTB with 5-sec hold at end range 2x8 Seated low rows with 25# cable 2x10 with 3-sec hold Seated high rows with 25# cable 2x10 with 3-sec hold Seated BIL shoulder scaption with 2# dumbbells 3x10 Manual Therapy: N/A Neuromuscular re-ed: N/A Therapeutic Activity: N/A Modalities: N/A Self Care: N/A   [facility] Adult PT Treatment:                                                DATE: 04/09/21 Therapeutic Exercise: UBE level 1 x2m81mforward, 2 min backward while collecting subjective information Lt shoulder ER with 3#  in SL was not tolerated with crepitus and pain and was stopped Static shoulder ER with side steps, GTB, 2x8, 5' Wall wash L UE x10 Seated BIL shoulder scaption with 2# dumbbells 2x10 Supine shoulder protraction 4# 2x10 BIL Seated  shoulder row 2x15, GTB Seated shoulder ext 2x15, GTB Seated bicep curls 3x10, 8#     PATIENT EDUCATION: Verbal and tactile cues with new exercises Person educated: Patient Education method: Explanation, Demonstration, Tactile cues, and Verbal cues Education comprehension: verbalized understanding, returned demonstration, verbal cues required, and tactile cues required     HOME EXERCISE PROGRAM: N/A   ASSESSMENT:   CLINICAL IMPRESSION:  Pt arrived 10 minutes late to his appointment, which led to a truncated treatment session. He responded well to all interventions today, demonstrating good form and minor increase in pain with selected exercises. He will continue to benefit from skilled PT to  address his primary impairments and return to his prior level of function with less limitation.   REHAB POTENTIAL: Fair due to chronicity    CLINICAL DECISION MAKING: Stable/uncomplicated   EVALUATION COMPLEXITY: Low     GOALS:   SHORT TERM GOALS:   STG Name Target Date Goal status  1 Pt will ne Ind in an initial HEP Baseline: Pt reports varied adherence to his HEP 03/26/21 Progressing  2 Pt will voice understanding of measures to decrease and manage L shoulder pain  Baseline:  03/26/21 Met 04/03/21    LONG TERM GOALS:    LTG Name Target Date Goal status  1 Increase L shoulder strength by 1/2 muscle grade for all movements to improve functional use of the L UE Baseline: See MMT chart 04/15/2021: See updated MMT chart 04/25/21 Progressing  2 Pt will report a decrease in L shoulder pain with daily activities to 4/10 or less for improve function and QOL. Baseline:0-10/10 04/25/21 Progressing  3 Pt's FOTO score will increase to the the predicted value of 65% as a measure of decreased pain and improved function. Baseline: 41% 04/03/2021: 41% 04/25/21 Progressing  4 Pt will be Ind in a final HEP to maintain the achieved LOF Baseline: Pt reports varied adherence to his HEP 04/25/21 Progressing     PLAN: PT FREQUENCY: 2x/week   PT DURATION: 6 weeks   PLANNED INTERVENTIONS: Therapeutic exercises, Therapeutic activity, Patient/Family education, Joint mobilization, Dry Needling, Electrical stimulation, Cryotherapy, Moist heat, Taping, Vasopneumatic device, Ultrasound, Ionotophoresis 79m/ml Dexamethasone, and Manual therapy   PLAN FOR NEXT SESSION: Progress therex as indicated. Assess and modifiy HEP as indicated    YVanessa [place] PT, DPT 04/17/21 11:26 AM

## 2021-04-17 ENCOUNTER — Ambulatory Visit: Payer: Medicare Other

## 2021-04-17 ENCOUNTER — Other Ambulatory Visit: Payer: Self-pay

## 2021-04-17 DIAGNOSIS — M6281 Muscle weakness (generalized): Secondary | ICD-10-CM | POA: Diagnosis not present

## 2021-04-17 DIAGNOSIS — M25612 Stiffness of left shoulder, not elsewhere classified: Secondary | ICD-10-CM | POA: Diagnosis not present

## 2021-04-17 DIAGNOSIS — G8929 Other chronic pain: Secondary | ICD-10-CM | POA: Diagnosis not present

## 2021-04-17 DIAGNOSIS — M25512 Pain in left shoulder: Secondary | ICD-10-CM

## 2021-04-19 NOTE — Therapy (Signed)
OUTPATIENT PHYSICAL THERAPY TREATMENT NOTE  Progress Note Reporting Period 03/05/2021 to 04/22/2021  See note below for Objective Data and Assessment of Progress/Goals.      Patient Name: Victor Ramirez MRN: [id number] DOB:Nov 21, 1955, 66 y.o., male Today's Date: 04/22/2021  PCP: Lois Huxley, [place] REFERRING PROVIDER: Lois Huxley, [place]   PT End of Session - 04/22/21 1009     Visit Number 10    Number of Visits 13    Date for PT Re-Evaluation 04/25/21    Authorization Type [employer] MEDICARE    Authorization Time Period reassess FOTO on 10th visits    Progress Note Due on Visit 10    PT Start Time 1007   Pt arrived 7 minutes late to his appointment.   PT Stop Time 1045    PT Time Calculation (min) 38 min    Activity Tolerance Patient limited by pain    Behavior During Therapy WFL for tasks assessed/performed                     Past Medical History:  Diagnosis Date   Hemochromatosis    Hypertension    Past Surgical History:  Procedure Laterality Date   ANTERIOR LATERAL LUMBAR FUSION WITH PERCUTANEOUS SCREW 1 LEVEL Right 11/27/2016   Procedure: Lumbar Three-Four Transpsoas Lumbar InterbodyFfusion;  Surgeon: Ditty, Kevan Ny, MD;  Location: [place];  Service: Neurosurgery;  Laterality: Right;  L3-4 Transpsoas lumbar interbody fusion/L3-4 Pedicle screw fixation with posterolateral arthrodesis/minimally invasive decompression/Mazor   APPLICATION OF ROBOTIC ASSISTANCE FOR SPINAL PROCEDURE N/A 11/27/2016   Procedure: Lumbar Three-Four Pedicle Screw Fixation with Posterolateral Arthrodesis with Minimally Invasive Decompression with Application of Robotic Assistance;  Surgeon: Ditty, Kevan Ny, MD;  Location: [place];  Service: Neurosurgery;  Laterality: N/A;   BACK SURGERY     x3    BACK SURGERY     battery surgery    spinal manipulation under anesthesia     Patient Active Problem List   Diagnosis Date Noted   RUQ abdominal pain    Acute pancreatitis without  infection or necrosis 12/02/2020   Pain in left shoulder 11/28/2020   Rheumatoid factor positive 08/29/2020   Polymyalgia rheumatica ([place]) 08/08/2020   Polyarthralgia 08/08/2020   High risk medication use 08/08/2020   Lumbosacral spondylosis with radiculopathy 11/27/2016   Atrophy of muscle of right lower leg 02/04/2014   History of post-polio syndrome 02/02/2014   Pain of left lower extremity 02/02/2014   Chronic back pain 05/14/2013   HTN (hypertension) 05/14/2013   Headache 12/22/2011   Sleep apnea 11/17/2011   Elevation of level of transaminase or lactic acid dehydrogenase (LDH) 09/18/2010   Anxiety 09/08/2010   Tachycardia 09/08/2010   Hyperlipemia 03/01/2006   TIA (transient ischemic attack) 03/01/2006   Tobacco use disorder 03/01/2006   Depressive disorder 10/16/2005    REFERRING DIAG: Chronic left shoulder pain  THERAPY DIAG:  Chronic left shoulder pain  Decreased ROM of left shoulder  Muscle weakness (generalized)  SUBJECTIVE:  SUBJECTIVE STATEMENT: Pt reports 3/10 pain today, adding that he has been adherent to his HEP.   PERTINENT HISTORY: PMR good days/bad days- recent Dx; HTN, hemochromatosis, 4 back surgeries   PAIN:  Are you having pain? No VAS scale: 3/10 Pain location: Shoulder Pain orientation: Left  PAIN TYPE: sharp Pain description: intermittent  Aggravating factors: Moving it, the pain is wrose when his PMR pain is wrose Relieving factors: Rest   PRECAUTIONS: None   WEIGHT BEARING RESTRICTIONS No   PLOF: Independent   PATIENT GOALS: Pain relief   OBJECTIVE:   *Unless otherwise noted, objective information collected previously*  DIAGNOSTIC FINDINGS:  NA   PATIENT SURVEYS:  FOTO 41% functional status; 65% expected level. 2/2/3: 41% 04/22/2021: 47%   COGNITION:          Overall cognitive status: Within functional limits for tasks assessed                                SENSATION:          Light touch: Appears intact, intermittent B arms   POSTURE: Forward head, rounded shoulders, increased thoracic kyphosis, winging scapula   PALPATION: TTP of the post/lat GH area and post. Post. scapula in area of infraspinatus   UPPER EXTREMITY AROM/PROM:   A/PROM Right 03/06/2021 Left 03/06/2021 Right 03/27/2021 Left 03/27/2021 Left 04/03/30 Left 04/22/2021  Shoulder flexion 140 125 160 100 130 165 with high pain and jerking movements throughout range  Shoulder extension          Shoulder abduction          Shoulder adduction          Shoulder internal rotation T11 L5  L5  L4  Shoulder external rotation T4 C7  C7  T2  Elbow flexion          Elbow extension          Wrist flexion          Wrist extension          Wrist ulnar deviation          Wrist radial deviation          Wrist pronation          Wrist supination          (Blank rows = not tested) AROM: L shoulder pain was provoked c flexion with knees buckling due to the pain PROM: L shoulder PROM was greater than AROM and similar with R shoulder PROM. Crepitus was not noted with PROM. Pain was provoked at the endrange of flexion and abduction. UPPER EXTREMITY MMT:   MMT Right 03/06/2021 Left 03/06/2021 Left 04/15/2021  Shoulder flexion   4+ 4/5 p!  Shoulder extension   4+ 5/5  Shoulder abduction   4+ 5/5  Shoulder adduction   5 5/5  Shoulder ER   4 4+/5p!  Shoulder IR   4 4/5p!!  Elbow flexion       Elbow extension       Wrist flexion       Wrist extension       Wrist ulnar deviation       Wrist radial deviation       Wrist pronation       Wrist supination       Grip strength (lbs)       (Blank rows = not tested) Pain was provoked with L shoulder ER and  IR and pt reporting popping deep in his L GH jt. At times the pr's knees buckled due to the pain.   SHOULDER SPECIAL TESTS: L shoulder           Impingement tests: Hawkins/Kennedy impingement test: positive  and Painful arc  test: positive            Instability tests: Posterior drawer test: negative           Rotator cuff assessment: Empty can test: negative and Full can test: negative; report of pain without overt weakness           Biceps assessment: Yergason's test: negative and Speed's test: negative   TODAY'S TREATMENT:   [facility] Adult PT Treatment:                                                DATE: 04/22/2021 Therapeutic Exercise: UBE level 1 x88mn forward, 2 min backward while collecting subjective information Standing reverse chops with 3# cable 2x10 BIL Standing mini arm circles by side 3x20 backward, 3x20 forward Standing Lt shoulder ER with 3# cable 3x8 Standing Lt shoulder IR with 3# cable 3x8 Standing BIL shoulder flexion AAROM with ball at wall with PT perturbations at end range 3x30sec Standing trunk rotation while holding black physioball 2x10 BIL Manual Therapy: N/A Neuromuscular re-ed: N/A Therapeutic Activity: N/A Modalities: N/A Self Care: N/A   [facility] Adult PT Treatment:                                                DATE: 04/17/2021 Therapeutic Exercise: UBE level 1 x26m forward, 2 min backward while collecting subjective information Seated low rows with 30# cable 2x10 with 3-sec hold Seated high rows with 30# cable 2x10 with 3-sec hold Seated lat pull-down with 30# cable 2x10 with 3-sec hold Seated forward and backward shoulder rolls 2x10 each Lt shoulder ER walkout with RTB with concentric contraction at end range 2x8 Lt shoulder IR walkout with RTB with concentric contraction at end range 2x8 Seated Lt shoulder PNF D2 shoulder flexion 3x10 Manual Therapy: N/A Neuromuscular re-ed: N/A Therapeutic Activity: N/A Modalities: N/A Self Care: N/A   [facility] Adult PT Treatment:                                                DATE: 04/15/2021 Therapeutic Exercise: UBE level 1 x2m56mforward, 2 min backward while collecting subjective information Lt shoulder ER walkout with RTB  with 5-sec hold at end range 2x8 Lt shoulder IR walkout with RTB with 5-sec hold at end range 2x8 Seated low rows with 25# cable 2x10 with 3-sec hold Seated high rows with 25# cable 2x10 with 3-sec hold Seated BIL shoulder scaption with 2# dumbbells 3x10 Manual Therapy: N/A Neuromuscular re-ed: N/A Therapeutic Activity: N/A Modalities: N/A Self Care: N/A      PATIENT EDUCATION: Verbal and tactile cues with new exercises Person educated: Patient Education method: Explanation, Demonstration, Tactile cues, and Verbal cues Education comprehension: verbalized understanding, returned demonstration, verbal cues required, and tactile cues required     HOME EXERCISE PROGRAM:  N/A   ASSESSMENT:   CLINICAL IMPRESSION:  Upon re-assessment, the pt has made good progress in Lt shoulder flexion, IR, and ER AROM, although these movements are still painful. To date, the pt has made progress in his AROM, strength, and FOTO score, although he continues to have high levels of pain with Lt shoulder movements. He tolerated treatment well today, although he has increase in pain with resisted Lt shoulder ER. He will continue to benefit from skilled PT to address his primary impairments and return to his prior level of function with less limitation.   REHAB POTENTIAL: Fair due to chronicity    CLINICAL DECISION MAKING: Stable/uncomplicated   EVALUATION COMPLEXITY: Low     GOALS:   SHORT TERM GOALS:   STG Name Target Date Goal status  1 Pt will ne Ind in an initial HEP Baseline: Pt reports varied adherence to his HEP 03/26/21 Progressing  2 Pt will voice understanding of measures to decrease and manage L shoulder pain  Baseline:  03/26/21 Met 04/03/21    LONG TERM GOALS:    LTG Name Target Date Goal status  1 Increase L shoulder strength by 1/2 muscle grade for all movements to improve functional use of the L UE Baseline: See MMT chart 04/15/2021: See updated MMT chart 04/25/21 Progressing   2 Pt will report a decrease in L shoulder pain with daily activities to 4/10 or less for improve function and QOL. Baseline:0-10/10 04/25/21 Progressing  3 Pt's FOTO score will increase to the the predicted value of 65% as a measure of decreased pain and improved function. Baseline: 41% 04/03/2021: 41% 04/25/21 Progressing  4 Pt will be Ind in a final HEP to maintain the achieved LOF Baseline: Pt reports varied adherence to his HEP 04/25/21 Progressing    PLAN: PT FREQUENCY: 2x/week   PT DURATION: 6 weeks   PLANNED INTERVENTIONS: Therapeutic exercises, Therapeutic activity, Patient/Family education, Joint mobilization, Dry Needling, Electrical stimulation, Cryotherapy, Moist heat, Taping, Vasopneumatic device, Ultrasound, Ionotophoresis 37m/ml Dexamethasone, and Manual therapy   PLAN FOR NEXT SESSION: Progress therex as indicated. Assess and modifiy HEP as indicated    YVanessa [place] PT, DPT 04/22/21 10:40 AM

## 2021-04-22 ENCOUNTER — Other Ambulatory Visit: Payer: Self-pay

## 2021-04-22 ENCOUNTER — Ambulatory Visit: Payer: Medicare Other

## 2021-04-22 DIAGNOSIS — M25512 Pain in left shoulder: Secondary | ICD-10-CM | POA: Diagnosis not present

## 2021-04-22 DIAGNOSIS — M6281 Muscle weakness (generalized): Secondary | ICD-10-CM

## 2021-04-22 DIAGNOSIS — M25612 Stiffness of left shoulder, not elsewhere classified: Secondary | ICD-10-CM | POA: Diagnosis not present

## 2021-04-22 DIAGNOSIS — G8929 Other chronic pain: Secondary | ICD-10-CM | POA: Diagnosis not present

## 2021-04-23 NOTE — Therapy (Addendum)
OUTPATIENT PHYSICAL THERAPY TREATMENT NOTE/ DISCHARGE SUMMARY     Patient Name: Victor Ramirez MRN: [id number] DOB:11/17/1955, 66 y.o., male Today's Date: 04/24/2021  PCP: Lois Huxley, [place] REFERRING PROVIDER: Collier Salina, MD   PT End of Session - 04/24/21 1012     Visit Number 11    Number of Visits 13    Date for PT Re-Evaluation 04/25/21    Authorization Type [employer] MEDICARE    Authorization Time Period reassess FOTO on 10th visits    PT Start Time 1007   Pt arrived 7 minutes late to his appointment.   PT Stop Time 1045    PT Time Calculation (min) 38 min    Activity Tolerance Patient limited by pain    Behavior During Therapy WFL for tasks assessed/performed                      Past Medical History:  Diagnosis Date   Hemochromatosis    Hypertension    Past Surgical History:  Procedure Laterality Date   ANTERIOR LATERAL LUMBAR FUSION WITH PERCUTANEOUS SCREW 1 LEVEL Right 11/27/2016   Procedure: Lumbar Three-Four Transpsoas Lumbar InterbodyFfusion;  Surgeon: Ditty, Kevan Ny, MD;  Location: [place];  Service: Neurosurgery;  Laterality: Right;  L3-4 Transpsoas lumbar interbody fusion/L3-4 Pedicle screw fixation with posterolateral arthrodesis/minimally invasive decompression/Mazor   APPLICATION OF ROBOTIC ASSISTANCE FOR SPINAL PROCEDURE N/A 11/27/2016   Procedure: Lumbar Three-Four Pedicle Screw Fixation with Posterolateral Arthrodesis with Minimally Invasive Decompression with Application of Robotic Assistance;  Surgeon: Ditty, Kevan Ny, MD;  Location: [place];  Service: Neurosurgery;  Laterality: N/A;   BACK SURGERY     x3    BACK SURGERY     battery surgery    spinal manipulation under anesthesia     Patient Active Problem List   Diagnosis Date Noted   RUQ abdominal pain    Acute pancreatitis without infection or necrosis 12/02/2020   Pain in left shoulder 11/28/2020   Rheumatoid factor positive 08/29/2020   Polymyalgia rheumatica ([place])  08/08/2020   Polyarthralgia 08/08/2020   High risk medication use 08/08/2020   Lumbosacral spondylosis with radiculopathy 11/27/2016   Atrophy of muscle of right lower leg 02/04/2014   History of post-polio syndrome 02/02/2014   Pain of left lower extremity 02/02/2014   Chronic back pain 05/14/2013   HTN (hypertension) 05/14/2013   Headache 12/22/2011   Sleep apnea 11/17/2011   Elevation of level of transaminase or lactic acid dehydrogenase (LDH) 09/18/2010   Anxiety 09/08/2010   Tachycardia 09/08/2010   Hyperlipemia 03/01/2006   TIA (transient ischemic attack) 03/01/2006   Tobacco use disorder 03/01/2006   Depressive disorder 10/16/2005    REFERRING DIAG: Chronic left shoulder pain  THERAPY DIAG:  Chronic left shoulder pain  Decreased ROM of left shoulder  Muscle weakness (generalized)  SUBJECTIVE:                                                                                        SUBJECTIVE STATEMENT: Pt reports 4/10 pain today after trying to do yard work yesterday. He adds that he has been doing his HEP daily.  Pt agrees that he has reached a plateau in therapy and would like to be discharged to seek other treatment alternatives, including imaging of his shoulder.   PERTINENT HISTORY: PMR good days/bad days- recent Dx; HTN, hemochromatosis, 4 back surgeries   PAIN:  Are you having pain? No VAS scale: 4/10 Pain location: Shoulder Pain orientation: Left  PAIN TYPE: sharp Pain description: intermittent  Aggravating factors: Moving it, the pain is wrose when his PMR pain is wrose Relieving factors: Rest   PRECAUTIONS: None   WEIGHT BEARING RESTRICTIONS No   PLOF: Independent   PATIENT GOALS: Pain relief   OBJECTIVE:   *Unless otherwise noted, objective information collected previously*  DIAGNOSTIC FINDINGS:  NA   PATIENT SURVEYS:  FOTO 41% functional status; 65% expected level. 2/2/3: 41% 04/22/2021: 47%   COGNITION:          Overall cognitive status:  Within functional limits for tasks assessed                               SENSATION:          Light touch: Appears intact, intermittent B arms   POSTURE: Forward head, rounded shoulders, increased thoracic kyphosis, winging scapula   PALPATION: TTP of the post/lat GH area and post. Post. scapula in area of infraspinatus   UPPER EXTREMITY AROM/PROM:   A/PROM Right 03/06/2021 Left 03/06/2021 Right 03/27/2021 Left 03/27/2021 Left 04/03/30 Left 04/22/2021  Shoulder flexion 140 125 160 100 130 165 with high pain and jerking movements throughout range  Shoulder extension          Shoulder abduction          Shoulder adduction          Shoulder internal rotation T11 L5  L5  L4  Shoulder external rotation T4 C7  C7  T2  Elbow flexion          Elbow extension          Wrist flexion          Wrist extension          Wrist ulnar deviation          Wrist radial deviation          Wrist pronation          Wrist supination          (Blank rows = not tested) AROM: L shoulder pain was provoked c flexion with knees buckling due to the pain PROM: L shoulder PROM was greater than AROM and similar with R shoulder PROM. Crepitus was not noted with PROM. Pain was provoked at the endrange of flexion and abduction. UPPER EXTREMITY MMT:   MMT Right 03/06/2021 Left 03/06/2021 Left 04/15/2021  Shoulder flexion   4+ 4/5 p!  Shoulder extension   4+ 5/5  Shoulder abduction   4+ 5/5  Shoulder adduction   5 5/5  Shoulder ER   4 4+/5p!  Shoulder IR   4 4/5p!!  Elbow flexion       Elbow extension       Wrist flexion       Wrist extension       Wrist ulnar deviation       Wrist radial deviation       Wrist pronation       Wrist supination       Grip strength (lbs)       (Blank rows =  not tested) Pain was provoked with L shoulder ER and IR and pt reporting popping deep in his L GH jt. At times the pr's knees buckled due to the pain.   SHOULDER SPECIAL TESTS: L shoulder           Impingement tests:  Hawkins/Kennedy impingement test: positive  and Painful arc test: positive            Instability tests: Posterior drawer test: negative           Rotator cuff assessment: Empty can test: negative and Full can test: negative; report of pain without overt weakness           Biceps assessment: Yergason's test: negative and Speed's test: negative   04/24/21: Hawkins-Kennedy (+), Neer's (+), Crank test (+), O'Brien's (+), Biceps load I and II (+)   TODAY'S TREATMENT:   [facility] Adult PT Treatment:                                                DATE: 04/24/2021 Therapeutic Exercise: Standing BIL shoulder flexion AAROM with ball at wall with PT perturbations at end range 3x30sec Standing BIL shoulder scaption with black physioball 2x10 Standing Lt shoulder ER with 3# cable 3x10 Manual Therapy: N/A Neuromuscular re-ed: N/A Therapeutic Activity: N/A Modalities: N/A Self Care: N/A   [facility] Adult PT Treatment:                                                DATE: 04/22/2021 Therapeutic Exercise: UBE level 1 x13mn forward, 2 min backward while collecting subjective information Standing reverse chops with 3# cable 2x10 BIL Standing mini arm circles by side 3x20 backward, 3x20 forward Standing Lt shoulder ER with 3# cable 3x8 Standing Lt shoulder IR with 3# cable 3x8 Standing BIL shoulder flexion AAROM with ball at wall with PT perturbations at end range 3x30sec Standing trunk rotation while holding black physioball 2x10 BIL Manual Therapy: N/A Neuromuscular re-ed: N/A Therapeutic Activity: N/A Modalities: N/A Self Care: N/A   [facility] Adult PT Treatment:                                                DATE: 04/17/2021 Therapeutic Exercise: UBE level 1 x236m forward, 2 min backward while collecting subjective information Seated low rows with 30# cable 2x10 with 3-sec hold Seated high rows with 30# cable 2x10 with 3-sec hold Seated lat pull-down with 30# cable 2x10 with 3-sec hold Seated  forward and backward shoulder rolls 2x10 each Lt shoulder ER walkout with RTB with concentric contraction at end range 2x8 Lt shoulder IR walkout with RTB with concentric contraction at end range 2x8 Seated Lt shoulder PNF D2 shoulder flexion 3x10 Manual Therapy: N/A Neuromuscular re-ed: N/A Therapeutic Activity: Pt educated on POC moving forward, including PT recommendation for Lt shoulder MRI to rule out labral pathology Modalities: N/A Self Care: N/A       PATIENT EDUCATION: Verbal and tactile cues with new exercises Person educated: Patient Education method: Explanation, Demonstration, Tactile cues, and Verbal cues Education comprehension: verbalized understanding, returned demonstration, verbal cues  required, and tactile cues required     HOME EXERCISE PROGRAM: N/A   ASSESSMENT:   CLINICAL IMPRESSION: Pt has reached a plateau in progress with PT. He has made some good strength and ROM gains, but continues to be limited by pain with functional movements, particularly shoulder movements involving rotation. High concern for labral pathology due to painful clicking/ popping with shoulder rotation, positive O'Brien's testing, positive crank testing, and positive Biceps load I and II testing. Recommend the pt pursue MRI of Lt shoulder to rule out labral pathology that may not experience good results with conservative rehab. The pt responded well to exercises performed today, although he is still unable to perform shoulder ER in 90/90 due to pain. He is discharged at this time due to reaching a plateau in therapy.    REHAB POTENTIAL: Fair due to chronicity    CLINICAL DECISION MAKING: Stable/uncomplicated   EVALUATION COMPLEXITY: Low     GOALS:   SHORT TERM GOALS:   STG Name Target Date Goal status  1 Pt will ne Ind in an initial HEP Baseline: Pt reports varied adherence to his HEP 04/24/2021: Pt reports daily adherence to his HEP 03/26/21 Met  2 Pt will voice  understanding of measures to decrease and manage L shoulder pain  Baseline:  03/26/21 Met 04/03/21    LONG TERM GOALS:    LTG Name Target Date Goal status  1 Increase L shoulder strength by 1/2 muscle grade for all movements to improve functional use of the L UE Baseline: See MMT chart 04/15/2021: See updated MMT chart 04/25/21 Partially Met  2 Pt will report a decrease in L shoulder pain with daily activities to 4/10 or less for improve function and QOL. Baseline:0-10/10 04/24/2021: 0-10/10 04/25/21 Not Met  3 Pt's FOTO score will increase to the the predicted value of 65% as a measure of decreased pain and improved function. Baseline: 41% 04/03/2021: 41% 04/25/21 Not Met  4 Pt will be Ind in a final HEP to maintain the achieved LOF Baseline: Pt reports varied adherence to his HEP 04/24/2021: Pt reports adherence to updated HEP 04/25/21 Met    PLAN: PT FREQUENCY: 2x/week   PT DURATION: 6 weeks   PLANNED INTERVENTIONS: Therapeutic exercises, Therapeutic activity, Patient/Family education, Joint mobilization, Dry Needling, Electrical stimulation, Cryotherapy, Moist heat, Taping, Vasopneumatic device, Ultrasound, Ionotophoresis 56m/ml Dexamethasone, and Manual therapy   PLAN FOR NEXT SESSION: Pt is discharged from PT at this time.    YVanessa [place] PT, DPT 04/24/21 10:41 AM  PHYSICAL THERAPY DISCHARGE SUMMARY  Visits from Start of Care: 11  Current functional level related to goals / functional outcomes: Pt have made progress in Lt shoulder strength and AROM since starting PT.   Remaining deficits: Pt continues to have high levels of pain that is exacerbated by overhead and rotatory shoulder movements.   Education / Equipment: HEP   Patient agrees to discharge. Patient goals were not met. Patient is being discharged due to lack of progress.

## 2021-04-24 ENCOUNTER — Other Ambulatory Visit: Payer: Self-pay | Admitting: Internal Medicine

## 2021-04-24 ENCOUNTER — Ambulatory Visit: Payer: Medicare Other

## 2021-04-24 ENCOUNTER — Other Ambulatory Visit: Payer: Self-pay

## 2021-04-24 DIAGNOSIS — M6281 Muscle weakness (generalized): Secondary | ICD-10-CM | POA: Diagnosis not present

## 2021-04-24 DIAGNOSIS — M25612 Stiffness of left shoulder, not elsewhere classified: Secondary | ICD-10-CM

## 2021-04-24 DIAGNOSIS — G8929 Other chronic pain: Secondary | ICD-10-CM

## 2021-04-24 DIAGNOSIS — M353 Polymyalgia rheumatica: Secondary | ICD-10-CM

## 2021-04-24 DIAGNOSIS — M25512 Pain in left shoulder: Secondary | ICD-10-CM

## 2021-04-25 ENCOUNTER — Telehealth: Payer: Self-pay | Admitting: Internal Medicine

## 2021-04-25 DIAGNOSIS — G8929 Other chronic pain: Secondary | ICD-10-CM

## 2021-04-25 DIAGNOSIS — M25512 Pain in left shoulder: Secondary | ICD-10-CM

## 2021-04-25 NOTE — Telephone Encounter (Signed)
Patient called the office stating he has finished his physical therapy and his physical therapist has sent Dr. Benjamine Mola an urgent e-mail that they would like him to see.

## 2021-04-25 NOTE — Telephone Encounter (Signed)
I spoke with Victor Ramirez therapist is concerned for shoulder arthritis causing pain that is not amenable to additional time and strengthening exercise. I agree and he probably would benefit with procedure or surgery. Probably will need eventual MRI but will go ahead and refer to orthopedic surgery for this problem.

## 2021-05-06 ENCOUNTER — Ambulatory Visit: Payer: Medicare Other | Admitting: Internal Medicine

## 2021-05-11 NOTE — Progress Notes (Signed)
Office Visit Note  Patient: Victor Ramirez             Date of Birth: May 27, 1955           MRN: [id number]             PCP: Lois Huxley, PA Referring: Lois Huxley, [place] Visit Date: 05/12/2021   Subjective:  Follow-up (PMR, not doing good)   History of Present Illness: Victor Ramirez is a 66 y.o. male here for follow up for PMR with multiple joint pains on low dose prednisone 5 mg daily. He completed a series of physical therapy for chronic joint pains especially shoulders but concerned about advanced structural problems possibly to need further imaging or procedures. He continues taking the prednisone 5 mg daily without major complications but is having worse symptoms. Right wrist pain at the ulnar side is frequent and right hand numbness intermittently particularly when driving or first thing in the morning. He has numerous forearm scratches and bleeding areas from a new puppy. The left shoulder hurt worse with PT sessions although his ROM improved very slightly. He has an appointment with Dr. Marlou Sa later today about the shoulder which has failed to improved with steroid injections, NSAIDs, prednisone, and PT.  Previous HPI 01/29/21 Victor Ramirez is a 66 y.o. male here for follow up for PMR on prednisone 7.5 mg daily. Since our last visit he was at the hospital last month with abdominal pain workup thought to represent pancreatitis. Symptoms are overall doing better in most areas except right hand, left shoulder, and neck. Left shoulder steroid injection last visit helped for about a month but then had worsening again. His right hand is hurting more in the wrist and hand going numb intermittently. Especially notices this during prolonged driving, and improves keeping wrist in neutral position.   Previous HPI: 08/08/20 Victor Ramirez is a 66 y.o. male referred for PMR. His symptoms started abruptly with severe pain in bilateral shoulders limiting use and mobility especially first thing  in the morning.  He does not require any preceding injury or changes in health or medications. For evaluation and primary care clinic with x-ray imaging obtained that did not show any structural cause of symptoms he had markedly high inflammatory markers and was started on oral prednisone for suspected PMR.  He noticed dramatic improvement in symptoms within 2 days of starting the medicine.  At follow-up he was recommended to decrease the dose down to 15 mg of prednisone at which time he noticed a return of his symptoms.  He increased back to 20 mg and feels this is doing well again.  The most affected areas were in the base of his neck and bilateral upper back and shoulders with radiation down the upper portion of the arms.  He also felt like it was affecting his right hip and thigh much worse than left, where he has chronic deficits due to history of polio.  He has started to notice some weight gain with the oral prednisone otherwise no specific side effect problems.     Review of Systems  Constitutional:  Positive for fatigue.  HENT:  Negative for mouth dryness.   Eyes:  Negative for dryness.  Respiratory:  Negative for shortness of breath.   Cardiovascular:  Negative for swelling in legs/feet.  Gastrointestinal:  Negative for constipation.  Endocrine: Negative for excessive thirst.  Genitourinary:  Negative for difficulty urinating.  Musculoskeletal:  Positive for joint pain, gait  problem, joint pain, muscle weakness, morning stiffness and muscle tenderness.  Skin:  Negative for rash.  Allergic/Immunologic: Negative for susceptible to infections.  Neurological:  Positive for numbness and weakness.  Hematological:  Positive for bruising/bleeding tendency.  Psychiatric/Behavioral:  Positive for sleep disturbance.    PMFS History:  Patient Active Problem List   Diagnosis Date Noted   Pain in right wrist 05/12/2021   RUQ abdominal pain    Acute pancreatitis without infection or necrosis  12/02/2020   Pain in left shoulder 11/28/2020   Rheumatoid factor positive 08/29/2020   Polymyalgia rheumatica ([place]) 08/08/2020   Polyarthralgia 08/08/2020   High risk medication use 08/08/2020   Lumbosacral spondylosis with radiculopathy 11/27/2016   Atrophy of muscle of right lower leg 02/04/2014   History of post-polio syndrome 02/02/2014   Pain of left lower extremity 02/02/2014   Chronic back pain 05/14/2013   HTN (hypertension) 05/14/2013   Headache 12/22/2011   Sleep apnea 11/17/2011   Elevation of level of transaminase or lactic acid dehydrogenase (LDH) 09/18/2010   Anxiety 09/08/2010   Tachycardia 09/08/2010   Hyperlipemia 03/01/2006   TIA (transient ischemic attack) 03/01/2006   Tobacco use disorder 03/01/2006   Depressive disorder 10/16/2005    Past Medical History:  Diagnosis Date   Hemochromatosis    Hypertension     Family History  Problem Relation Age of Onset   Arthritis Mother    Thyroid cancer Mother    Melanoma Father    Fibromyalgia Sister    Cancer Brother    Prostate cancer Brother    Williams syndrome Son    Past Surgical History:  Procedure Laterality Date   ANTERIOR LATERAL LUMBAR FUSION WITH PERCUTANEOUS SCREW 1 LEVEL Right 11/27/2016   Procedure: Lumbar Three-Four Transpsoas Lumbar InterbodyFfusion;  Surgeon: Ditty, Kevan Ny, MD;  Location: [facility];  Service: Neurosurgery;  Laterality: Right;  L3-4 Transpsoas lumbar interbody fusion/L3-4 Pedicle screw fixation with posterolateral arthrodesis/minimally invasive decompression/Mazor   APPLICATION OF ROBOTIC ASSISTANCE FOR SPINAL PROCEDURE N/A 11/27/2016   Procedure: Lumbar Three-Four Pedicle Screw Fixation with Posterolateral Arthrodesis with Minimally Invasive Decompression with Application of Robotic Assistance;  Surgeon: Ditty, Kevan Ny, MD;  Location: [facility];  Service: Neurosurgery;  Laterality: N/A;   BACK SURGERY     x3    BACK SURGERY     battery surgery    spinal manipulation under  anesthesia     Social History   Social History Narrative   Not on file   Immunization History  Administered Date(s) Administered   PFIZER(Purple Top)SARS-COV-2 Vaccination 06/01/2019, 07/01/2019     Objective: Vital Signs: BP (!) 146/87 (BP Location: Left Arm, Patient Position: Sitting, Cuff Size: Normal)    Pulse 79    Resp 16    Ht 5\' 8"  (1.727 m)    Wt 193 lb (87.5 kg)    BMI 29.35 kg/m    Physical Exam Eyes:     Conjunctiva/sclera: Conjunctivae normal.  Cardiovascular:     Rate and Rhythm: Normal rate and regular rhythm.  Pulmonary:     Effort: Pulmonary effort is normal.     Breath sounds: Normal breath sounds.     Comments: Mild upper airway congestion Skin:    General: Skin is warm and dry.  Neurological:     Mental Status: He is alert.     Comments: Right proximal leg weaker than right, diminished reflex  Psychiatric:        Mood and Affect: Mood normal.  Musculoskeletal Exam:  Neck full ROM no tenderness Left shoulder abduction ROM slightly restricted, some pain with active abduction overhead and with internal rotation to reach behind back Elbows full ROM no tenderness or swelling Wrists full ROM right wrist tenderness to pressure on dorsal side, limited ultrasound exam showing swelling around extensor carpi ulnaris tendon at area of maximal tenderness Fingers full ROM no tenderness or swelling Knees full ROM no tenderness or swelling Ankles full ROM no tenderness or swelling   Investigation: No additional findings.  Imaging: No results found.  Recent Labs: Lab Results  Component Value Date   WBC 14.9 (H) 12/03/2020   HGB 13.9 12/03/2020   PLT 256 12/03/2020   NA 134 (L) 12/03/2020   K 4.0 12/03/2020   CL 100 12/03/2020   CO2 22 12/03/2020   GLUCOSE 77 12/03/2020   BUN 11 12/03/2020   CREATININE 0.81 12/03/2020   BILITOT 1.0 12/03/2020   ALKPHOS 71 12/03/2020   AST 33 12/03/2020   ALT 46 (H) 12/03/2020   PROT 7.0 12/03/2020   ALBUMIN 3.6  12/03/2020   CALCIUM 8.9 12/03/2020   GFRAA 111 08/08/2020    Speciality Comments: No specialty comments available.  Procedures:  No procedures performed Allergies: Bee venom   Assessment / Plan:     Visit Diagnoses: Polymyalgia rheumatica ([place]) - Plan: Sedimentation rate, CK, Rheumatoid factor  Most symptoms consistent with some ongoing inflammation related to PMR while staying on 5 mg prednisone dose.  Right wrist inflammation is somewhat atypical but could also be just overuse or injury related since he is favoring this side so heavily due to shoulder problems.  Checking sed rate for inflammation activity monitoring.  Plan to continue 5 mg prednisone daily at this time. He has follow-up with Dr. Marlou Sa this afternoon for the ongoing left shoulder problems he is already failed to improve adequately with extensive conservative treatments I think he will be needing MRI imaging and probably some procedure based on findings.  Atrophy of muscle of right lower leg  Worsening weakness and stiffness in the right leg not sure how much this is change compared to previous since he has chronic deficits with longstanding postpolio myopathy.  Checking CK level for any evidence of new muscle inflammation or loss.  Rheumatoid factor positive  Still think new onset rheumatoid arthritis is less likely with just the right wrist for peripheral involvement.  Rechecking rheumatoid factor.  Pain in right wrist  Looks consistent with right extensor carpi ulnaris tendinitis discussed treatment with supportive brace resting this as possible using cold therapy as needed.  Orders: Orders Placed This Encounter  Procedures   Sedimentation rate   CK   Rheumatoid factor   No orders of the defined types were placed in this encounter.    Follow-Up Instructions: Return in about 3 months (around 08/12/2021) for PMR on prednisone f/u 33mos.   Collier Salina, MD  Note - This record has been created using  [employer].  Chart creation errors have been sought, but may not always  have been located. Such creation errors do not reflect on  the standard of medical care.

## 2021-05-12 ENCOUNTER — Other Ambulatory Visit: Payer: Self-pay

## 2021-05-12 ENCOUNTER — Ambulatory Visit: Payer: Medicare Other | Admitting: Orthopedic Surgery

## 2021-05-12 ENCOUNTER — Ambulatory Visit (INDEPENDENT_AMBULATORY_CARE_PROVIDER_SITE_OTHER): Payer: Medicare Other

## 2021-05-12 ENCOUNTER — Encounter: Payer: Self-pay | Admitting: Internal Medicine

## 2021-05-12 ENCOUNTER — Ambulatory Visit: Payer: Medicare Other | Admitting: Internal Medicine

## 2021-05-12 VITALS — BP 146/87 | HR 79 | Resp 16 | Ht 68.0 in | Wt 193.0 lb

## 2021-05-12 DIAGNOSIS — M79602 Pain in left arm: Secondary | ICD-10-CM

## 2021-05-12 DIAGNOSIS — M62561 Muscle wasting and atrophy, not elsewhere classified, right lower leg: Secondary | ICD-10-CM

## 2021-05-12 DIAGNOSIS — M25531 Pain in right wrist: Secondary | ICD-10-CM | POA: Diagnosis not present

## 2021-05-12 DIAGNOSIS — M353 Polymyalgia rheumatica: Secondary | ICD-10-CM

## 2021-05-12 DIAGNOSIS — R768 Other specified abnormal immunological findings in serum: Secondary | ICD-10-CM | POA: Diagnosis not present

## 2021-05-12 MED ORDER — DIAZEPAM 10 MG PO TABS: ORAL_TABLET | ORAL | 0 refills | Status: DC

## 2021-05-12 NOTE — Patient Instructions (Signed)
I suspect the right hand problems are related to tendonitis. This can be seen associated with problems like RA but is most commonly use related. ? ?I am repeating inflammatory markers, this time including muscle enzyme test and also repeating the rheumatoid factor test that was mildly elevated last year. ? ?Will contact you after results reviewed, plan to continue the current prednisone 5 mg daily for now. ?

## 2021-05-13 ENCOUNTER — Encounter: Payer: Self-pay | Admitting: Orthopedic Surgery

## 2021-05-13 LAB — SEDIMENTATION RATE: Sed Rate: 22 mm/h — ABNORMAL HIGH (ref 0–20)

## 2021-05-13 LAB — RHEUMATOID FACTOR: Rheumatoid fact SerPl-aCnc: 41 IU/mL — ABNORMAL HIGH (ref <14)

## 2021-05-13 LAB — CK: Total CK: 50 U/L (ref 44–196)

## 2021-05-13 NOTE — Progress Notes (Signed)
? ?Office Visit Note ?  ?Patient: Victor Ramirez           ?Date of Birth: 1956/01/03           ?MRN: [id number] ?Visit Date: 05/12/2021 ?Requested by: Fuller Planice, Christopher W, MD ?[address] ?[address] ?[place],  [place] [id number] ?PCP: Wilfrid LundBecker, Anna G, PA ? ?Subjective: ?Chief Complaint  ?Patient presents with  ? Other  ?   ?Left arm pain ?  ? ? ?HPI: Victor Ramirez is a 66 year old patient with PMR who is disabled from multiple back surgeries who presents with fairly incapacitating left shoulder pain.  He is right-hand dominant.  Reports a constant ache but has sharp severe pain with any activity particularly overhead activity.  The pain wakes him from sleep at night.  No prior shoulder surgery but he does describe doing some working out 6 years ago where he felt a pop.  Since then his shoulder function and pain has worsened significantly.  He also describes neck pain with radicular arm pain extending below the elbow.  He has tried physical therapy as well as shoulder injection without relief.  He reports pain as well as clicking and weakness.  Pain radiates from the neck to the shoulder.  Patient also describes a discrete history of bilateral lower extremity paresthesias when he turns to the left head.  Last time that happened was about 6 weeks ago.  Takes Norco and Neurontin for his symptoms.  He has been tapered down to 5 mg of steroid per day for his PMR. ?             ?ROS: All systems reviewed are negative as they relate to the chief complaint within the history of present illness.  Patient denies  fevers or chills. ? ? ?Assessment & Plan: ?Visit Diagnoses:  ?1. Left arm pain   ? ? ?Plan: Impression is left shoulder arthritis with probable radicular component in the cervical spine which could be cervical spine stenosis.  Symptoms ongoing for longer than 8 weeks with failure of conservative management measures.  I think the real question regarding his shoulder is whether or not his rotator cuff is intact.  That would  really guide what operative therapy we pursue.  Regarding the neck he does have radicular symptoms as well as symptoms concerning for spinal stenosis of the cervical spine.  No physical exam evidence of myelopathy at this time.  Plan MRI arthrogram of the left shoulder to evaluate for rotator cuff tear and osteoarthritis with MRI cervical spine to evaluate for stenosis and left-sided radiculopathy.  Prescribed Valium as well for claustrophobia.  Scanners ideally would be open MRI. ? ?Follow-Up Instructions: Return for after MRI.  ? ?Orders:  ?Orders Placed This Encounter  ?Procedures  ? XR Shoulder Left  ? XR Cervical Spine 2 or 3 views  ? MR Shoulder Left w/ contrast  ? Arthrogram  ? MR Cervical Spine w/o contrast  ? ?Meds ordered this encounter  ?Medications  ? diazepam (VALIUM) 10 MG tablet  ?  Sig: 1 po 1 hour prior to MRI procedure. Repeat if needed.  ?  Dispense:  2 tablet  ?  Refill:  0  ? ? ? ? Procedures: ?No procedures performed ? ? ?Clinical Data: ?No additional findings. ? ?Objective: ?Vital Signs: There were no vitals taken for this visit. ? ?Physical Exam:  ? ?Constitutional: Patient appears well-developed ?HEENT:  ?Head: Normocephalic ?Eyes:EOM are normal ?Neck: Normal range of motion ?Cardiovascular: Normal rate ?Pulmonary/chest: Effort  normal ?Neurologic: Patient is alert ?Skin: Skin is warm ?Psychiatric: Patient has normal mood and affect ? ? ?Ortho Exam: Ortho exam demonstrates extension about 10 degrees flexion chin to chest.  Rotation is about 35 degrees bilaterally.  5 out of 5 grip EPL FPL interosseous wrist flexion extension bicep triceps and deltoid strength.  Has pretty reasonable subscap strength on the left and right.  Infraspinatus and supraspinatus strength slightly weaker on the left compared to the right.  Does have a lot of pain around 80 degrees of abduction on that left-hand side.  No Popeye deformity.  No discrete AC joint tenderness on the left.  Negative Babinski negative clonus  with symmetric reflexes upper and lower extremities.  Negative Hoffmann's.  Radial pulse intact bilaterally.  Passive range of motion of that left shoulder is 35/80/130 with pain. ? ?Specialty Comments:  ?No specialty comments available. ? ?Imaging: ?No results found. ? ? ?PMFS History: ?Patient Active Problem List  ? Diagnosis Date Noted  ? Pain in right wrist 05/12/2021  ? RUQ abdominal pain   ? Acute pancreatitis without infection or necrosis 12/02/2020  ? Pain in left shoulder 11/28/2020  ? Rheumatoid factor positive 08/29/2020  ? Polymyalgia rheumatica ([facility]) 08/08/2020  ? Polyarthralgia 08/08/2020  ? High risk medication use 08/08/2020  ? Lumbosacral spondylosis with radiculopathy 11/27/2016  ? Atrophy of muscle of right lower leg 02/04/2014  ? History of post-polio syndrome 02/02/2014  ? Pain of left lower extremity 02/02/2014  ? Chronic back pain 05/14/2013  ? HTN (hypertension) 05/14/2013  ? Headache 12/22/2011  ? Sleep apnea 11/17/2011  ? Elevation of level of transaminase or lactic acid dehydrogenase (LDH) 09/18/2010  ? Anxiety 09/08/2010  ? Tachycardia 09/08/2010  ? Hyperlipemia 03/01/2006  ? TIA (transient ischemic attack) 03/01/2006  ? Tobacco use disorder 03/01/2006  ? Depressive disorder 10/16/2005  ? ?Past Medical History:  ?Diagnosis Date  ? Hemochromatosis   ? Hypertension   ?  ?Family History  ?Problem Relation Age of Onset  ? Arthritis Mother   ? Thyroid cancer Mother   ? Melanoma Father   ? Fibromyalgia Sister   ? Cancer Brother   ? Prostate cancer Brother   ? Williams syndrome Son   ?  ?Past Surgical History:  ?Procedure Laterality Date  ? ANTERIOR LATERAL LUMBAR FUSION WITH PERCUTANEOUS SCREW 1 LEVEL Right 11/27/2016  ? Procedure: Lumbar Three-Four Transpsoas Lumbar InterbodyFfusion;  Surgeon: Ditty, Loura Halt, MD;  Location: [facility] OR;  Service: Neurosurgery;  Laterality: Right;  L3-4 Transpsoas lumbar interbody fusion/L3-4 Pedicle screw fixation with posterolateral arthrodesis/minimally  invasive decompression/Mazor  ? APPLICATION OF ROBOTIC ASSISTANCE FOR SPINAL PROCEDURE N/A 11/27/2016  ? Procedure: Lumbar Three-Four Pedicle Screw Fixation with Posterolateral Arthrodesis with Minimally Invasive Decompression with Application of Robotic Assistance;  Surgeon: Ditty, Loura Halt, MD;  Location: [facility];  Service: Neurosurgery;  Laterality: N/A;  ? BACK SURGERY    ? x3   ? BACK SURGERY    ? battery surgery   ? spinal manipulation under anesthesia    ? ?Social History  ? ?Occupational History  ? Not on file  ?Tobacco Use  ? Smoking status: Former  ?  Packs/day: 2.00  ?  Years: 35.00  ?  Pack years: 70.00  ?  Types: Cigarettes  ?  Quit date: 2007  ?  Years since quitting: 16.2  ? Smokeless tobacco: Never  ?Vaping Use  ? Vaping Use: Never used  ?Substance and Sexual Activity  ? Alcohol use: Yes  ?  Alcohol/week: 9.0 standard drinks  ?  Types: 9 Cans of beer per week  ? Drug use: No  ? Sexual activity: Not on file  ? ? ? ? ? ?

## 2021-05-13 NOTE — Progress Notes (Signed)
Sed rate remains the same as before and his CK is normal, no evidence of muscle inflammation. The rheumatoid factor test is slightly higher than last year I still was not seeing evidence of RA on exam. ?I don't think we need to change treatment right now I hope he makes progress with the wrist and shoulder pain on current plan we will follow up.

## 2021-06-02 ENCOUNTER — Ambulatory Visit
Admission: RE | Admit: 2021-06-02 | Discharge: 2021-06-02 | Disposition: A | Discharge status: Home / Self Care | Payer: Medicare Other | Source: Ambulatory Visit | Attending: Orthopedic Surgery | Admitting: Orthopedic Surgery

## 2021-06-02 ENCOUNTER — Other Ambulatory Visit: Payer: Medicare Other

## 2021-06-02 ENCOUNTER — Inpatient Hospital Stay: Admission: RE | Admit: 2021-06-02 | Payer: Medicare Other | Source: Ambulatory Visit

## 2021-06-02 DIAGNOSIS — M79602 Pain in left arm: Secondary | ICD-10-CM

## 2021-06-02 DIAGNOSIS — M25512 Pain in left shoulder: Secondary | ICD-10-CM | POA: Diagnosis not present

## 2021-06-02 DIAGNOSIS — M2578 Osteophyte, vertebrae: Secondary | ICD-10-CM | POA: Diagnosis not present

## 2021-06-02 DIAGNOSIS — S46012A Strain of muscle(s) and tendon(s) of the rotator cuff of left shoulder, initial encounter: Secondary | ICD-10-CM | POA: Diagnosis not present

## 2021-06-02 MED ORDER — IOPAMIDOL (ISOVUE-M 200) INJECTION 41%
12.0000 mL | Freq: Once | INTRAMUSCULAR | Status: AC
Start: 2021-06-02 — End: 2021-06-02
  Administered 2021-06-02: 12 mL via INTRA_ARTICULAR

## 2021-06-07 ENCOUNTER — Other Ambulatory Visit: Payer: Medicare Other

## 2021-06-09 ENCOUNTER — Other Ambulatory Visit: Payer: Medicare Other

## 2021-06-12 ENCOUNTER — Ambulatory Visit: Payer: Medicare Other | Admitting: Orthopedic Surgery

## 2021-06-12 ENCOUNTER — Ambulatory Visit (INDEPENDENT_AMBULATORY_CARE_PROVIDER_SITE_OTHER): Payer: Medicare Other | Admitting: Orthopedic Surgery

## 2021-06-12 DIAGNOSIS — M79602 Pain in left arm: Secondary | ICD-10-CM | POA: Diagnosis not present

## 2021-06-12 NOTE — Progress Notes (Signed)
? ?Office Visit Note ?  ?Patient: Victor Ramirez           ?Date of Birth: 1955/04/22           ?MRN: [id number] ?Visit Date: 06/12/2021 ?Requested by: Victor Ramirez ?[address] [address] ?PCP: Victor Ramirez ? ?Subjective: ?Chief Complaint  ?Patient presents with  ? Other  ?   ?Scan review ?Left shoulder and c-spine  ? ? ?HPI: Victor Ramirez is a 66 year old patient with left shoulder pain.  Since he was last seen he has had a left shoulder MRI arthrogram as well as C-spine MRI scan.  Pain radiates from the shoulder but not below the elbow.  Just reports pain.  Not much in the way of numbness and tingling.  He has been on Norco for his back pain for several years.  He does take steroids for his PMR.  5 mg a day.  Reasonably low demand on the left shoulder.  Left shoulder injection previously only gave him 1 to 2 days of relief.  MRI scan of the cervical spine shows multilevel moderate neuroforaminal narrowing at several levels.  No significant high-grade spinal canal stenosis at any level.  MRI of the shoulder on the left-hand side demonstrates tendinopathy of the supraspinatus with high-grade partial-thickness articular surface tearing around the footprint.  This is also the case for the infraspinatus and supraspinatus.  Intrasubstance tearing of the long head of the biceps is present along with moderate glenohumeral osteoarthritis with significant cartilage loss on both the glenoid and humeral head side. ?             ?ROS: All systems reviewed are negative as they relate to the chief complaint within the history of present illness.  Patient denies  fevers or chills. ? ? ?Assessment & Plan: ?Visit Diagnoses:  ?1. Left arm pain   ? ? ?Plan: Impression is severe left shoulder pain with significant arthritis as well as tendinopathy involving the posterior superior rotator cuff.  They have his best chance for pain relief as well as some functional improvement is shoulder replacement.  Based  on his steroid use and relatively low demands he is putting on the shoulder as well as the presence of significant partial-thickness tearing of the rotator cuff posteriorly superiorly I think a reverse replacement is his best option.  We can do this with bone preserving implants on the glenoid side.  Concern with total shoulder replacement would be the integrity of the rotator cuff and possibility of revision within 3 to 5 years after primary which in his case being on chronic steroid use and based on the appearance of his skin today would not be ideal.  Essentially aiming for 1 surgery to take care of the problem with minimal chance that requiring revision surgery.  The risk and benefits of reverse replacement are discussed with the patient with the use of models.  They include but not limited to infection nerve vessel damage incomplete restoration of function as well as incomplete pain relief as well as dislocation.  Patient understands risk benefits and wishes to proceed.  Thin cut CT scan pending for patient-specific instrumentation to optimize implant position and improve longevity.  Plan to do this in early June.  All questions answered ? ?ollow-Up Instructions: No follow-ups on file.  ? ?Orders:  ?Orders Placed This Encounter  ?Procedures  ? CT SHOULDER LEFT WO CONTRAST  ? ?No orders of the defined types  were placed in this encounter. ? ? ? ? Procedures: ?No procedures performed ? ? ?Clinical Data: ?No additional findings. ? ?Objective: ?Vital Signs: There were no vitals taken for this visit. ? ?Physical Exam:  ? ?Constitutional: Patient appears well-developed ?HEENT:  ?Head: Normocephalic ?Eyes:EOM are normal ?Neck: Normal range of motion ?Cardiovascular: Normal rate ?Pulmonary/chest: Effort normal ?Neurologic: Patient is alert ?Skin: Skin is warm ?Psychiatric: Patient has normal mood and affect ? ? ?Ortho Exam: Ortho exam demonstrates good cervical spine range of motion.  5 out of 5 grip EPL FPL  interosseous resection extension bicep tricep and deltoid strength.  Patient's skin in the shoulder and arm region is consistent with steroid type skin even though he is only been on steroids for about 8 months.  Deltoid is functional.  Passive range of motion on the left is 35/80/130 with pain.  Slightly weaker infraspinatus supraspinatus testing on the left compared to the right.  Subscap strength 5+ out of 5.  No discrete AC joint tenderness left versus right. ? ?Specialty Comments:  ?No specialty comments available. ? ?Imaging: ?No results found. ? ? ?PMFS History: ?Patient Active Problem List  ? Diagnosis Date Noted  ? Pain in right wrist 05/12/2021  ? RUQ abdominal pain   ? Acute pancreatitis without infection or necrosis 12/02/2020  ? Pain in left shoulder 11/28/2020  ? Rheumatoid factor positive 08/29/2020  ? Polymyalgia rheumatica (HCC) 08/08/2020  ? Polyarthralgia 08/08/2020  ? High risk medication use 08/08/2020  ? Lumbosacral spondylosis with radiculopathy 11/27/2016  ? Atrophy of muscle of right lower leg 02/04/2014  ? History of post-polio syndrome 02/02/2014  ? Pain of left lower extremity 02/02/2014  ? Chronic back pain 05/14/2013  ? HTN (hypertension) 05/14/2013  ? Headache 12/22/2011  ? Sleep apnea 11/17/2011  ? Elevation of level of transaminase or lactic acid dehydrogenase (LDH) 09/18/2010  ? Anxiety 09/08/2010  ? Tachycardia 09/08/2010  ? Hyperlipemia 03/01/2006  ? TIA (transient ischemic attack) 03/01/2006  ? Tobacco use disorder 03/01/2006  ? Depressive disorder 10/16/2005  ? ?Past Medical History:  ?Diagnosis Date  ? Hemochromatosis   ? Hypertension   ?  ?Family History  ?Problem Relation Age of Onset  ? Arthritis Mother   ? Thyroid cancer Mother   ? Melanoma Father   ? Fibromyalgia Sister   ? Cancer Brother   ? Prostate cancer Brother   ? Williams syndrome Son   ?  ?Past Surgical History:  ?Procedure Laterality Date  ? ANTERIOR LATERAL LUMBAR FUSION WITH PERCUTANEOUS SCREW 1 LEVEL Right  11/27/2016  ? Procedure: Lumbar Three-Four Transpsoas Lumbar InterbodyFfusion;  Surgeon: Ditty, Loura HaltBenjamin Jared, MD;  [facility];  Service: Neurosurgery;  Laterality: Right;  L3-4 Transpsoas lumbar interbody fusion/L3-4 Pedicle screw fixation with posterolateral arthrodesis/minimally invasive decompression/Mazor  ? APPLICATION OF ROBOTIC ASSISTANCE FOR SPINAL PROCEDURE N/A 11/27/2016  ? Procedure: Lumbar Three-Four Pedicle Screw Fixation with Posterolateral Arthrodesis with Minimally Invasive Decompression with Application of Robotic Assistance;  Surgeon: Ditty, Loura HaltBenjamin Jared, MD;  [facility];  Service: Neurosurgery;  Laterality: N/A;  ? BACK SURGERY    ? x3   ? BACK SURGERY    ? battery surgery   ? spinal manipulation under anesthesia    ? ?Social History  ? ?Occupational History  ? Not on file  ?Tobacco Use  ? Smoking status: Former  ?  Packs/day: 2.00  ?  Years: 35.00  ?  Pack years: 70.00  ?  Types: Cigarettes  ?  Quit date: 2007  ?  Years since quitting: 16.2  ? Smokeless tobacco: Never  ?Vaping Use  ? Vaping Use: Never used  ?Substance and Sexual Activity  ? Alcohol use: Yes  ?  Alcohol/week: 9.0 standard drinks  ?  Types: 9 Cans of beer per week  ? Drug use: No  ? Sexual activity: Not on file  ? ? ? ? ?  ?

## 2021-06-14 ENCOUNTER — Encounter: Payer: Self-pay | Admitting: Orthopedic Surgery

## 2021-06-16 ENCOUNTER — Ambulatory Visit
Admission: RE | Admit: 2021-06-16 | Discharge: 2021-06-16 | Disposition: A | Discharge status: Home / Self Care | Payer: Medicare Other | Source: Ambulatory Visit | Attending: Orthopedic Surgery | Admitting: Orthopedic Surgery

## 2021-06-16 DIAGNOSIS — M19012 Primary osteoarthritis, left shoulder: Secondary | ICD-10-CM | POA: Diagnosis not present

## 2021-06-16 DIAGNOSIS — M79602 Pain in left arm: Secondary | ICD-10-CM

## 2021-06-17 ENCOUNTER — Other Ambulatory Visit: Payer: Medicare Other

## 2021-06-18 NOTE — Progress Notes (Signed)
Hi Debbie.  Blue sheet done at my computer area.  He wants early June I think.  Thanks

## 2021-06-20 ENCOUNTER — Ambulatory Visit: Payer: Medicare Other | Admitting: Orthopedic Surgery

## 2021-07-03 DIAGNOSIS — G8929 Other chronic pain: Secondary | ICD-10-CM | POA: Diagnosis not present

## 2021-07-03 DIAGNOSIS — Z0000 Encounter for general adult medical examination without abnormal findings: Secondary | ICD-10-CM | POA: Diagnosis not present

## 2021-07-03 DIAGNOSIS — I10 Essential (primary) hypertension: Secondary | ICD-10-CM | POA: Diagnosis not present

## 2021-07-03 DIAGNOSIS — E782 Mixed hyperlipidemia: Secondary | ICD-10-CM | POA: Diagnosis not present

## 2021-07-22 ENCOUNTER — Other Ambulatory Visit: Payer: Self-pay | Admitting: Internal Medicine

## 2021-07-22 DIAGNOSIS — M353 Polymyalgia rheumatica: Secondary | ICD-10-CM

## 2021-07-22 MED ORDER — PREDNISONE 5 MG PO TABS: ORAL_TABLET | ORAL | 2 refills | Status: DC

## 2021-07-22 NOTE — Telephone Encounter (Signed)
Next Visit: 08/12/2021  Last Visit: 05/12/2021  Last Fill: 04/24/2021  Dx: Polymyalgia rheumatica   Current Dose per office note on 05/12/2021: 5 mg prednisone daily at this time  Okay to refill Prednisone?

## 2021-07-22 NOTE — Telephone Encounter (Signed)
Patient left a voicemail requesting prescription refill of Prednisone to be sent to [facility] at [address]  Patient states he only has one pill remaining.

## 2021-07-27 ENCOUNTER — Other Ambulatory Visit: Payer: Self-pay | Admitting: Internal Medicine

## 2021-07-27 DIAGNOSIS — M353 Polymyalgia rheumatica: Secondary | ICD-10-CM

## 2021-07-31 NOTE — Progress Notes (Deleted)
Office Visit Note  Patient: Victor Ramirez             Date of Birth: 1956/02/26           MRN: [id number]             PCP: Wilfrid Lund, PA Referring: Wilfrid Lund, [place] Visit Date: 08/12/2021   Subjective:  No chief complaint on file.   History of Present Illness: Victor Ramirez is a 66 y.o. male here for follow up for PMR with multiple joint pains on low dose prednisone 5 mg daily.   Previous HPI 05/12/2021 Victor Ramirez is a 66 y.o. male here for follow up for PMR with multiple joint pains on low dose prednisone 5 mg daily. He completed a series of physical therapy for chronic joint pains especially shoulders but concerned about advanced structural problems possibly to need further imaging or procedures. He continues taking the prednisone 5 mg daily without major complications but is having worse symptoms. Right wrist pain at the ulnar side is frequent and right hand numbness intermittently particularly when driving or first thing in the morning. He has numerous forearm scratches and bleeding areas from a new puppy. The left shoulder hurt worse with PT sessions although his ROM improved very slightly. He has an appointment with Dr. August Saucer later today about the shoulder which has failed to improved with steroid injections, NSAIDs, prednisone, and PT.   Previous HPI 01/29/21 Victor Ramirez is a 66 y.o. male here for follow up for PMR on prednisone 7.5 mg daily. Since our last visit he was at the hospital last month with abdominal pain workup thought to represent pancreatitis. Symptoms are overall doing better in most areas except right hand, left shoulder, and neck. Left shoulder steroid injection last visit helped for about a month but then had worsening again. His right hand is hurting more in the wrist and hand going numb intermittently. Especially notices this during prolonged driving, and improves keeping wrist in neutral position.   Previous HPI: 08/08/20 Victor Ramirez is a  66 y.o. male referred for PMR. His symptoms started abruptly with severe pain in bilateral shoulders limiting use and mobility especially first thing in the morning.  He does not require any preceding injury or changes in health or medications. For evaluation and primary care clinic with x-ray imaging obtained that did not show any structural cause of symptoms he had markedly high inflammatory markers and was started on oral prednisone for suspected PMR.  He noticed dramatic improvement in symptoms within 2 days of starting the medicine.  At follow-up he was recommended to decrease the dose down to 15 mg of prednisone at which time he noticed a return of his symptoms.  He increased back to 20 mg and feels this is doing well again.  The most affected areas were in the base of his neck and bilateral upper back and shoulders with radiation down the upper portion of the arms.  He also felt like it was affecting his right hip and thigh much worse than left, where he has chronic deficits due to history of polio.  He has started to notice some weight gain with the oral prednisone otherwise no specific side effect problems.     No Rheumatology ROS completed.   PMFS History:  Patient Active Problem List   Diagnosis Date Noted   Pain in right wrist 05/12/2021   RUQ abdominal pain    Acute pancreatitis without  infection or necrosis 12/02/2020   Pain in left shoulder 11/28/2020   Rheumatoid factor positive 08/29/2020   Polymyalgia rheumatica (HCC) 08/08/2020   Polyarthralgia 08/08/2020   High risk medication use 08/08/2020   Lumbosacral spondylosis with radiculopathy 11/27/2016   Atrophy of muscle of right lower leg 02/04/2014   History of post-polio syndrome 02/02/2014   Pain of left lower extremity 02/02/2014   Chronic back pain 05/14/2013   HTN (hypertension) 05/14/2013   Headache 12/22/2011   Sleep apnea 11/17/2011   Elevation of level of transaminase or lactic acid dehydrogenase (LDH) 09/18/2010    Anxiety 09/08/2010   Tachycardia 09/08/2010   Hyperlipemia 03/01/2006   TIA (transient ischemic attack) 03/01/2006   Tobacco use disorder 03/01/2006   Depressive disorder 10/16/2005    Past Medical History:  Diagnosis Date   Hemochromatosis    Hypertension     Family History  Problem Relation Age of Onset   Arthritis Mother    Thyroid cancer Mother    Melanoma Father    Fibromyalgia Sister    Cancer Brother    Prostate cancer Brother    Williams syndrome Son    Past Surgical History:  Procedure Laterality Date   ANTERIOR LATERAL LUMBAR FUSION WITH PERCUTANEOUS SCREW 1 LEVEL Right 11/27/2016   Procedure: Lumbar Three-Four Transpsoas Lumbar InterbodyFfusion;  Surgeon: Ditty, Loura Halt, MD;  Location: [facility] OR;  Service: Neurosurgery;  Laterality: Right;  L3-4 Transpsoas lumbar interbody fusion/L3-4 Pedicle screw fixation with posterolateral arthrodesis/minimally invasive decompression/Mazor   APPLICATION OF ROBOTIC ASSISTANCE FOR SPINAL PROCEDURE N/A 11/27/2016   Procedure: Lumbar Three-Four Pedicle Screw Fixation with Posterolateral Arthrodesis with Minimally Invasive Decompression with Application of Robotic Assistance;  Surgeon: Ditty, Loura Halt, MD;  Location: [facility] OR;  Service: Neurosurgery;  Laterality: N/A;   BACK SURGERY     x3    BACK SURGERY     battery surgery    spinal manipulation under anesthesia     Social History   Social History Narrative   Not on file   Immunization History  Administered Date(s) Administered   PFIZER(Purple Top)SARS-COV-2 Vaccination 06/01/2019, 07/01/2019     Objective: Vital Signs: There were no vitals taken for this visit.   Physical Exam   Musculoskeletal Exam: ***  CDAI Exam: CDAI Score: -- Patient Global: --; Provider Global: -- Swollen: --; Tender: -- Joint Exam 08/12/2021   No joint exam has been documented for this visit   There is currently no information documented on the homunculus. Go to the Rheumatology  activity and complete the homunculus joint exam.  Investigation: No additional findings.  Imaging: No results found.  Recent Labs: Lab Results  Component Value Date   WBC 14.9 (H) 12/03/2020   HGB 13.9 12/03/2020   PLT 256 12/03/2020   NA 134 (L) 12/03/2020   K 4.0 12/03/2020   CL 100 12/03/2020   CO2 22 12/03/2020   GLUCOSE 77 12/03/2020   BUN 11 12/03/2020   CREATININE 0.81 12/03/2020   BILITOT 1.0 12/03/2020   ALKPHOS 71 12/03/2020   AST 33 12/03/2020   ALT 46 (H) 12/03/2020   PROT 7.0 12/03/2020   ALBUMIN 3.6 12/03/2020   CALCIUM 8.9 12/03/2020   GFRAA 111 08/08/2020    Speciality Comments: No specialty comments available.  Procedures:  No procedures performed Allergies: Bee venom   Assessment / Plan:     Visit Diagnoses: No diagnosis found.  ***  Orders: No orders of the defined types were placed in this encounter.  No orders  of the defined types were placed in this encounter.    Follow-Up Instructions: No follow-ups on file.   Ellen HenriMarissa C Brenin Heidelberger, CMA  Note - This record has been created using Animal [employer]agon software.  Chart creation errors have been sought, but may not always  have been located. Such creation errors do not reflect on  the standard of medical care.

## 2021-08-04 ENCOUNTER — Telehealth: Payer: Self-pay | Admitting: Internal Medicine

## 2021-08-04 NOTE — Telephone Encounter (Signed)
Patient called the office stating he is taking prednisone for PMR. Patient states this morning he got out of bed and had vertigo. Patient states he did some research and prednisone can cause vertigo. Patient states he called his PCP and they are referring him to an ENT. Patient states he does not know what kind of doctor to see for this. Patient requests a call back if this is being caused by the prednisone Dr. Dimple Casey has him on.

## 2021-08-04 NOTE — Telephone Encounter (Signed)
I called patient, patient verbalized understanding. 

## 2021-08-04 NOTE — Telephone Encounter (Signed)
Prednisone can cause dizziness in some people. Usually it would be more persistent and not just start out of nowhere, unless he just restarted the prednisone. If it is causing this there wouldn't be much to do about it besides stopping the prednisone.  ENT would be able to check for and treat vertigo causes coming from the inner ear system. If they find no problem the other possible specialist for this would be neurology.

## 2021-08-06 ENCOUNTER — Telehealth: Payer: Self-pay | Admitting: Radiology

## 2021-08-07 NOTE — Telephone Encounter (Signed)
Time for Victor Ramirez eval it seems

## 2021-08-12 ENCOUNTER — Ambulatory Visit: Payer: Medicare Other | Admitting: Surgical

## 2021-08-12 ENCOUNTER — Encounter: Payer: Self-pay | Admitting: Surgical

## 2021-08-12 ENCOUNTER — Other Ambulatory Visit: Payer: Self-pay

## 2021-08-12 ENCOUNTER — Ambulatory Visit: Payer: Medicare Other | Admitting: Internal Medicine

## 2021-08-12 DIAGNOSIS — M47812 Spondylosis without myelopathy or radiculopathy, cervical region: Secondary | ICD-10-CM | POA: Diagnosis not present

## 2021-08-12 DIAGNOSIS — M79602 Pain in left arm: Secondary | ICD-10-CM | POA: Diagnosis not present

## 2021-08-12 DIAGNOSIS — M353 Polymyalgia rheumatica: Secondary | ICD-10-CM

## 2021-08-12 DIAGNOSIS — M62561 Muscle wasting and atrophy, not elsewhere classified, right lower leg: Secondary | ICD-10-CM

## 2021-08-12 DIAGNOSIS — R768 Other specified abnormal immunological findings in serum: Secondary | ICD-10-CM

## 2021-08-12 DIAGNOSIS — M25531 Pain in right wrist: Secondary | ICD-10-CM

## 2021-08-12 NOTE — Progress Notes (Signed)
Office Visit Note   Patient: Victor Ramirez           Date of Birth: 05-16-1955           MRN: [id number] Visit Date: 08/12/2021 Requested by: Lois Huxley, [place],  [place] [address] PCP: Lois Huxley, [place]  Subjective: Chief Complaint  Patient presents with   Neck - Pain    HPI: Victor Ramirez is a 66 y.o. male who presents to the office complaining of neck pain.  Patient has history of cervical spine spondylosis with foraminal stenosis at multiple levels.  He has had increased pain in his neck since last Monday and Tuesday when he was in his bed and he turned his head to the right and felt a popping sensation and he had increased pain to the point where the room was spinning and that he turned his head to the left and all of his symptoms stopped.  He has not had reproduction of the symptoms since that day.  He does note some constant increased pain in the neck.  He takes gabapentin, muscle relaxer, hydrocodone without much relief of his symptoms.  He also has a history of polymyalgia rheumatica for which he sees Dr. Benjamine Mola.  He takes prednisone 5 mg daily.  He is currently scheduled for left shoulder reverse shoulder arthroplasty in July 2023 with Dr. Marlou Sa.  No significant radicular pain down either arm aside from the pain that travels through the left shoulder and into the left elbow though this may be related to his left shoulder pathology.  He has not had any ESI's or physical therapy for his neck.  No history of neck surgery..                ROS: All systems reviewed are negative as they relate to the chief complaint within the history of present illness.  Patient denies fevers or chills.  Assessment & Plan: Visit Diagnoses:  1. Arthropathy of cervical facet joint   2. Left arm pain     Plan: Patient is a 66 year old male who presents for evaluation of cervical spine pain.  He has increased pain over the last week since an episode where he was turning his  head to the right and had increased pain and spinning of the room.  No reproduction of this vertigo type symptom.  No history of vertigo.  Medications that he currently is taking is not very helpful.  He has recent MRI from April 2023 demonstrating moderate foraminal narrowing to some extent at pretty much every level of the cervical spine along with facet arthritis throughout.  There are degenerative changes of the endplates throughout the cervical spine as well.  Most of his pain seems to be located to the axial cervical spine.  After discussion of options, patient would like to try physical therapy at [facility] health therapy location on [facility].  He has had good experience with physical therapist at that facility.  ESI not great option at this point in time as he has upcoming left shoulder replacement but if he is having no significant improvement in his neck pain by about 4 to 6 weeks postoperatively (which would mark about 2 months out from today's date), could consider ESI at that point.  Patient agreed with plan.  Follow-up after left shoulder replacement.  Follow-Up Instructions: No follow-ups on file.   Orders:  Orders Placed This Encounter  Procedures   Ambulatory  referral to Physical Therapy   No orders of the defined types were placed in this encounter.     Procedures: No procedures performed   Clinical Data: No additional findings.  Objective: Vital Signs: There were no vitals taken for this visit.  Physical Exam:  Constitutional: Patient appears well-developed HEENT:  Head: Normocephalic Eyes:EOM are normal Neck: Normal range of motion Cardiovascular: Normal rate Pulmonary/chest: Effort normal Neurologic: Patient is alert Skin: Skin is warm Psychiatric: Patient has normal mood and affect  Ortho Exam: Ortho exam demonstrates tenderness throughout the axial cervical spine.  Negative Spurling sign.  Negative Lhermitte sign.  5/5 motor strength of bilateral grip  strength, finger abduction, pronation, bicep, tricep, deltoid.  5 -/5 strength of supination of the left arm compared with 5/5 on the right.  Axillary nerve intact with deltoid firing in both shoulders.  Fairly well-preserved rotation of the cervical spine to the right and left as well as flexion and extension.  Specialty Comments:  No specialty comments available.  Imaging: No results found.   PMFS History: Patient Active Problem List   Diagnosis Date Noted   Pain in right wrist 05/12/2021   RUQ abdominal pain    Acute pancreatitis without infection or necrosis 12/02/2020   Pain in left shoulder 11/28/2020   Rheumatoid factor positive 08/29/2020   Polymyalgia rheumatica ([place]) 08/08/2020   Polyarthralgia 08/08/2020   High risk medication use 08/08/2020   Lumbosacral spondylosis with radiculopathy 11/27/2016   Atrophy of muscle of right lower leg 02/04/2014   History of post-polio syndrome 02/02/2014   Pain of left lower extremity 02/02/2014   Chronic back pain 05/14/2013   HTN (hypertension) 05/14/2013   Headache 12/22/2011   Sleep apnea 11/17/2011   Elevation of level of transaminase or lactic acid dehydrogenase (LDH) 09/18/2010   Anxiety 09/08/2010   Tachycardia 09/08/2010   Hyperlipemia 03/01/2006   TIA (transient ischemic attack) 03/01/2006   Tobacco use disorder 03/01/2006   Depressive disorder 10/16/2005   Past Medical History:  Diagnosis Date   Hemochromatosis    Hypertension     Family History  Problem Relation Age of Onset   Arthritis Mother    Thyroid cancer Mother    Melanoma Father    Fibromyalgia Sister    Cancer Brother    Prostate cancer Brother    Williams syndrome Son     Past Surgical History:  Procedure Laterality Date   ANTERIOR LATERAL LUMBAR FUSION WITH PERCUTANEOUS SCREW 1 LEVEL Right 11/27/2016   Procedure: Lumbar Three-Four Transpsoas Lumbar InterbodyFfusion;  Surgeon: Ditty, Kevan Ny, MD;  Location: [place];  Service: Neurosurgery;   Laterality: Right;  L3-4 Transpsoas lumbar interbody fusion/L3-4 Pedicle screw fixation with posterolateral arthrodesis/minimally invasive decompression/Mazor   APPLICATION OF ROBOTIC ASSISTANCE FOR SPINAL PROCEDURE N/A 11/27/2016   Procedure: Lumbar Three-Four Pedicle Screw Fixation with Posterolateral Arthrodesis with Minimally Invasive Decompression with Application of Robotic Assistance;  Surgeon: Ditty, Kevan Ny, MD;  Location: [place];  Service: Neurosurgery;  Laterality: N/A;   BACK SURGERY     x3    BACK SURGERY     battery surgery    spinal manipulation under anesthesia     Social History   Occupational History   Not on file  Tobacco Use   Smoking status: Former    Packs/day: 2.00    Years: 35.00    Total pack years: 70.00    Types: Cigarettes    Quit date: 2007    Years since quitting: 16.4   Smokeless  tobacco: Never  Vaping Use   Vaping Use: Never used  Substance and Sexual Activity   Alcohol use: Yes    Alcohol/week: 9.0 standard drinks of alcohol    Types: 9 Cans of beer per week   Drug use: No   Sexual activity: Not on file

## 2021-09-05 NOTE — Pre-Procedure Instructions (Signed)
Surgical Instructions    Your procedure is scheduled on Tuesday, July 18th.  Report to [facility] Main Entrance "A" at 5:30 A.M., then check in with the Admitting office.  Call this number if you have problems the morning of surgery:  [phone]   If you have any questions prior to your surgery date call [phone]: Open Monday-Friday 8am-4pm    Remember:  Do not eat after midnight the night before your surgery  You may drink clear liquids until 4:30 a.m. the morning of your surgery.   Clear liquids allowed are: Water, Non-Citrus Juices (without pulp), Carbonated Beverages, Clear Tea, Black Coffee ONLY (NO MILK, CREAM OR POWDERED CREAMER of any kind), and Gatorade.   Enhanced Recovery after Surgery for Orthopedics Enhanced Recovery after Surgery is a protocol used to improve the stress on your body and your recovery after surgery.  Patient Instructions  The day of surgery (if you do NOT have diabetes):  Drink ONE (1) Pre-Surgery Clear Ensure by 4:30 am the morning of surgery   This drink was given to you during your hospital  pre-op appointment visit. Nothing else to drink after completing the  Pre-Surgery Clear Ensure.         If you have questions, please contact your surgeon's office.     Take these medicines the morning of surgery with A SIP OF WATER:  buPROPion (WELLBUTRIN XL) fluticasone (FLONASE) cetirizine (ZYRTEC)  gabapentin (NEURONTIN) methocarbamol (ROBAXIN) predniSONE (DELTASONE)  rosuvastatin (CRESTOR)  HYDROcodone-acetaminophen (NORCO)-as needed   As of today, STOP taking any Aspirin (unless otherwise instructed by your surgeon) Aleve, Naproxen, Ibuprofen, Motrin, Advil, Goody's, BC's, all herbal medications, fish oil, and all vitamins. This includes: diclofenac Sodium (VOLTAREN) 1 % GEL.           Do not wear jewelry or makeup Do not wear lotions, powders, colognes, or deodorant. Men may shave face and neck. Do not bring valuables to the  hospital. Do not wear nail polish, gel polish, artificial nails, or any other type of covering on natural nails (fingers and toes) If you have artificial nails or gel coating that need to be removed by a nail salon, please have this removed prior to surgery. Artificial nails or gel coating may interfere with anesthesia's ability to adequately monitor your vital signs.  [facility] is not responsible for any belongings or valuables. .   Do NOT Smoke (Tobacco/Vaping)  24 hours prior to your procedure  If you use a CPAP at night, you may bring your mask for your overnight stay.   Contacts, glasses, hearing aids, dentures or partials may not be worn into surgery, please bring cases for these belongings   For patients admitted to the hospital, discharge time will be determined by your treatment team.   Patients discharged the day of surgery will not be allowed to drive home, and someone needs to stay with them for 24 hours.   SURGICAL WAITING ROOM VISITATION Patients having surgery or a procedure in a hospital may have two support people. Children under the age of 88 must have an adult with them who is not the patient. They may stay in the waiting area during the procedure and may switch out with other visitors. If the patient needs to stay at the hospital during part of their recovery, the visitor guidelines for inpatient rooms apply.  Please refer to the [facility] website for the visitor guidelines for Inpatients (after your surgery is over and you are in a regular room).  Special instructions:    Oral Hygiene is also important to reduce your risk of infection.  Remember - BRUSH YOUR TEETH THE MORNING OF SURGERY WITH YOUR REGULAR TOOTHPASTE                                   [place]- Preparing for Total Shoulder Arthroplasty   Before surgery, you can play an important role. Because skin is not sterile, your skin needs to be as free of germs as possible. You can reduce the  number of germs on your skin by using the following products. Benzoyl Peroxide Gel Reduces the number of germs present on the skin Applied twice a day to shoulder area starting two days before surgery   Chlorhexidine Gluconate (CHG) Soap An antiseptic cleaner that kills germs and bonds with the skin to continue killing germs even after washing Used for showering the night before surgery and morning of surgery   Oral Hygiene is also important to reduce your risk of infection.                                    Remember - BRUSH YOUR TEETH THE MORNING OF SURGERY WITH YOUR REGULAR TOOTHPASTE  ==================================================================  Please follow these instructions carefully:  BENZOYL PEROXIDE 5% GEL  Please do not use if you have an allergy to benzoyl peroxide.   If your skin becomes reddened/irritated stop using the benzoyl peroxide.  Starting two days before surgery, apply as follows: Apply benzoyl peroxide in the morning and at night. Apply after taking a shower. If you are not taking a shower clean entire shoulder front, back, and side along with the armpit with a clean wet washcloth.  Place a quarter-sized dollop on your shoulder and rub in thoroughly, making sure to cover the front, back, and side of your shoulder, along with the armpit.   2 days before ____ AM   ____ PM              1 day before ____ AM   ____ PM                             Do this twice a day for two days.  (Last application is the night before surgery, AFTER using the CHG soap as described below).  Do NOT apply benzoyl peroxide gel on the day of surgery.  CHLORHEXIDINE GLUCONATE (CHG) SOAP  Please do not use if you have an allergy to CHG or antibacterial soaps. If your skin becomes reddened/irritated stop using the CHG.   Do not shave (including legs and underarms) for at least 48 hours prior to first CHG shower. It is OK to shave your face.  Starting the night before surgery,  use CHG soap as follows:  Shower the NIGHT BEFORE SURGERY and MORNING OF SURGERY with CHG.  If you choose to wash your hair, wash your hair first as usual with your normal shampoo.  After shampooing, rinse your hair and body thoroughly to remove the shampoo.  Use CHG as you would any other liquid soap.  You can apply CHG directly to the skin and wash gently with a scrungie or a clean washcloth.  Apply the CHG soap to your body ONLY FROM THE NECK DOWN.  Do not use on open  wounds or open sores.  Avoid contact with your eyes, ears, mouth, and genitals (private parts).  Wash face and genitals (private parts) with your normal soap.  Wash thoroughly, paying special attention to the area where your surgery will be performed.  Thoroughly rinse your body with warm water from the neck down.  DO NOT shower/wash with your normal soap after using and rinsing off the CHG soap.   Pat yourself dry with a CLEAN TOWEL.    Apply benzoyl peroxide.   Wear CLEAN PAJAMAS to bed the night before surgery; wear comfortable clothes the morning of surgery.  Place CLEAN SHEETS on your bed the night of your first shower and DO NOT SLEEP WITH PETS.  Day of Surgery: Shower as above Do not apply any deodorants/lotions.  Please wear clean clothes to the hospital/surgery center.   Remember to brush your teeth WITH YOUR REGULAR TOOTHPASTE.     If you received a COVID test during your pre-op visit, it is requested that you wear a mask when out in public, stay away from anyone that may not be feeling well, and notify your surgeon if you develop symptoms. If you have been in contact with anyone that has tested positive in the last 10 days, please notify your surgeon.    Please read over the following fact sheets that you were given.

## 2021-09-08 ENCOUNTER — Encounter (HOSPITAL_COMMUNITY): Payer: Self-pay

## 2021-09-08 ENCOUNTER — Encounter (HOSPITAL_COMMUNITY)
Admission: RE | Admit: 2021-09-08 | Discharge: 2021-09-08 | Disposition: A | Discharge status: Home / Self Care | Payer: Medicare Other | Source: Ambulatory Visit | Attending: Orthopedic Surgery | Admitting: Orthopedic Surgery

## 2021-09-08 ENCOUNTER — Other Ambulatory Visit: Payer: Self-pay

## 2021-09-08 VITALS — BP 124/83 | HR 86 | Temp 98.0°F | Resp 17 | Ht 68.0 in | Wt 189.9 lb

## 2021-09-08 DIAGNOSIS — Z01818 Encounter for other preprocedural examination: Secondary | ICD-10-CM | POA: Insufficient documentation

## 2021-09-08 HISTORY — DX: Unspecified osteoarthritis, unspecified site: M19.90

## 2021-09-08 LAB — URINALYSIS, COMPLETE (UACMP) WITH MICROSCOPIC
Bacteria, UA: NONE SEEN
Bilirubin Urine: NEGATIVE
Glucose, UA: NEGATIVE mg/dL
Hgb urine dipstick: NEGATIVE
Ketones, ur: NEGATIVE mg/dL
Leukocytes,Ua: NEGATIVE
Nitrite: NEGATIVE
Protein, ur: NEGATIVE mg/dL
Specific Gravity, Urine: 1.02 (ref 1.005–1.030)
pH: 8 (ref 5.0–8.0)

## 2021-09-08 LAB — CBC
HCT: 43.9 % (ref 39.0–52.0)
Hemoglobin: 14.8 g/dL (ref 13.0–17.0)
MCH: 32.4 pg (ref 26.0–34.0)
MCHC: 33.7 g/dL (ref 30.0–36.0)
MCV: 96.1 fL (ref 80.0–100.0)
Platelets: 284 10*3/uL (ref 150–400)
RBC: 4.57 MIL/uL (ref 4.22–5.81)
RDW: 13.1 % (ref 11.5–15.5)
WBC: 11.4 10*3/uL — ABNORMAL HIGH (ref 4.0–10.5)
nRBC: 0 % (ref 0.0–0.2)

## 2021-09-08 LAB — BASIC METABOLIC PANEL
Anion gap: 8 (ref 5–15)
BUN: 12 mg/dL (ref 8–23)
CO2: 31 mmol/L (ref 22–32)
Calcium: 9.4 mg/dL (ref 8.9–10.3)
Chloride: 100 mmol/L (ref 98–111)
Creatinine, Ser: 0.8 mg/dL (ref 0.61–1.24)
GFR, Estimated: >60 mL/min (ref >60)
Glucose, Bld: 102 mg/dL — ABNORMAL HIGH (ref 70–99)
Potassium: 4.8 mmol/L (ref 3.5–5.1)
Sodium: 139 mmol/L (ref 135–145)

## 2021-09-08 LAB — SURGICAL PCR SCREEN
MRSA, PCR: NEGATIVE
Staphylococcus aureus: NEGATIVE

## 2021-09-08 NOTE — Progress Notes (Signed)
PCP - Horton Marshall PA Cardiologist - none  PPM/ICD - denies Device Orders -  Rep Notified -   Chest x-ray - na EKG - 09/08/21 Stress Test - denies ECHO - denies Cardiac Cath - denies  Sleep Study - no CPAP -   Fasting Blood Sugar - na Checks Blood Sugar _____ times a day  Blood Thinner Instructions:na Aspirin Instructions:na  ERAS Protcol -clear liquids until 0430 PRE-SURGERY Ensure or G2- Ensure  COVID TEST- na   Anesthesia review: no  Patient denies shortness of breath, fever, cough and chest pain at PAT appointment   All instructions explained to the patient, with a verbal understanding of the material. Patient agrees to go over the instructions while at home for a better understanding. Patient also instructed to wear a mask when out in public prior to surgery. The opportunity to ask questions was provided.

## 2021-09-12 ENCOUNTER — Telehealth: Payer: Self-pay | Admitting: Orthopedic Surgery

## 2021-09-12 NOTE — Telephone Encounter (Signed)
Leanna from [place] called with denial for pt to have CPM machine they are also [place] fax denial. Leanna phone number is [phone]

## 2021-09-15 NOTE — Anesthesia Preprocedure Evaluation (Signed)
Anesthesia Evaluation  Patient identified by MRN, date of birth, ID band Patient awake    Reviewed: Allergy & Precautions, NPO status , Patient's Chart, lab work & pertinent test results  Airway Mallampati: II  TM Distance: >3 FB Neck ROM: Full    Dental  (+) Edentulous Upper, Upper Dentures, Missing, Dental Advisory Given   Pulmonary sleep apnea , former smoker,    Pulmonary exam normal breath sounds clear to auscultation       Cardiovascular hypertension, Pt. on medications Normal cardiovascular exam Rhythm:Regular Rate:Normal     Neuro/Psych  Headaches, PSYCHIATRIC DISORDERS Anxiety Depression TIA Neuromuscular disease    GI/Hepatic Neg liver ROS,   Endo/Other  negative endocrine ROS  Renal/GU      Musculoskeletal  (+) Arthritis ,   Abdominal   Peds  Hematology   Anesthesia Other Findings   Reproductive/Obstetrics                            Anesthesia Physical  Anesthesia Plan  ASA: 2  Anesthesia Plan: General   Post-op Pain Management: Tylenol PO (pre-op)*, Celebrex PO (pre-op)* and Regional block*   Induction: Intravenous  PONV Risk Score and Plan: 2 and Ondansetron, Dexamethasone, Treatment may vary due to age or medical condition and Midazolam  Airway Management Planned: Oral ETT  Additional Equipment:   Intra-op Plan:   Post-operative Plan: Extubation in OR  Informed Consent: I have reviewed the patients History and Physical, chart, labs and discussed the procedure including the risks, benefits and alternatives for the proposed anesthesia with the patient or authorized representative who has indicated his/her understanding and acceptance.     Dental advisory given  Plan Discussed with: CRNA  Anesthesia Plan Comments:        Anesthesia Quick Evaluation

## 2021-09-16 ENCOUNTER — Ambulatory Visit (HOSPITAL_BASED_OUTPATIENT_CLINIC_OR_DEPARTMENT_OTHER): Payer: Medicare Other | Admitting: Anesthesiology

## 2021-09-16 ENCOUNTER — Encounter (HOSPITAL_COMMUNITY)
Admission: RE | Disposition: A | Discharge status: Home / Self Care | Payer: Self-pay | Source: Home / Self Care | Attending: Orthopedic Surgery

## 2021-09-16 ENCOUNTER — Ambulatory Visit (HOSPITAL_COMMUNITY): Payer: Medicare Other | Admitting: Anesthesiology

## 2021-09-16 ENCOUNTER — Observation Stay (HOSPITAL_COMMUNITY): Payer: Medicare Other

## 2021-09-16 ENCOUNTER — Encounter (HOSPITAL_COMMUNITY): Payer: Self-pay | Admitting: Orthopedic Surgery

## 2021-09-16 ENCOUNTER — Other Ambulatory Visit: Payer: Self-pay

## 2021-09-16 ENCOUNTER — Observation Stay (HOSPITAL_COMMUNITY)
Admission: RE | Admit: 2021-09-16 | Discharge: 2021-09-17 | Disposition: A | Discharge status: Home / Self Care | Payer: Medicare Other | Attending: Orthopedic Surgery | Admitting: Orthopedic Surgery

## 2021-09-16 DIAGNOSIS — Z01818 Encounter for other preprocedural examination: Secondary | ICD-10-CM

## 2021-09-16 DIAGNOSIS — I10 Essential (primary) hypertension: Secondary | ICD-10-CM | POA: Diagnosis not present

## 2021-09-16 DIAGNOSIS — Z96612 Presence of left artificial shoulder joint: Secondary | ICD-10-CM

## 2021-09-16 DIAGNOSIS — M19012 Primary osteoarthritis, left shoulder: Secondary | ICD-10-CM | POA: Diagnosis not present

## 2021-09-16 DIAGNOSIS — Z87891 Personal history of nicotine dependence: Secondary | ICD-10-CM | POA: Insufficient documentation

## 2021-09-16 DIAGNOSIS — Z471 Aftercare following joint replacement surgery: Secondary | ICD-10-CM | POA: Diagnosis not present

## 2021-09-16 DIAGNOSIS — Z79899 Other long term (current) drug therapy: Secondary | ICD-10-CM | POA: Insufficient documentation

## 2021-09-16 DIAGNOSIS — G8918 Other acute postprocedural pain: Secondary | ICD-10-CM | POA: Diagnosis not present

## 2021-09-16 DIAGNOSIS — M19019 Primary osteoarthritis, unspecified shoulder: Secondary | ICD-10-CM | POA: Diagnosis present

## 2021-09-16 HISTORY — PX: REVERSE SHOULDER ARTHROPLASTY: SHX5054

## 2021-09-16 SURGERY — ARTHROPLASTY, SHOULDER, TOTAL, REVERSE
Anesthesia: General | Site: Shoulder | Laterality: Left

## 2021-09-16 MED ORDER — CELECOXIB 200 MG PO CAPS
200.0000 mg | ORAL_CAPSULE | Freq: Once | ORAL | Status: AC
Start: 2021-09-16 — End: 2021-09-16
  Administered 2021-09-16: 200 mg via ORAL

## 2021-09-16 MED ORDER — MIDAZOLAM HCL 2 MG/2ML IJ SOLN
INTRAMUSCULAR | Status: AC
Start: 2021-09-16 — End: ?
  Filled 2021-09-16: qty 2

## 2021-09-16 MED ORDER — FENTANYL CITRATE (PF) 250 MCG/5ML IJ SOLN
INTRAMUSCULAR | Status: DC | PRN
Start: 2021-09-16 — End: 2021-09-16
  Administered 2021-09-16 (×2): 50 ug via INTRAVENOUS
  Administered 2021-09-16: 100 ug via INTRAVENOUS

## 2021-09-16 MED ORDER — MENTHOL 3 MG MT LOZG: 1.0000 | LOZENGE | OROMUCOSAL | Status: DC | PRN

## 2021-09-16 MED ORDER — ACETAMINOPHEN 500 MG PO TABS: 1000.0000 mg | ORAL_TABLET | Freq: Once | ORAL | Status: DC

## 2021-09-16 MED ORDER — FLUTICASONE PROPIONATE 50 MCG/ACT NA SUSP
2.0000 | Freq: Every day | NASAL | Status: DC
  Filled 2021-09-16: qty 16

## 2021-09-16 MED ORDER — METHOCARBAMOL 1000 MG/10ML IJ SOLN: 500.0000 mg | Freq: Four times a day (QID) | INTRAVENOUS | Status: DC | PRN

## 2021-09-16 MED ORDER — DEXAMETHASONE SODIUM PHOSPHATE 10 MG/ML IJ SOLN
INTRAMUSCULAR | Status: DC | PRN
  Administered 2021-09-16: 10 mg via INTRAVENOUS

## 2021-09-16 MED ORDER — CHLORHEXIDINE GLUCONATE 0.12 % MT SOLN
OROMUCOSAL | Status: AC
  Administered 2021-09-16: 15 mL via OROMUCOSAL
  Filled 2021-09-16: qty 15

## 2021-09-16 MED ORDER — FENTANYL CITRATE (PF) 250 MCG/5ML IJ SOLN
INTRAMUSCULAR | Status: AC
  Filled 2021-09-16: qty 5

## 2021-09-16 MED ORDER — PROPOFOL 10 MG/ML IV BOLUS
INTRAVENOUS | Status: DC | PRN
  Administered 2021-09-16: 160 mg via INTRAVENOUS
  Administered 2021-09-16: 50 mg via INTRAVENOUS
  Administered 2021-09-16: 40 mg via INTRAVENOUS

## 2021-09-16 MED ORDER — IRRISEPT - 450ML BOTTLE WITH 0.05% CHG IN STERILE WATER, USP 99.95% OPTIME
TOPICAL | Status: DC | PRN
  Administered 2021-09-16: 450 mL

## 2021-09-16 MED ORDER — ROCURONIUM BROMIDE 10 MG/ML (PF) SYRINGE
PREFILLED_SYRINGE | INTRAVENOUS | Status: DC | PRN
  Administered 2021-09-16: 40 mg via INTRAVENOUS

## 2021-09-16 MED ORDER — MIDAZOLAM HCL 2 MG/2ML IJ SOLN
INTRAMUSCULAR | Status: DC | PRN
  Administered 2021-09-16: 2 mg via INTRAVENOUS

## 2021-09-16 MED ORDER — ACETAMINOPHEN 500 MG PO TABS
1000.0000 mg | ORAL_TABLET | Freq: Four times a day (QID) | ORAL | Status: DC
  Administered 2021-09-16 – 2021-09-17 (×3): 1000 mg via ORAL
  Filled 2021-09-16 (×4): qty 2

## 2021-09-16 MED ORDER — ONDANSETRON HCL 4 MG PO TABS: 4.0000 mg | ORAL_TABLET | Freq: Four times a day (QID) | ORAL | Status: DC | PRN

## 2021-09-16 MED ORDER — BUPROPION HCL ER (XL) 150 MG PO TB24: 150.0000 mg | ORAL_TABLET | Freq: Every morning | ORAL | Status: DC

## 2021-09-16 MED ORDER — BUPIVACAINE LIPOSOME 1.3 % IJ SUSP
INTRAMUSCULAR | Status: DC | PRN
  Administered 2021-09-16: 10 mL via PERINEURAL

## 2021-09-16 MED ORDER — ACETAMINOPHEN 325 MG PO TABS
ORAL_TABLET | ORAL | Status: AC
  Filled 2021-09-16: qty 2

## 2021-09-16 MED ORDER — VANCOMYCIN HCL 1000 MG IV SOLR
INTRAVENOUS | Status: DC | PRN
  Administered 2021-09-16: 1000 mg

## 2021-09-16 MED ORDER — ACETAMINOPHEN 325 MG PO TABS
650.0000 mg | ORAL_TABLET | Freq: Once | ORAL | Status: AC
  Administered 2021-09-16: 650 mg via ORAL

## 2021-09-16 MED ORDER — FENTANYL CITRATE (PF) 100 MCG/2ML IJ SOLN: 25.0000 ug | INTRAMUSCULAR | Status: DC | PRN

## 2021-09-16 MED ORDER — LISINOPRIL 20 MG PO TABS
40.0000 mg | ORAL_TABLET | Freq: Every day | ORAL | Status: DC
  Administered 2021-09-16: 40 mg via ORAL
  Filled 2021-09-16: qty 2

## 2021-09-16 MED ORDER — PREDNISONE 5 MG PO TABS
5.0000 mg | ORAL_TABLET | Freq: Every day | ORAL | Status: DC
  Administered 2021-09-17: 5 mg via ORAL
  Filled 2021-09-16: qty 1

## 2021-09-16 MED ORDER — METOCLOPRAMIDE HCL 5 MG PO TABS: 5.0000 mg | ORAL_TABLET | Freq: Three times a day (TID) | ORAL | Status: DC | PRN

## 2021-09-16 MED ORDER — CEFAZOLIN SODIUM-DEXTROSE 2-4 GM/100ML-% IV SOLN
2.0000 g | INTRAVENOUS | Status: AC
  Administered 2021-09-16: 2 g via INTRAVENOUS

## 2021-09-16 MED ORDER — ONDANSETRON HCL 4 MG/2ML IJ SOLN
INTRAMUSCULAR | Status: DC | PRN
  Administered 2021-09-16: 4 mg via INTRAVENOUS

## 2021-09-16 MED ORDER — BUPIVACAINE HCL (PF) 0.5 % IJ SOLN
INTRAMUSCULAR | Status: DC | PRN
  Administered 2021-09-16: 10 mL via PERINEURAL

## 2021-09-16 MED ORDER — METOCLOPRAMIDE HCL 5 MG/ML IJ SOLN: 5.0000 mg | Freq: Three times a day (TID) | INTRAMUSCULAR | Status: DC | PRN

## 2021-09-16 MED ORDER — ORAL CARE MOUTH RINSE: 15.0000 mL | Freq: Once | OROMUCOSAL | Status: AC

## 2021-09-16 MED ORDER — CHLORHEXIDINE GLUCONATE 0.12 % MT SOLN: 15.0000 mL | Freq: Once | OROMUCOSAL | Status: AC

## 2021-09-16 MED ORDER — EPHEDRINE 5 MG/ML INJ
INTRAVENOUS | Status: AC
  Filled 2021-09-16: qty 5

## 2021-09-16 MED ORDER — PHENYLEPHRINE 80 MCG/ML (10ML) SYRINGE FOR IV PUSH (FOR BLOOD PRESSURE SUPPORT)
PREFILLED_SYRINGE | INTRAVENOUS | Status: AC
  Filled 2021-09-16: qty 10

## 2021-09-16 MED ORDER — LIDOCAINE 2% (20 MG/ML) 5 ML SYRINGE
INTRAMUSCULAR | Status: DC | PRN
  Administered 2021-09-16: 80 mg via INTRAVENOUS

## 2021-09-16 MED ORDER — POVIDONE-IODINE 10 % EX SWAB
2.0000 | Freq: Once | CUTANEOUS | Status: AC
  Administered 2021-09-16: 2 via TOPICAL

## 2021-09-16 MED ORDER — ONDANSETRON HCL 4 MG/2ML IJ SOLN: 4.0000 mg | Freq: Four times a day (QID) | INTRAMUSCULAR | Status: DC | PRN

## 2021-09-16 MED ORDER — PROMETHAZINE HCL 25 MG/ML IJ SOLN: 6.2500 mg | INTRAMUSCULAR | Status: DC | PRN

## 2021-09-16 MED ORDER — PHENOL 1.4 % MT LIQD: 1.0000 | OROMUCOSAL | Status: DC | PRN

## 2021-09-16 MED ORDER — MEPERIDINE HCL 25 MG/ML IJ SOLN: 6.2500 mg | INTRAMUSCULAR | Status: DC | PRN

## 2021-09-16 MED ORDER — SUGAMMADEX SODIUM 200 MG/2ML IV SOLN
INTRAVENOUS | Status: DC | PRN
  Administered 2021-09-16: 100 mg via INTRAVENOUS

## 2021-09-16 MED ORDER — 0.9 % SODIUM CHLORIDE (POUR BTL) OPTIME
TOPICAL | Status: DC | PRN
  Administered 2021-09-16: 5000 mL

## 2021-09-16 MED ORDER — METHOCARBAMOL 500 MG PO TABS
500.0000 mg | ORAL_TABLET | Freq: Four times a day (QID) | ORAL | Status: DC | PRN
  Administered 2021-09-16 – 2021-09-17 (×3): 500 mg via ORAL
  Filled 2021-09-16 (×3): qty 1

## 2021-09-16 MED ORDER — ROCURONIUM BROMIDE 10 MG/ML (PF) SYRINGE
PREFILLED_SYRINGE | INTRAVENOUS | Status: AC
  Filled 2021-09-16: qty 10

## 2021-09-16 MED ORDER — OXYCODONE HCL 5 MG PO TABS
5.0000 mg | ORAL_TABLET | ORAL | Status: DC | PRN
  Administered 2021-09-16 – 2021-09-17 (×3): 10 mg via ORAL
  Filled 2021-09-16 (×3): qty 2

## 2021-09-16 MED ORDER — POVIDONE-IODINE 7.5 % EX SOLN
Freq: Once | CUTANEOUS | Status: DC
  Filled 2021-09-16: qty 118

## 2021-09-16 MED ORDER — TRANEXAMIC ACID-NACL 1000-0.7 MG/100ML-% IV SOLN
1000.0000 mg | INTRAVENOUS | Status: AC
  Administered 2021-09-16: 1000 mg via INTRAVENOUS

## 2021-09-16 MED ORDER — LACTATED RINGERS IV SOLN: INTRAVENOUS | Status: DC

## 2021-09-16 MED ORDER — VANCOMYCIN HCL 1000 MG IV SOLR
INTRAVENOUS | Status: AC
  Filled 2021-09-16: qty 20

## 2021-09-16 MED ORDER — DOCUSATE SODIUM 100 MG PO CAPS
100.0000 mg | ORAL_CAPSULE | Freq: Two times a day (BID) | ORAL | Status: DC
  Filled 2021-09-16 (×2): qty 1

## 2021-09-16 MED ORDER — PROPOFOL 10 MG/ML IV BOLUS
INTRAVENOUS | Status: AC
  Filled 2021-09-16: qty 20

## 2021-09-16 MED ORDER — DEXAMETHASONE SODIUM PHOSPHATE 10 MG/ML IJ SOLN
INTRAMUSCULAR | Status: AC
  Filled 2021-09-16: qty 1

## 2021-09-16 MED ORDER — TRANEXAMIC ACID-NACL 1000-0.7 MG/100ML-% IV SOLN
INTRAVENOUS | Status: AC
  Filled 2021-09-16: qty 100

## 2021-09-16 MED ORDER — GABAPENTIN 300 MG PO CAPS
600.0000 mg | ORAL_CAPSULE | Freq: Two times a day (BID) | ORAL | Status: DC
  Administered 2021-09-16: 600 mg via ORAL
  Filled 2021-09-16: qty 2

## 2021-09-16 MED ORDER — CEFAZOLIN SODIUM-DEXTROSE 2-4 GM/100ML-% IV SOLN
INTRAVENOUS | Status: AC
  Filled 2021-09-16: qty 100

## 2021-09-16 MED ORDER — LIDOCAINE 2% (20 MG/ML) 5 ML SYRINGE
INTRAMUSCULAR | Status: AC
  Filled 2021-09-16: qty 5

## 2021-09-16 MED ORDER — ACETAMINOPHEN 325 MG PO TABS: 325.0000 mg | ORAL_TABLET | Freq: Four times a day (QID) | ORAL | Status: DC | PRN

## 2021-09-16 MED ORDER — ONDANSETRON HCL 4 MG/2ML IJ SOLN
INTRAMUSCULAR | Status: AC
  Filled 2021-09-16: qty 2

## 2021-09-16 MED ORDER — CELECOXIB 200 MG PO CAPS
ORAL_CAPSULE | ORAL | Status: AC
  Filled 2021-09-16: qty 1

## 2021-09-16 MED ORDER — ASPIRIN 81 MG PO TBEC
81.0000 mg | DELAYED_RELEASE_TABLET | Freq: Every day | ORAL | Status: DC
  Administered 2021-09-16: 81 mg via ORAL
  Filled 2021-09-16: qty 1

## 2021-09-16 MED ORDER — HYDROMORPHONE HCL 1 MG/ML IJ SOLN: 0.5000 mg | INTRAMUSCULAR | Status: DC | PRN

## 2021-09-16 MED ORDER — CEFAZOLIN SODIUM-DEXTROSE 2-4 GM/100ML-% IV SOLN
2.0000 g | Freq: Three times a day (TID) | INTRAVENOUS | Status: AC
  Administered 2021-09-16 – 2021-09-17 (×3): 2 g via INTRAVENOUS
  Filled 2021-09-16 (×3): qty 100

## 2021-09-16 MED ORDER — SODIUM CHLORIDE 0.9 % IV SOLN: INTRAVENOUS | Status: AC

## 2021-09-16 SURGICAL SUPPLY — 81 items
AID PSTN UNV HD RSTRNT DISP (MISCELLANEOUS) ×1
ALCOHOL 70% 16 OZ (MISCELLANEOUS) ×3 IMPLANT
APL PRP STRL LF DISP 70% ISPRP (MISCELLANEOUS) ×1
BAG COUNTER SPONGE SURGICOUNT (BAG) ×3 IMPLANT
BAG SPNG CNTER NS LX DISP (BAG) ×1
BIT DRILL 2.7 W/STOP DISP (BIT) ×1 IMPLANT
BIT DRILL QUICK REL 1/8 2PK SL (DRILL) IMPLANT
BIT DRILL TWIST 2.7 (BIT) ×1 IMPLANT
BLADE SAW SGTL 13X75X1.27 (BLADE) ×3 IMPLANT
BRNG HUM +3 36 RVRS SHLDR (Shoulder) ×1 IMPLANT
BSPLAT GLND LRG AUG TPR ADPR (Joint) ×1 IMPLANT
CHLORAPREP W/TINT 26 (MISCELLANEOUS) ×3 IMPLANT
COMP REV AUG LG W/TAPER/GLENOI (Joint) ×2 IMPLANT
COMPONENT RV AUG LG W/TAPR/GLN (Joint) IMPLANT
COOLER ICEMAN CLASSIC (MISCELLANEOUS) ×3 IMPLANT
COVER SURGICAL LIGHT HANDLE (MISCELLANEOUS) ×3 IMPLANT
DRAPE INCISE IOBAN 66X45 STRL (DRAPES) ×3 IMPLANT
DRAPE U-SHAPE 47X51 STRL (DRAPES) ×6 IMPLANT
DRILL QUICK RELEASE 1/8 INCH (DRILL) ×2
DRSG AQUACEL AG ADV 3.5X10 (GAUZE/BANDAGES/DRESSINGS) ×1 IMPLANT
ELECT BLADE 4.0 EZ CLEAN MEGAD (MISCELLANEOUS) ×2
ELECT REM PT RETURN 9FT ADLT (ELECTROSURGICAL) ×2
ELECTRODE BLDE 4.0 EZ CLN MEGD (MISCELLANEOUS) ×2 IMPLANT
ELECTRODE REM PT RTRN 9FT ADLT (ELECTROSURGICAL) ×2 IMPLANT
GLENOID SPHERE 36MM CVD +3 (Orthopedic Implant) ×1 IMPLANT
GLOVE BIOGEL PI IND STRL 7.0 (GLOVE) ×2 IMPLANT
GLOVE BIOGEL PI IND STRL 7.5 (GLOVE) IMPLANT
GLOVE BIOGEL PI IND STRL 8 (GLOVE) ×2 IMPLANT
GLOVE BIOGEL PI INDICATOR 7.0 (GLOVE) ×1
GLOVE BIOGEL PI INDICATOR 7.5 (GLOVE) ×1
GLOVE BIOGEL PI INDICATOR 8 (GLOVE) ×1
GLOVE ECLIPSE 7.0 STRL STRAW (GLOVE) ×3 IMPLANT
GLOVE ECLIPSE 8.0 STRL XLNG CF (GLOVE) ×3 IMPLANT
GLOVE SURG SS PI 7.5 STRL IVOR (GLOVE) ×1 IMPLANT
GOWN STRL REUS W/ TWL LRG LVL3 (GOWN DISPOSABLE) ×2 IMPLANT
GOWN STRL REUS W/ TWL XL LVL3 (GOWN DISPOSABLE) ×2 IMPLANT
GOWN STRL REUS W/TWL LRG LVL3 (GOWN DISPOSABLE) ×2
GOWN STRL REUS W/TWL XL LVL3 (GOWN DISPOSABLE) ×4
GUIDE MODEL REV SHLD LT (ORTHOPEDIC DISPOSABLE SUPPLIES) ×1 IMPLANT
HYDROGEN PEROXIDE 16OZ (MISCELLANEOUS) ×3 IMPLANT
JET LAVAGE IRRISEPT WOUND (IRRIGATION / IRRIGATOR) ×2
KIT BASIN OR (CUSTOM PROCEDURE TRAY) ×3 IMPLANT
KIT TURNOVER KIT B (KITS) ×3 IMPLANT
LAVAGE JET IRRISEPT WOUND (IRRIGATION / IRRIGATOR) ×2 IMPLANT
LOOP VESSEL MAXI BLUE (MISCELLANEOUS) ×3 IMPLANT
MANIFOLD NEPTUNE II (INSTRUMENTS) ×3 IMPLANT
NDL SUT 6 .5 CRC .975X.05 MAYO (NEEDLE) IMPLANT
NDL TAPERED W/ NITINOL LOOP (MISCELLANEOUS) ×2 IMPLANT
NEEDLE MAYO TAPER (NEEDLE)
NEEDLE TAPERED W/ NITINOL LOOP (MISCELLANEOUS) ×2 IMPLANT
NS IRRIG 1000ML POUR BTL (IV SOLUTION) ×3 IMPLANT
PACK SHOULDER (CUSTOM PROCEDURE TRAY) ×3 IMPLANT
PAD ARMBOARD 7.5X6 YLW CONV (MISCELLANEOUS) ×6 IMPLANT
PIN HUMERAL STMN 3.2MMX9IN (INSTRUMENTS) ×2 IMPLANT
REAMER GUIDE BUSHING SURG DISP (MISCELLANEOUS) ×1 IMPLANT
REAMER GUIDE W/SCREW AUG (MISCELLANEOUS) ×1 IMPLANT
RESTRAINT HEAD UNIVERSAL NS (MISCELLANEOUS) ×3 IMPLANT
SCREW BONE STRL 6.5MMX30MM (Screw) ×1 IMPLANT
SCREW LOCKING 4.75MMX15MM (Screw) ×1 IMPLANT
SCREW LOCKING NS 4.75MMX20MM (Screw) ×3 IMPLANT
SLING ARM IMMOBILIZER LRG (SOFTGOODS) ×1 IMPLANT
SOL PREP POV-IOD 4OZ 10% (MISCELLANEOUS) ×3 IMPLANT
SPONGE T-LAP 18X18 ~~LOC~~+RFID (SPONGE) ×3 IMPLANT
STEM HUMERAL STRL 11MMX83MM (Stem) ×1 IMPLANT
STRIP CLOSURE SKIN 1/2X4 (GAUZE/BANDAGES/DRESSINGS) ×2 IMPLANT
SUCTION FRAZIER HANDLE 10FR (MISCELLANEOUS) ×2
SUCTION TUBE FRAZIER 10FR DISP (MISCELLANEOUS) ×2 IMPLANT
SUT BROADBAND TAPE 2PK 1.5 (SUTURE) ×3 IMPLANT
SUT MNCRL AB 3-0 PS2 18 (SUTURE) ×3 IMPLANT
SUT SILK 2 0 TIES 10X30 (SUTURE) ×3 IMPLANT
SUT VIC AB 0 CT1 27 (SUTURE) ×6
SUT VIC AB 0 CT1 27XBRD ANBCTR (SUTURE) ×8 IMPLANT
SUT VIC AB 1 CT1 27 (SUTURE) ×4
SUT VIC AB 1 CT1 27XBRD ANBCTR (SUTURE) ×4 IMPLANT
SUT VIC AB 1 CT1 36 (SUTURE) ×3 IMPLANT
SUT VIC AB 2-0 CT1 27 (SUTURE) ×6
SUT VIC AB 2-0 CT1 TAPERPNT 27 (SUTURE) ×6 IMPLANT
SUT VICRYL 0 UR6 27IN ABS (SUTURE) ×9 IMPLANT
TOWEL GREEN STERILE (TOWEL DISPOSABLE) ×3 IMPLANT
TRAY HUM REV SHOULDER 36 +3 (Shoulder) ×1 IMPLANT
TRAY HUM REV SHOULDER STD +6 (Shoulder) ×1 IMPLANT

## 2021-09-16 NOTE — Transfer of Care (Signed)
Immediate Anesthesia Transfer of Care Note  Patient: Victor Ramirez  Procedure(s) Performed: LEFT REVERSE SHOULDER ARTHROPLASTY (Left: Shoulder)  Patient Location: PACU  Anesthesia Type:GA combined with regional for post-op pain  Level of Consciousness: awake, patient cooperative and responds to stimulation  Airway & Oxygen Therapy: Patient Spontanous Breathing and Patient connected to nasal cannula oxygen  Post-op Assessment: Report given to RN and Post -op Vital signs reviewed and stable  Post vital signs: Reviewed and stable  Last Vitals:  Vitals Value Taken Time  BP    Temp    Pulse 94 09/16/21 1123  Resp    SpO2 97 % 09/16/21 1123  Vitals shown include unvalidated device data.  Last Pain:  Vitals:   09/16/21 0605  TempSrc: Oral  PainSc: 0-No pain         Complications: No notable events documented.

## 2021-09-16 NOTE — Anesthesia Postprocedure Evaluation (Signed)
Anesthesia Post Note  Patient: Victor Ramirez  Procedure(s) Performed: LEFT REVERSE SHOULDER ARTHROPLASTY (Left: Shoulder)     Patient location during evaluation: PACU Anesthesia Type: General Level of consciousness: sedated and patient cooperative Pain management: pain level controlled Vital Signs Assessment: post-procedure vital signs reviewed and stable Respiratory status: spontaneous breathing Cardiovascular status: stable Anesthetic complications: no   No notable events documented.  Last Vitals:  Vitals:   09/16/21 1255 09/16/21 1330  BP: (!) 149/97 (!) 153/97  Pulse: 94 90  Resp:  17  Temp:  37.1 C  SpO2: 97% 98%    Last Pain:  Vitals:   09/16/21 1255  TempSrc:   PainSc: 0-No pain                 Lewie Loron

## 2021-09-16 NOTE — Anesthesia Procedure Notes (Addendum)
Procedure Name: Intubation Date/Time: 09/16/2021 7:39 AM  Performed by: Michele Rockers, CRNAPre-anesthesia Checklist: Patient identified, Patient being monitored, Timeout performed, Emergency Drugs available and Suction available Patient Re-evaluated:Patient Re-evaluated prior to induction Oxygen Delivery Method: Circle system utilized Preoxygenation: Pre-oxygenation with 100% oxygen Induction Type: IV induction Ventilation: Mask ventilation without difficulty and Oral airway inserted - appropriate to patient size Laryngoscope Size: Mac and 3 Grade View: Grade I Tube type: Oral Tube size: 7.5 mm Number of attempts: 3 (laryngoscope handle light was malfunctioning) Airway Equipment and Method: Stylet Placement Confirmation: ETT inserted through vocal cords under direct vision, positive ETCO2 and breath sounds checked- equal and bilateral Secured at: 22 cm Tube secured with: Tape Dental Injury: Teeth and Oropharynx as per pre-operative assessment

## 2021-09-16 NOTE — Op Note (Signed)
NAMEBURDELL, PEED MEDICAL RECORD NO: [id number] ACCOUNT NO: [id number] DATE OF BIRTH: [birth year] FACILITY: [facility] LOCATION: [facility] PHYSICIAN: Graylin Shiver. August Saucer, MD  Operative Report   DATE OF PROCEDURE: 09/16/2021  PREOPERATIVE DIAGNOSIS:  Left shoulder arthritis.  POSTOPERATIVE DIAGNOSIS:  Left shoulder arthritis.  PROCEDURE:  Left reverse shoulder replacement using [employer] components, large augmented baseplate with 1 central compression screw, 4 peripheral locking screws, glenosphere 36 mm +3 offset with mini humeral stem, 11 mm with mini humeral tray, +6 offset  and +3 thickness.  SURGEON:  Graylin Shiver. August Saucer, MD  ASSISTANT:  Victor Cai, PA.  INDICATIONS:  Victor Ramirez is a 66 year old patient with end-stage left shoulder arthritis with some rotator cuff degeneration, who presents for operative management after explanation of risks and benefits.  DESCRIPTION OF PROCEDURE:  The patient was brought to the operating room where general endotracheal anesthesia was induced.  Preoperative antibiotics administered.  Timeout was called.  The patient was placed in the beach chair position with the head in  neutral position.  Left arm examined under anesthesia and found to have passive range of motion of 45/80/140.  The patient's left shoulder, arm and hand was pre-scrubbed with hydrogen peroxide, which was allowed to air dry, then alcohol and Betadine,  which was allowed to air dry, then prepped with ChloraPrep solution and draped in a sterile manner.  Victor Ramirez was used to seal the operative field.  Timeout was called.  Deltopectoral approach was made.  IrriSept solution utilized. Cephalic vein mobilized  laterally.  Crossing veins tied with silk ties, anterior portion of the deltoid attachment released manually to diminish tension on the deltoid.  This was released as a sleeve with overlying tissue.  Kolbel retractor placed.  Axillary nerve palpated and  visualized and protected at all times.  Subdeltoid  adhesions released.  CA ligament partially released.  Rotator cuff, subscapularis was intact.  There was some thinning of the posterior superior rotator cuff, infraspinatus and supraspinatus.  Circumflex  vessels were ligated.  Subscapularis was detached from the lesser tuberosity using a 15 blade.  Soft tissue capsular dissection continued about 2 cm inferior to the anterior inferior humeral neck.  This was performed with a Cobb elevator. Subscapularis  and capsule were then detached around to the 7 o'clock position in this left shoulder.  Next, the shoulder was dislocated.  Tagging sutures placed on the subscap and capsule.  Next, reaming was performed up to a size 11. Head was cut and alignment with  its native retroversion which is about 30 degrees.  Broaching then performed up to a size 11 as well with a good bone stock encountered.  A cap was placed.  Attention then directed towards the glenoid.  Posterior and anterior retractors were placed.   Labrum was circumferentially excised along with the biceps tendon which had earlier been tenodesed to the pec tendon using 5-0 Vicryl sutures.  Rotator interval was opened and the coracohumeral ligament was released prior to dislocating the shoulder.   After dislocating reaming and broaching and capping via 11 mm broach posterior and anterior retractors were placed.  The labrum was circumferentially excised with care being taken to avoid injury to the axillary nerve.  Bankart lesion created from the 12  o'clock to 6 o'clock position.  Adequate exposure of the glenoid was obtained.  Using patient-specific instrumentation and guides, the guide pins were placed into the glenoid.  Correct location confirmed manually.  Reaming was performed including  initial reaming as well as reaming  for the augment.  The trial augment sat nicely with good contact on the cut surface.  Next, the baseplate was placed with 1 central compression screw, which obtained very good purchase  and 4 peripheral locking screws.  Next, the trial reduction was performed with both a 36 standard and 36+3 along on the glenoid side and then +3 offset +6 offset on the humeral side.  The best stability combination was the +3 on the glenoid side and +6 offset on the humeral tray with +3  thickness polyethylene liner.  This gave very good stability to extension and adduction.  Soft tissues not overly tight.  Axillary nerve palpated and not overly tight.  Shoulder was very stable with internal and external rotation along with forward  flexion.  The reduction was "two fingers tight."  Trial components removed on both sides.  The true glenosphere was placed.  Six suture tapes were then drilled into the lesser tuberosity.  Thorough irrigation was performed with IrriSept solution, both in  the canal as well as within the joint.  After placing the glenosphere, the humeral stem was placed.  The reduction was excellent.  Thorough irrigation then again performed and the subscapularis was repaired with the arm in 30 degrees of external  rotation using the 6 SutureTapes and Nice knots.  The rotator interval was then closed using #1 Vicryl suture with the arm in 30 degrees of external rotation.  Prior to rotator interval closure IrriSept was used to irrigate the joint and vancomycin  powder placed onto the prosthesis and then the rotator interval was closed.  Next, the pouring irrigation utilized x4 liters which was done both before closure of the subscap as well as after closure of the subscap and placement of vancomycin powder  within the joint.  Deltopectoral interval was then closed using #1 Vicryl suture followed by interrupted inverted 0 Vicryl suture, 2-0 Vicryl suture, and 3-0 Monocryl with Steri-Strips and Aquacel dressing applied.  Shoulder immobilizer applied.  Victor Ramirez's  assistance was required at all times for retraction, opening, closing, drilling, mobilization of tissue.  His assistance was a medical  necessity.   SHY D: 09/16/2021 11:30:00 am T: 09/16/2021 11:59:00 am  JOB: [id number]/ [id number]

## 2021-09-16 NOTE — Anesthesia Procedure Notes (Signed)
Anesthesia Regional Block: Interscalene brachial plexus block   Pre-Anesthetic Checklist: , timeout performed,  Correct Patient, Correct Site, Correct Laterality,  Correct Procedure, Correct Position, site marked,  Risks and benefits discussed,  Surgical consent,  Pre-op evaluation,  At surgeon's request and post-op pain management  Laterality: Upper and Left  Prep: chloraprep       Needles:  Injection technique: Single-shot  Needle Type: Stimulator Needle - 40     Needle Length: 4cm  Needle Gauge: 22     Additional Needles:   Procedures:,,,, ultrasound used (permanent image in chart),,    Narrative:  Start time: 09/16/2021 6:58 AM End time: 09/16/2021 7:18 AM Injection made incrementally with aspirations every 5 mL.  Performed by: Personally  Anesthesiologist: Lewie Loron, MD  Additional Notes: BP cuff, SpO2 and EKG monitors applied. Sedation begun. Nerve location verified with ultrasound. Anesthetic injected incrementally, slowly, and after neg aspirations under direct u/s guidance. Good perineural spread. Tolerated well.

## 2021-09-16 NOTE — H&P (Addendum)
Victor Ramirez is an 66 y.o. male.   Chief Complaint: Left shoulder pain HPI:  Victor Ramirez is a 66 year old patient with left shoulder pain.  Since he was last seen he has had a left shoulder MRI arthrogram as well as C-spine MRI scan.  Pain radiates from the shoulder but not below the elbow.  Just reports pain.  Not much in the way of numbness and tingling.  He has been on Norco for his back pain for several years.  He does take steroids for his PMR.  5 mg a day.  Reasonably low demand on the left shoulder.  Left shoulder injection previously only gave him 1 to 2 days of relief.  MRI scan of the cervical spine shows multilevel moderate neuroforaminal narrowing at several levels.  No significant high-grade spinal canal stenosis at any level.  MRI of the shoulder on the left-hand side demonstrates tendinopathy of the supraspinatus with high-grade partial-thickness articular surface tearing around the footprint.  This is also the case for the infraspinatus and supraspinatus.  Intrasubstance tearing of the long head of the biceps is present along with moderate glenohumeral osteoarthritis with significant cartilage loss on both the glenoid and humeral head side.  Past Medical History:  Diagnosis Date   Arthritis    Hemochromatosis    Hypertension     Past Surgical History:  Procedure Laterality Date   ANTERIOR LATERAL LUMBAR FUSION WITH PERCUTANEOUS SCREW 1 LEVEL Right 11/27/2016   Procedure: Lumbar Three-Four Transpsoas Lumbar InterbodyFfusion;  Surgeon: Ditty, Loura Halt, MD;  Location: [facility] OR;  Service: Neurosurgery;  Laterality: Right;  L3-4 Transpsoas lumbar interbody fusion/L3-4 Pedicle screw fixation with posterolateral arthrodesis/minimally invasive decompression/Mazor   APPLICATION OF ROBOTIC ASSISTANCE FOR SPINAL PROCEDURE N/A 11/27/2016   Procedure: Lumbar Three-Four Pedicle Screw Fixation with Posterolateral Arthrodesis with Minimally Invasive Decompression with Application of Robotic  Assistance;  Surgeon: Ditty, Loura Halt, MD;  Location: [facility] OR;  Service: Neurosurgery;  Laterality: N/A;   BACK SURGERY     x3    BACK SURGERY     battery surgery    spinal manipulation under anesthesia     TONSILLECTOMY      Family History  Problem Relation Age of Onset   Arthritis Mother    Thyroid cancer Mother    Melanoma Father    Fibromyalgia Sister    Cancer Brother    Prostate cancer Brother    Williams syndrome Son    Social History:  reports that he quit smoking about 16 years ago. His smoking use included cigarettes. He has a 70.00 pack-year smoking history. He has never used smokeless tobacco. He reports current alcohol use of about 9.0 standard drinks of alcohol per week. He reports that he does not use drugs.  Allergies:  Allergies  Allergen Reactions   Bee Venom Anaphylaxis    Medications Prior to Admission  Medication Sig Dispense Refill   buPROPion (WELLBUTRIN XL) 150 MG 24 hr tablet Take 150 mg by mouth every morning.     cetirizine (ZYRTEC) 10 MG tablet Take 10 mg by mouth daily.     EPINEPHrine (EPIPEN JR) 0.15 MG/0.3ML injection Inject 0.15 mg into the muscle as needed for anaphylaxis.     fluticasone (FLONASE) 50 MCG/ACT nasal spray Place 2 sprays into both nostrils daily.     gabapentin (NEURONTIN) 300 MG capsule Take 1 capsule (300 mg total) by mouth 3 (three) times daily. (Patient taking differently: Take 600 mg by mouth 2 (two) times daily.) 90 capsule  2   HYDROcodone-acetaminophen (NORCO) 7.5-325 MG tablet Take 0.5-1 tablets by mouth every 8 (eight) hours as needed for moderate pain.     lisinopril (PRINIVIL,ZESTRIL) 40 MG tablet Take 40 mg by mouth daily.     methocarbamol (ROBAXIN) 750 MG tablet Take 750 mg by mouth 2 (two) times daily.     predniSONE (DELTASONE) 5 MG tablet TAKE 1 TABLET(5 MG) BY MOUTH DAILY WITH BREAKFAST 30 tablet 2   rosuvastatin (CRESTOR) 5 MG tablet Take 5 mg by mouth daily.     diazepam (VALIUM) 10 MG tablet 1 po 1 hour  prior to MRI procedure. Repeat if needed. (Patient not taking: Reported on 08/29/2021) 2 tablet 0   diclofenac Sodium (VOLTAREN) 1 % GEL Apply 2 g topically daily as needed (arthritis).      No results found for this or any previous visit (from the past 48 hour(s)). No results found.  Review of Systems  Musculoskeletal:  Positive for arthralgias.  All other systems reviewed and are negative.   Blood pressure 114/90, pulse 97, temperature 98.1 F (36.7 C), temperature source Oral, resp. rate 18, height 5\' 8"  (1.727 m), weight 86.2 kg, SpO2 99 %. Physical Exam Vitals reviewed.  HENT:     Head: Normocephalic.     Nose: Nose normal.     Mouth/Throat:     Mouth: Mucous membranes are moist.  Cardiovascular:     Rate and Rhythm: Normal rate.     Pulses: Normal pulses.  Pulmonary:     Effort: Pulmonary effort is normal.  Abdominal:     General: Abdomen is flat.  Musculoskeletal:     Cervical back: Normal range of motion.  Skin:    General: Skin is warm.     Capillary Refill: Capillary refill takes less than 2 seconds.  Neurological:     General: No focal deficit present.     Mental Status: He is alert.  Psychiatric:        Mood and Affect: Mood normal.    Ortho exam demonstrates good cervical spine range of motion.  5 out of 5 grip EPL FPL interosseous resection extension bicep tricep and deltoid strength.  Patient's skin in the shoulder and arm region is consistent with steroid type skin even though he is only been on steroids for about 8 months.  Deltoid is functional.  Passive range of motion on the left is 35/80/130 with pain.  Slightly weaker infraspinatus supraspinatus testing on the left compared to the right.  Subscap strength 5+ out of 5.  No discrete AC joint tenderness left versus right.  Assessment/Plan  Impression is severe left shoulder pain with significant arthritis as well as tendinopathy involving the posterior superior rotator cuff.  His best chance for pain relief  as well as some functional improvement is shoulder replacement.  Based on his steroid use and relatively low demands he is putting on the shoulder as well as the presence of significant partial-thickness tearing of the rotator cuff posteriorly superiorly I think a reverse replacement is his best option.  We can do this with bone preserving implants on the glenoid side.  Concern with total shoulder replacement would be the integrity of the rotator cuff and possibility of revision within 3 to 5 years after primary which in his case being on chronic steroid use and based on the appearance of his skin today would not be ideal.  Essentially aiming for 1 surgery to take care of the problem with minimal chance that requiring  revision surgery.  The risk and benefits of reverse replacement are discussed with the patient with the use of models.  They include but not limited to infection nerve vessel damage incomplete restoration of function as well as incomplete pain relief as well as dislocation.  Patient understands risk benefits and wishes to proceed.  Thin cut CT scan pending for patient-specific instrumentation has been done to optimize implant position and improve longevity.  Patient also has cervical spine foraminal stenosis at multiple levels.  However, he did have good relief but short-term from intra-articular glenohumeral joint injection.  We may consider cervical ESI's several months after his shoulder replacement surgery.  Plan to do this in early June.  All questions answered  Burnard Bunting, MD 09/16/2021, 6:27 AM  BMI: Estimated body mass index is 28.89 kg/m as calculated from the following:   Height as of this encounter: 5\' 8"  (1.727 m).   Weight as of this encounter: 86.2 kg.  Lab Results  Component Value Date   ALBUMIN 3.6 12/03/2020   Diabetes: Patient does not have a diagnosis of diabetes.     Smoking Status: Social History   Tobacco Use  Smoking Status Former   Packs/day: 2.00    Years: 35.00   Total pack years: 70.00   Types: Cigarettes   Quit date: 2007   Years since quitting: 16.5  Smokeless Tobacco Never   The patient is not currently a tobacco user. Counseling given: Not Answered

## 2021-09-16 NOTE — Progress Notes (Signed)
Pt seen in room, alert/oriented in no apparent distress, Pt in recliner chair sitting comfortably with sling to left arm, dresssing CDI. Menu provided with instructions. Pt verbalized understanding of instructions. Hospital valuables has been discussed with no complaints. Call bell/room phone within reach , with chair alarm on.

## 2021-09-16 NOTE — Brief Op Note (Signed)
   09/16/2021  11:21 AM  PATIENT:  Victor Ramirez  66 y.o. male  PRE-OPERATIVE DIAGNOSIS:  left shoulder osteoarthritis  POST-OPERATIVE DIAGNOSIS:  left shoulder osteoarthritis  PROCEDURE:  Procedure(s): LEFT REVERSE SHOULDER ARTHROPLASTY  SURGEON:  Surgeon(s): Cammy Copa, MD  ASSISTANT: magnant pa  ANESTHESIA:   general  EBL: 75 ml    Total I/O In: 1000 [I.V.:800; IV Piggyback:200] Out: 75 [Blood:75]  BLOOD ADMINISTERED: none  DRAINS: none   LOCAL MEDICATIONS USED:  vanco  SPECIMEN:  No Specimen  COUNTS:  YES  TOURNIQUET:  * No tourniquets in log *  DICTATION: .Other Dictation: Dictation Number [id number]  PLAN OF CARE: Admit for overnight observation  PATIENT DISPOSITION:  PACU - hemodynamically stable

## 2021-09-17 ENCOUNTER — Encounter (HOSPITAL_COMMUNITY): Payer: Self-pay | Admitting: Orthopedic Surgery

## 2021-09-17 DIAGNOSIS — Z79899 Other long term (current) drug therapy: Secondary | ICD-10-CM | POA: Diagnosis not present

## 2021-09-17 DIAGNOSIS — M19012 Primary osteoarthritis, left shoulder: Secondary | ICD-10-CM | POA: Diagnosis not present

## 2021-09-17 DIAGNOSIS — I10 Essential (primary) hypertension: Secondary | ICD-10-CM | POA: Diagnosis not present

## 2021-09-17 DIAGNOSIS — Z87891 Personal history of nicotine dependence: Secondary | ICD-10-CM | POA: Diagnosis not present

## 2021-09-17 MED ORDER — OXYCODONE HCL 5 MG PO TABS: 5.0000 mg | ORAL_TABLET | ORAL | 0 refills | Status: DC | PRN

## 2021-09-17 MED ORDER — ACETAMINOPHEN 500 MG PO TABS: 1000.0000 mg | ORAL_TABLET | Freq: Four times a day (QID) | ORAL | 0 refills | Status: DC

## 2021-09-17 MED ORDER — ASPIRIN 81 MG PO TBEC: 81.0000 mg | DELAYED_RELEASE_TABLET | Freq: Every day | ORAL | 0 refills | Status: DC

## 2021-09-17 NOTE — Progress Notes (Signed)
Physical Therapy Note/Sign Off  Patient Details Name: DAMAR PETIT MRN: [id number] DOB: [birth year]   Cancelled Treatment:    Reason Eval/Treat Not Completed: PT screened, no needs identified, will sign off  Van Clines, PT  Acute Rehabilitation Services Office [phone]   Levi Aland 09/17/2021, 8:24 AM

## 2021-09-17 NOTE — Progress Notes (Signed)
  Subjective: Victor Ramirez is a 66 y.o. male s/p left RSA.  They are POD 1.  Pt's pain is controlled.  No complaint of chest pain, shortness of breath, abdominal pain.  Patient performed well with occupational therapy earlier this morning.  He has been ambulatory without dizziness or lightheadedness.  Pain is controlled.  Block still in effect.  He is ready for discharge home..    Objective: Vital signs in last 24 hours: Temp:  [97.9 F (36.6 C)-98.7 F (37.1 C)] 97.9 F (36.6 C) (07/19 0815) Pulse Rate:  [81-110] 81 (07/19 0815) Resp:  [15-18] 17 (07/19 0815) BP: (117-155)/(85-100) 136/97 (07/19 0815) SpO2:  [93 %-100 %] 98 % (07/19 0815)  Intake/Output from previous day: 07/18 0701 - 07/19 0700 In: 1575.7 [P.O.:240; I.V.:835.8; IV Piggyback:499.9] Out: 1625 [Urine:1550; Blood:75] Intake/Output this shift: Total I/O In: -  Out: 200 [Urine:200]  Exam:  No gross blood or drainage overlying the dressing 2+ radial pulse Sensation intact distally in the left hand Able to extend the left Wrist.  Intact EPL, FPL, finger abduction, finger adduction.  Axillary nerve intact with deltoid firing.  Not able to perform bicep flexion yet.  Tricep extension intact.   Labs: No results for input(s): "HGB" in the last 72 hours. No results for input(s): "WBC", "RBC", "HCT", "PLT" in the last 72 hours. No results for input(s): "NA", "K", "CL", "CO2", "BUN", "CREATININE", "GLUCOSE", "CALCIUM" in the last 72 hours. No results for input(s): "LABPT", "INR" in the last 72 hours.  Assessment/Plan: Pt is POD 1 s/p left RSA    -Plan to discharge to home today  -No lifting with the operative arm    Joycie Peek Laurrie Toppin 09/17/2021, 8:26 AM

## 2021-09-17 NOTE — Evaluation (Signed)
Occupational Therapy Evaluation/Discharge Patient Details Name: Victor Ramirez MRN: [id number] DOB: 08-28-1955 Today's Date: 09/17/2021   History of Present Illness Pt is a 66 y/o male with progressive L shoulder pain that failed conservative measures. MRI showed tendinopathy of the supraspinatus with high-grade partial-thickness articular surface tearing. Pt opted for L reverse total shoulder arthroplasty on 7/18. PMH: hx of back surgeries, HTN, PMR, Hemochromatosis   Clinical Impression   PTA, pt lives with special needs son, typically active and independent in all daily tasks. Pt presents now s/p sx on nondominant UE with nerve block still in effect from elbow to shoulder. Educated re: shoulder precautions for ADLs, sling mgmt, AROM of hand/wrist/elbow and pendulum exercises. Recommended use of shower chair at home (already has this DME) and assist for heavier tasks such as bringing in groceries. Pt reports he will have adequate assist from family/friends. Pt able to complete ADLs with Modified Independence after minor cues to avoid L shoulder movement, ambulating Independently ad lib in room. No further skilled OT services needed at this time. Defer follow-up therapy needs to surgeon per shoulder protocol.       Recommendations for follow up therapy are one component of a multi-disciplinary discharge planning process, led by the attending physician.  Recommendations may be updated based on patient status, additional functional criteria and insurance authorization.   Follow Up Recommendations  Follow physician's recommendations for discharge plan and follow up therapies    Assistance Recommended at Discharge PRN  Patient can return home with the following      Functional Status Assessment  Patient has had a recent decline in their functional status and demonstrates the ability to make significant improvements in function in a reasonable and predictable amount of time.  Equipment  Recommendations  None recommended by OT    Recommendations for Other Services       Precautions / Restrictions Precautions Precautions: Shoulder Type of Shoulder Precautions: reverse total shoulder Shoulder Interventions: Shoulder sling/immobilizer;Off for dressing/bathing/exercises Precaution Booklet Issued: Yes (comment) Required Braces or Orthoses: Sling Restrictions Weight Bearing Restrictions: Yes LUE Weight Bearing: Non weight bearing Other Position/Activity Restrictions: no AROM of shoulder, ok for pendulums, external rotation 0-30*      Mobility Bed Mobility Overal bed mobility: Modified Independent                  Transfers Overall transfer level: Independent Equipment used: None                      Balance Overall balance assessment: No apparent balance deficits (not formally assessed)                                         ADL either performed or assessed with clinical judgement   ADL Overall ADL's : Modified independent                                       General ADL Comments: Educated regarding shoulder precautions for ADLs with minor cues for sequencing donning L UE first into shirt. Assisted in educating on sling mgmt and readjusted (waist and shoulder strap were around pt's neck and too tight - reports he woke up from sx like this). Pt able to don pants without issues, plans to wear gym shorts d/t difficulty  managing belt, etc.     Vision Baseline Vision/History: 1 Wears glasses Ability to See in Adequate Light: 0 Adequate Patient Visual Report: No change from baseline Vision Assessment?: No apparent visual deficits     Perception     Praxis      Pertinent Vitals/Pain Pain Assessment Pain Assessment: No/denies pain (nerve block still in effect elbow to shoulder)     Hand Dominance Right   Extremity/Trunk Assessment Upper Extremity Assessment Upper Extremity Assessment: LUE  deficits/detail LUE Deficits / Details: AROM hand, wrist WFL. elbow PROM WFL - nerve block in effect elbow to shoulder. LUE: Unable to fully assess due to immobilization   Lower Extremity Assessment Lower Extremity Assessment: Overall WFL for tasks assessed   Cervical / Trunk Assessment Cervical / Trunk Assessment: Normal   Communication Communication Communication: No difficulties   Cognition Arousal/Alertness: Awake/alert Behavior During Therapy: WFL for tasks assessed/performed Overall Cognitive Status: Within Functional Limits for tasks assessed                                       General Comments       Exercises Exercises: Shoulder Shoulder Exercises Pendulum Exercise: Left, 5 reps, Seated Shoulder External Rotation: PROM, Left (0-30*) Elbow Flexion: Left, 5 reps, AAROM, Seated Elbow Extension: Left, 5 reps, Seated, AAROM Wrist Flexion: AROM, Left, 5 reps Wrist Extension: AROM, Left, 5 reps Digit Composite Flexion: AROM, Left, 5 reps Composite Extension: AROM, Left, 5 reps   Shoulder Instructions Shoulder Instructions Donning/doffing shirt without moving shoulder: Supervision/safety Method for sponge bathing under operated UE: Supervision/safety Donning/doffing sling/immobilizer: Supervision/safety Correct positioning of sling/immobilizer: Independent Pendulum exercises (written home exercise program): Independent ROM for elbow, wrist and digits of operated UE: Independent Sling wearing schedule (on at all times/off for ADL's): Independent Proper positioning of operated UE when showering: Modified independent Positioning of UE while sleeping: Modified independent    Home Living Family/patient expects to be discharged to:: Private residence Living Arrangements: Children (special needs son (38 y/o)) Available Help at Discharge: Family;Friend(s);Available PRN/intermittently Type of Home: House Home Access: Stairs to enter [employer] of  Steps: 3 Entrance Stairs-Rails: Right;Left Home Layout: Two level;Able to live on main level with bedroom/bathroom     Bathroom Shower/Tub: Tub/shower unit   Bathroom Toilet: Standard     Home Equipment: Grab bars - tub/shower;Rolling Walker (2 wheels);Shower seat;Hand held shower head;Adaptive equipment Adaptive Equipment: Long-handled sponge        Prior Functioning/Environment Prior Level of Function : Independent/Modified Independent;Driving             Mobility Comments: no use of AD ADLs Comments: Independent in all daily tasks, on disability, enjoys guitar and motorcycles        OT Problem List:        OT Treatment/Interventions:      OT Goals(Current goals can be found in the care plan section) Acute Rehab OT Goals Patient Stated Goal: home this morning OT Goal Formulation: All assessment and education complete, DC therapy  OT Frequency:      Co-evaluation              AM-PAC OT "6 Clicks" Daily Activity     Outcome Measure Help from another person eating meals?: None Help from another person taking care of personal grooming?: None Help from another person toileting, which includes using toliet, bedpan, or urinal?: None Help from another person bathing (including  washing, rinsing, drying)?: None Help from another person to put on and taking off regular upper body clothing?: None Help from another person to put on and taking off regular lower body clothing?: None 6 Click Score: 24   End of Session Nurse Communication: Mobility status  Activity Tolerance: Patient tolerated treatment well Patient left: Other (comment) (ambulating in room)  OT Visit Diagnosis: Muscle weakness (generalized) (M62.81)                Time: 0720-0752 OT Time Calculation (min): 32 min Charges:  OT General Charges $OT Visit: 1 Visit OT Evaluation $OT Eval Low Complexity: 1 Low OT Treatments $Self Care/Home Management : 8-22 mins  Victor Ramirez, Victor Ramirez Acute Rehab  Services Office: [phone]   Victor Ramirez 09/17/2021, 8:17 AM

## 2021-09-18 ENCOUNTER — Encounter (HOSPITAL_COMMUNITY): Payer: Self-pay | Admitting: Orthopedic Surgery

## 2021-09-22 ENCOUNTER — Other Ambulatory Visit: Payer: Self-pay | Admitting: Orthopedic Surgery

## 2021-09-22 ENCOUNTER — Telehealth: Payer: Self-pay | Admitting: Orthopedic Surgery

## 2021-09-22 MED ORDER — OXYCODONE HCL 5 MG PO TABS: 5.0000 mg | ORAL_TABLET | Freq: Four times a day (QID) | ORAL | 0 refills | Status: DC | PRN

## 2021-09-22 NOTE — Telephone Encounter (Signed)
Home health nurse called back to follow up on the authorization of pain meds for Victor Ramirez. He has been out since Saturday.

## 2021-09-22 NOTE — Telephone Encounter (Signed)
Home health nurse Victor Ramirez called in stating patients primary doctor will not refill pain medication Hydrocodone 7.5 /325 3 times a day instead of oxycodone and would like to know if Victor Ramirez is going to be over his everyday pain management.

## 2021-09-22 NOTE — Telephone Encounter (Signed)
Patient stated he has been out of his oxy 5mg  since Friday. His PCP who is doing his pain mgmt for chronic back pain and PMR will not refill his normal script for hydrocodone 7.5/325 due to being in Dr 11-11-1981 post op care. He is asking for a refill on oxy 5mg .

## 2021-09-22 NOTE — Telephone Encounter (Signed)
Rf done thx

## 2021-09-23 ENCOUNTER — Ambulatory Visit
Admission: RE | Admit: 2021-09-23 | Discharge: 2021-09-23 | Disposition: A | Discharge status: Home / Self Care | Payer: Medicare Other | Source: Ambulatory Visit | Attending: Physician Assistant | Admitting: Physician Assistant

## 2021-09-23 ENCOUNTER — Other Ambulatory Visit: Payer: Self-pay | Admitting: Physician Assistant

## 2021-09-23 DIAGNOSIS — M545 Low back pain, unspecified: Secondary | ICD-10-CM

## 2021-09-23 DIAGNOSIS — M25512 Pain in left shoulder: Secondary | ICD-10-CM | POA: Diagnosis not present

## 2021-09-23 DIAGNOSIS — G8929 Other chronic pain: Secondary | ICD-10-CM | POA: Diagnosis not present

## 2021-09-23 NOTE — Telephone Encounter (Signed)
IC advised.  

## 2021-09-23 NOTE — Telephone Encounter (Signed)
Did we not  refill this yesterday?

## 2021-09-24 NOTE — Telephone Encounter (Signed)
Okay to refill thanks

## 2021-09-24 NOTE — Telephone Encounter (Signed)
Refusing Rx refill. Responded by other means

## 2021-09-26 ENCOUNTER — Telehealth: Payer: Self-pay | Admitting: Orthopedic Surgery

## 2021-09-26 NOTE — Telephone Encounter (Signed)
T called requesting refill of pain medication. Please send to [facility] on [address]. Pt phone number is [phone]

## 2021-09-27 ENCOUNTER — Other Ambulatory Visit: Payer: Self-pay | Admitting: Specialist

## 2021-09-27 MED ORDER — OXYCODONE HCL 5 MG PO TABS: 5.0000 mg | ORAL_TABLET | Freq: Four times a day (QID) | ORAL | 0 refills | Status: DC | PRN

## 2021-09-30 ENCOUNTER — Other Ambulatory Visit: Payer: Self-pay | Admitting: Surgical

## 2021-09-30 NOTE — Telephone Encounter (Signed)
Nitka sent in refill on 7/29

## 2021-10-01 ENCOUNTER — Ambulatory Visit (INDEPENDENT_AMBULATORY_CARE_PROVIDER_SITE_OTHER): Payer: Medicare Other

## 2021-10-01 ENCOUNTER — Encounter: Payer: Self-pay | Admitting: Orthopedic Surgery

## 2021-10-01 ENCOUNTER — Ambulatory Visit (INDEPENDENT_AMBULATORY_CARE_PROVIDER_SITE_OTHER): Payer: Medicare Other | Admitting: Surgical

## 2021-10-01 DIAGNOSIS — Z96612 Presence of left artificial shoulder joint: Secondary | ICD-10-CM

## 2021-10-01 NOTE — Progress Notes (Signed)
Post-Op Visit Note   Patient: Victor Ramirez           Date of Birth: 1955/04/22           MRN: [id number] Visit Date: 10/01/2021 PCP: Wilfrid Lund, PA   Assessment & Plan:  Chief Complaint:  Chief Complaint  Patient presents with   Left Shoulder - Routine Post Op    09/16/21 left RSA   Visit Diagnoses:  1. History of arthroplasty of left shoulder     Plan: Patient is a 66 year old male who presents s/p left reverse shoulder arthroplasty on 09/16/2021.  He is in sling.  Up to 60 degrees on CPM machine.  Takes pain medication about every 6 hours along with Tylenol and Robaxin.  Denies any fevers, chills, drainage, night sweats.  Pain is improving steadily.  Sleeps well at night for the most part.  No complaint of chest pain or shortness of breath.  On exam, has 10 degrees external rotation, 60 degrees abduction, 95 degrees forward flexion passively.  Axillary nerve intact with deltoid firing.  Subscapularis with 5 -/5 strength.  5/5 motor strength of bilateral EPL, FPL, finger abduction, finger abduction, pronation/supination, bicep, tricep, deltoid.  Radiographs of the left shoulder demonstrate left shoulder reverse shoulder prosthesis in good position and alignment without any complicating features.  Plan to refer patient to physical therapy after her trip to work with his preferred physical therapist.  Focus on passive and active range of motion of the left shoulder with deltoid isometrics.  Avoid passive external rotation past 30 degrees and okay for more rotator cuff strengthening around the 6-week mark.  Follow-up in 4 weeks or clinical recheck with Dr. Raphael Gibney.  Follow-Up Instructions: No follow-ups on file.   Orders:  Orders Placed This Encounter  Procedures   XR Shoulder Left   Ambulatory referral to Physical Therapy   No orders of the defined types were placed in this encounter.   Imaging: XR Shoulder Left  Result Date: 10/01/2021 AP axillary outlet radiographs left  shoulder reviewed.  Reverse shoulder prosthesis in good position alignment with no complicating features.   PMFS History: Patient Active Problem List   Diagnosis Date Noted   OA (osteoarthritis) of shoulder 09/16/2021   S/P reverse total shoulder arthroplasty, left 09/16/2021   Pain in right wrist 05/12/2021   RUQ abdominal pain    Acute pancreatitis without infection or necrosis 12/02/2020   Pain in left shoulder 11/28/2020   Rheumatoid factor positive 08/29/2020   Polymyalgia rheumatica (HCC) 08/08/2020   Polyarthralgia 08/08/2020   High risk medication use 08/08/2020   Lumbosacral spondylosis with radiculopathy 11/27/2016   Atrophy of muscle of right lower leg 02/04/2014   History of post-polio syndrome 02/02/2014   Pain of left lower extremity 02/02/2014   Chronic back pain 05/14/2013   HTN (hypertension) 05/14/2013   Headache 12/22/2011   Sleep apnea 11/17/2011   Elevation of level of transaminase or lactic acid dehydrogenase (LDH) 09/18/2010   Anxiety 09/08/2010   Tachycardia 09/08/2010   Hyperlipemia 03/01/2006   TIA (transient ischemic attack) 03/01/2006   Tobacco use disorder 03/01/2006   Depressive disorder 10/16/2005   Past Medical History:  Diagnosis Date   Arthritis    Hemochromatosis    Hypertension     Family History  Problem Relation Age of Onset   Arthritis Mother    Thyroid cancer Mother    Melanoma Father    Fibromyalgia Sister    Cancer Brother    Prostate  cancer Brother    Williams syndrome Son     Past Surgical History:  Procedure Laterality Date   ANTERIOR LATERAL LUMBAR FUSION WITH PERCUTANEOUS SCREW 1 LEVEL Right 11/27/2016   Procedure: Lumbar Three-Four Transpsoas Lumbar InterbodyFfusion;  Surgeon: Ditty, Loura Halt, MD;  Location: [facility] OR;  Service: Neurosurgery;  Laterality: Right;  L3-4 Transpsoas lumbar interbody fusion/L3-4 Pedicle screw fixation with posterolateral arthrodesis/minimally invasive decompression/Mazor   APPLICATION OF  ROBOTIC ASSISTANCE FOR SPINAL PROCEDURE N/A 11/27/2016   Procedure: Lumbar Three-Four Pedicle Screw Fixation with Posterolateral Arthrodesis with Minimally Invasive Decompression with Application of Robotic Assistance;  Surgeon: Ditty, Loura Halt, MD;  Location: [facility] OR;  Service: Neurosurgery;  Laterality: N/A;   BACK SURGERY     x3    BACK SURGERY     battery surgery    REVERSE SHOULDER ARTHROPLASTY Left 09/16/2021   Procedure: LEFT REVERSE SHOULDER ARTHROPLASTY;  Surgeon: Cammy Copa, MD;  Location: [facility] OR;  Service: Orthopedics;  Laterality: Left;   spinal manipulation under anesthesia     TONSILLECTOMY     Social History   Occupational History   Not on file  Tobacco Use   Smoking status: Former    Packs/day: 2.00    Years: 35.00    Total pack years: 70.00    Types: Cigarettes    Quit date: 2007    Years since quitting: 16.5   Smokeless tobacco: Never  Vaping Use   Vaping Use: Never used  Substance and Sexual Activity   Alcohol use: Yes    Alcohol/week: 9.0 standard drinks of alcohol    Types: 9 Cans of beer per week   Drug use: No   Sexual activity: Not on file

## 2021-10-01 NOTE — Discharge Summary (Signed)
Physician Discharge Summary      Patient ID: Victor Ramirez MRN: [id number] DOB/AGE: 10/23/1955 66 y.o.  Admit date: 09/16/2021 Discharge date: 09/17/2021  Admission Diagnoses:  Principal Problem:   OA (osteoarthritis) of shoulder Active Problems:   S/P reverse total shoulder arthroplasty, left   Discharge Diagnoses:  Same  Surgeries: Procedure(s): LEFT REVERSE SHOULDER ARTHROPLASTY on 09/16/2021   Consultants:   Discharged Condition: Stable  Hospital Course: Victor Ramirez is an 66 y.o. male who was admitted 09/16/2021 with a chief complaint of left shoulder pain, and found to have a diagnosis of left shoulder osteoarthritis.  They were brought to the operating room on 09/16/2021 and underwent the above named procedures.  Pt awoke from anesthesia without complication and was transferred to the floor. On POD1, patient's pain was controlled and he was able to mobilize without any difficulties.  No red flag symptoms.  He was discharged home on POD 1..  Pt will f/u with Dr. Marlou Sa in clinic in ~2 weeks.   Antibiotics given:  Anti-infectives (From admission, onward)    Start     Dose/Rate Route Frequency Ordered Stop   09/16/21 1445  ceFAZolin (ANCEF) IVPB 2g/100 mL premix        2 g 200 mL/hr over 30 Minutes Intravenous Every 8 hours 09/16/21 1346 09/17/21 0557   09/16/21 0937  vancomycin (VANCOCIN) powder  Status:  Discontinued          As needed 09/16/21 0937 09/16/21 1118   09/16/21 0615  ceFAZolin (ANCEF) IVPB 2g/100 mL premix        2 g 200 mL/hr over 30 Minutes Intravenous On call to O.R. 09/16/21 0600 09/16/21 0800   09/16/21 0557  ceFAZolin (ANCEF) 2-4 GM/100ML-% IVPB       Note to Pharmacy: Rocky Morel D: cabinet override      09/16/21 0557 09/16/21 0750     .  Recent vital signs:  Vitals:   09/17/21 0720 09/17/21 0815  BP:  (!) 136/97  Pulse:  81  Resp:  17  Temp:  97.9 F (36.6 C)  SpO2: 100% 98%    Recent laboratory studies:  Results for orders  placed or performed during the hospital encounter of 09/08/21  Surgical pcr screen   Specimen: Nasal Mucosa; Nasal Swab  Result Value Ref Range   MRSA, PCR NEGATIVE NEGATIVE   Staphylococcus aureus NEGATIVE NEGATIVE  CBC  Result Value Ref Range   WBC 11.4 (H) 4.0 - 10.5 K/uL   RBC 4.57 4.22 - 5.81 MIL/uL   Hemoglobin 14.8 13.0 - 17.0 g/dL   HCT 43.9 39.0 - 52.0 %   MCV 96.1 80.0 - 100.0 fL   MCH 32.4 26.0 - 34.0 pg   MCHC 33.7 30.0 - 36.0 g/dL   RDW 13.1 11.5 - 15.5 %   Platelets 284 150 - 400 K/uL   nRBC 0.0 0.0 - 0.2 %  Basic metabolic panel  Result Value Ref Range   Sodium 139 135 - 145 mmol/L   Potassium 4.8 3.5 - 5.1 mmol/L   Chloride 100 98 - 111 mmol/L   CO2 31 22 - 32 mmol/L   Glucose, Bld 102 (H) 70 - 99 mg/dL   BUN 12 8 - 23 mg/dL   Creatinine, Ser 0.80 0.61 - 1.24 mg/dL   Calcium 9.4 8.9 - 10.3 mg/dL   GFR, Estimated >60 >60 mL/min   Anion gap 8 5 - 15  Urinalysis, Complete w Microscopic Urine, Clean Catch  Result  Value Ref Range   Color, Urine YELLOW YELLOW   APPearance CLEAR CLEAR   Specific Gravity, Urine 1.020 1.005 - 1.030   pH 8.0 5.0 - 8.0   Glucose, UA NEGATIVE NEGATIVE mg/dL   Hgb urine dipstick NEGATIVE NEGATIVE   Bilirubin Urine NEGATIVE NEGATIVE   Ketones, ur NEGATIVE NEGATIVE mg/dL   Protein, ur NEGATIVE NEGATIVE mg/dL   Nitrite NEGATIVE NEGATIVE   Leukocytes,Ua NEGATIVE NEGATIVE   WBC, UA 0-5 0 - 5 WBC/hpf   Bacteria, UA NONE SEEN NONE SEEN   Squamous Epithelial / LPF 0-5 0 - 5   Mucus PRESENT     Discharge Medications:   Allergies as of 09/17/2021       Reactions   Bee Venom Anaphylaxis        Medication List     STOP taking these medications    HYDROcodone-acetaminophen 7.5-325 MG tablet Commonly known as: NORCO       TAKE these medications    acetaminophen 500 MG tablet Commonly known as: TYLENOL Take 2 tablets (1,000 mg total) by mouth every 6 (six) hours.   aspirin EC 81 MG tablet Take 1 tablet (81 mg total) by  mouth daily. Swallow whole.   buPROPion 150 MG 24 hr tablet Commonly known as: WELLBUTRIN XL Take 150 mg by mouth every morning.   cetirizine 10 MG tablet Commonly known as: ZYRTEC Take 10 mg by mouth daily.   EPINEPHrine 0.15 MG/0.3ML injection Commonly known as: EPIPEN JR Inject 0.15 mg into the muscle as needed for anaphylaxis.   fluticasone 50 MCG/ACT nasal spray Commonly known as: FLONASE Place 2 sprays into both nostrils daily.   gabapentin 300 MG capsule Commonly known as: NEURONTIN Take 1 capsule (300 mg total) by mouth 3 (three) times daily. What changed:  how much to take when to take this   lisinopril 40 MG tablet Commonly known as: ZESTRIL Take 40 mg by mouth daily.   methocarbamol 750 MG tablet Commonly known as: ROBAXIN Take 750 mg by mouth 2 (two) times daily.   predniSONE 5 MG tablet Commonly known as: DELTASONE TAKE 1 TABLET(5 MG) BY MOUTH DAILY WITH BREAKFAST   rosuvastatin 5 MG tablet Commonly known as: CRESTOR Take 5 mg by mouth daily.   Voltaren 1 % Gel Generic drug: diclofenac Sodium Apply 2 g topically daily as needed (arthritis).        Diagnostic Studies: XR Shoulder Left  Result Date: 10/01/2021 AP axillary outlet radiographs left shoulder reviewed.  Reverse shoulder prosthesis in good position alignment with no complicating features.  DG Lumbar Spine Complete  Result Date: 09/24/2021 CLINICAL DATA:  Low back pain EXAM: LUMBAR SPINE - COMPLETE 4+ VIEW COMPARISON:  02/11/2017 FINDINGS: There is posterior surgical fusion at the L3-L4 level. There is mild decrease in height of upper endplate of body of L1 vertebra. There is significant interval increase in degenerative changes at the L2-L3 level with disc space narrowing, bony spurs and facet hypertrophy. There is minimal retrolisthesis at L2-L3 level. There is anterolisthesis at the L5-S1 level with no significant change. Arterial calcifications are seen in the aorta and its major  branches. There is a metallic wire in the soft tissues posterior to the sacrum residual from previous intervention. IMPRESSION: There is mild 10-15% interval decrease in height of upper endplate of body of L1 vertebra which may suggest recent or old compression. There is significant interval worsening of degenerative changes at L2-L3 level. Previous surgical fusion at the L3-L4 level. There is  minimal retrolisthesis at the L2-L3 level and mild anterolisthesis at L5-S1 level. Electronically Signed   By: Ernie Avena M.D.   On: 09/24/2021 16:53   DG Shoulder Left Port  Result Date: 09/16/2021 CLINICAL DATA:  Status post left shoulder arthroplasty EXAM: LEFT SHOULDER COMPARISON:  05/12/2021 FINDINGS: There is interval reverse arthroplasty in left shoulder. There are pockets of air in the soft tissues. Degenerative changes are noted in the left [facility] joint with bony spurs. IMPRESSION: Status post left shoulder arthroplasty. Electronically Signed   By: Ernie Avena M.D.   On: 09/16/2021 14:08    Disposition: Discharge disposition: 01-Home or Self Care       Discharge Instructions     Call MD / Call 911   Complete by: As directed    If you experience chest pain or shortness of breath, CALL 911 and be transported to the hospital emergency room.  If you develope a fever above 101 F, pus (white drainage) or increased drainage or redness at the wound, or calf pain, call your surgeon's office.   Constipation Prevention   Complete by: As directed    Drink plenty of fluids.  Prune juice may be helpful.  You may use a stool softener, such as Colace (over the counter) 100 mg twice a day.  Use MiraLax (over the counter) for constipation as needed.   Diet - low sodium heart healthy   Complete by: As directed    Discharge instructions   Complete by: As directed    You may shower, dressing is waterproof.  Do not bathe or soak the operative shoulder in a tub, pool.  Use the CPM machine 3 times a day  for one hour each time.  No lifting with the operative shoulder. Continue use of the sling.  Follow-up with Dr. August Saucer in ~2 weeks on your given appointment date on 10/01/2021 at 10:45 AM.  We will remove your adhesive bandage at that time.  Call the office at [phone] with any questions or concerns.  Dental Antibiotics:  In most cases prophylactic antibiotics for Dental procdeures after total joint surgery are not necessary.  Exceptions are as follows:  1. History of prior total joint infection  2. Severely immunocompromised (Organ Transplant, cancer chemotherapy, Rheumatoid biologic meds such as Humera)  3. Poorly controlled diabetes (A1C &gt; 8.0, blood glucose over 200)  If you have one of these conditions, contact your surgeon for an antibiotic prescription, prior to your dental procedure.   Increase activity slowly as tolerated   Complete by: As directed    Post-operative opioid taper instructions:   Complete by: As directed    POST-OPERATIVE OPIOID TAPER INSTRUCTIONS: It is important to wean off of your opioid medication as soon as possible. If you do not need pain medication after your surgery it is ok to stop day one. Opioids include: Codeine, Hydrocodone(Norco, Vicodin), Oxycodone(Percocet, oxycontin) and hydromorphone amongst others.  Long term and even short term use of opiods can cause: Increased pain response Dependence Constipation Depression Respiratory depression And more.  Withdrawal symptoms can include Flu like symptoms Nausea, vomiting And more Techniques to manage these symptoms Hydrate well Eat regular healthy meals Stay active Use relaxation techniques(deep breathing, meditating, yoga) Do Not substitute Alcohol to help with tapering If you have been on opioids for less than two weeks and do not have pain than it is ok to stop all together.  Plan to wean off of opioids This plan should start within one week post op  of your joint  replacement. Maintain the same interval or time between taking each dose and first decrease the dose.  Cut the total daily intake of opioids by one tablet each day Next start to increase the time between doses. The last dose that should be eliminated is the evening dose.             SignedDonella Stade 10/01/2021, 8:40 PM

## 2021-10-02 ENCOUNTER — Other Ambulatory Visit: Payer: Self-pay | Admitting: Specialist

## 2021-10-02 ENCOUNTER — Telehealth: Payer: Self-pay | Admitting: Orthopedic Surgery

## 2021-10-02 NOTE — Telephone Encounter (Signed)
Would like refill on oxycodone 

## 2021-10-02 NOTE — Progress Notes (Signed)
Office Visit Note  Patient: Victor Ramirez             Date of Birth: [birth year]           MRN: [id number]             PCP: Wilfrid Lund, PA Referring: Wilfrid Lund, [place] Visit Date: 10/15/2021   Subjective:  Follow-up (Bilateral shoulder pain, neck pain, and wrist pain.)   History of Present Illness: TAI Victor Ramirez is a 66 y.o. male here for follow up or PMR with multiple joint pains. He is having a good benefit in symptoms when taking the prednisone but does not do well with any further dose reduction. He has had some shoulder symptom improvement with PT so far. He sustained a fall while forward bending at home and landed on his buttocks then felt severe low back pain. He went for evaluation and xray questionable for new changes. He has f/u scheduled 9/24 with Dr. Conchita Paris.   Previous HPI 05/12/2021 Victor Ramirez is a 66 y.o. male here for follow up for PMR with multiple joint pains on low dose prednisone 5 mg daily. He completed a series of physical therapy for chronic joint pains especially shoulders but concerned about advanced structural problems possibly to need further imaging or procedures. He continues taking the prednisone 5 mg daily without major complications but is having worse symptoms. Right wrist pain at the ulnar side is frequent and right hand numbness intermittently particularly when driving or first thing in the morning. He has numerous forearm scratches and bleeding areas from a new puppy. The left shoulder hurt worse with PT sessions although his ROM improved very slightly. He has an appointment with Dr. August Saucer later today about the shoulder which has failed to improved with steroid injections, NSAIDs, prednisone, and PT.   Previous HPI 01/29/21 Victor Ramirez is a 66 y.o. male here for follow up for PMR on prednisone 7.5 mg daily. Since our last visit he was at the hospital last month with abdominal pain workup thought to represent pancreatitis. Symptoms are  overall doing better in most areas except right hand, left shoulder, and neck. Left shoulder steroid injection last visit helped for about a month but then had worsening again. His right hand is hurting more in the wrist and hand going numb intermittently. Especially notices this during prolonged driving, and improves keeping wrist in neutral position.   Previous HPI: 08/08/20 Victor Ramirez is a 66 y.o. male referred for PMR. His symptoms started abruptly with severe pain in bilateral shoulders limiting use and mobility especially first thing in the morning.  He does not require any preceding injury or changes in health or medications. For evaluation and primary care clinic with x-ray imaging obtained that did not show any structural cause of symptoms he had markedly high inflammatory markers and was started on oral prednisone for suspected PMR.  He noticed dramatic improvement in symptoms within 2 days of starting the medicine.  At follow-up he was recommended to decrease the dose down to 15 mg of prednisone at which time he noticed a return of his symptoms.  He increased back to 20 mg and feels this is doing well again.  The most affected areas were in the base of his neck and bilateral upper back and shoulders with radiation down the upper portion of the arms.  He also felt like it was affecting his right hip and thigh much worse than left, where  he has chronic deficits due to history of polio.  He has started to notice some weight gain with the oral prednisone otherwise no specific side effect problems.   Review of Systems  Constitutional:  Positive for fatigue.  HENT:  Negative for mouth sores and mouth dryness.   Eyes:  Negative for dryness.  Respiratory:  Negative for shortness of breath.   Cardiovascular:  Negative for chest pain and palpitations.  Gastrointestinal:  Negative for blood in stool, constipation and diarrhea.  Endocrine: Positive for increased urination.  Genitourinary:  Negative  for involuntary urination.  Musculoskeletal:  Positive for joint pain, gait problem, joint pain, joint swelling, myalgias, muscle weakness, morning stiffness, muscle tenderness and myalgias.  Skin:  Negative for color change, rash, hair loss and sensitivity to sunlight.  Allergic/Immunologic: Negative for susceptible to infections.  Neurological:  Negative for dizziness and headaches.  Hematological:  Negative for swollen glands.  Psychiatric/Behavioral:  Positive for sleep disturbance. Negative for depressed mood. The patient is not nervous/anxious.     PMFS History:  Patient Active Problem List   Diagnosis Date Noted   Vitamin D deficiency 10/15/2021   Osteoporosis 10/15/2021   OA (osteoarthritis) of shoulder 09/16/2021   S/P reverse total shoulder arthroplasty, left 09/16/2021   Pain in right wrist 05/12/2021   RUQ abdominal pain    Acute pancreatitis without infection or necrosis 12/02/2020   Pain in left shoulder 11/28/2020   Rheumatoid factor positive 08/29/2020   Polymyalgia rheumatica (HCC) 08/08/2020   Polyarthralgia 08/08/2020   High risk medication use 08/08/2020   Lumbosacral spondylosis with radiculopathy 11/27/2016   Atrophy of muscle of right lower leg 02/04/2014   History of post-polio syndrome 02/02/2014   Pain of left lower extremity 02/02/2014   Chronic back pain 05/14/2013   HTN (hypertension) 05/14/2013   Headache 12/22/2011   Sleep apnea 11/17/2011   Elevation of level of transaminase or lactic acid dehydrogenase (LDH) 09/18/2010   Anxiety 09/08/2010   Tachycardia 09/08/2010   Hyperlipemia 03/01/2006   TIA (transient ischemic attack) 03/01/2006   Tobacco use disorder 03/01/2006   Depressive disorder 10/16/2005    Past Medical History:  Diagnosis Date   Arthritis    Hemochromatosis    Hypertension     Family History  Problem Relation Age of Onset   Arthritis Mother    Thyroid cancer Mother    Melanoma Father    Fibromyalgia Sister    Cancer  Brother    Prostate cancer Brother    Williams syndrome Son    Past Surgical History:  Procedure Laterality Date   ANTERIOR LATERAL LUMBAR FUSION WITH PERCUTANEOUS SCREW 1 LEVEL Right 11/27/2016   Procedure: Lumbar Three-Four Transpsoas Lumbar InterbodyFfusion;  Surgeon: Ditty, Loura Halt, MD;  Location[facility] OR;  Service: Neurosurgery;  Laterality: Right;  L3-4 Transpsoas lumbar interbody fusion/L3-4 Pedicle screw fixation with posterolateral arthrodesis/minimally invasive decompression/Mazor   APPLICATION OF ROBOTIC ASSISTANCE FOR SPINAL PROCEDURE N/A 11/27/2016   Procedure: Lumbar Three-Four Pedicle Screw Fixation with Posterolateral Arthrodesis with Minimally Invasive Decompression with Application of Robotic Assistance;  Surgeon: Ditty, Loura Halt, MD;  Location: [facility] OR;  Service: Neurosurgery;  Laterality: N/A;   BACK SURGERY     x3    BACK SURGERY     battery surgery    REVERSE SHOULDER ARTHROPLASTY Left 09/16/2021   Procedure: LEFT REVERSE SHOULDER ARTHROPLASTY;  Surgeon: Cammy Copa, MD;  Location: [facility] OR;  Service: Orthopedics;  Laterality: Left;   spinal manipulation under anesthesia  TONSILLECTOMY     Social History   Social History Narrative   Not on file   Immunization History  Administered Date(s) Administered   PFIZER(Purple Top)SARS-COV-2 Vaccination 06/01/2019, 07/01/2019     Objective: Vital Signs: BP 130/77 (BP Location: Left Arm, Patient Position: Sitting, Cuff Size: Normal)   Pulse 80   Resp 14   Ht 5\' 8"  (1.727 m)   Wt 189 lb 9.6 oz (86 kg)   BMI 28.83 kg/m    Physical Exam Cardiovascular:     Rate and Rhythm: Normal rate and regular rhythm.  Pulmonary:     Effort: Pulmonary effort is normal.     Breath sounds: Normal breath sounds.  Musculoskeletal:     Right lower leg: No edema.     Left lower leg: No edema.  Neurological:     Mental Status: He is alert.     Comments: Proximal right leg strength mildly decreased worst in  extension  Psychiatric:        Mood and Affect: Mood normal.      Musculoskeletal Exam:  Neck full ROM no tenderness Left shoulder tenderness with pressure, some pain reaching fully overhead and behind low back Elbows full ROM no tenderness or swelling Wrists full ROM no tenderness or swelling, mild right wrist tenderness to pressure and resisted movement Low back tenderness to pressure midline and paraspinal muscles around upper lumbar spine Fingers full ROM no tenderness or swelling Knees full ROM no tenderness or swelling   Investigation: No additional findings.  Imaging: DG BONE DENSITY (DXA)  Result Date: 10/21/2021 EXAM: DUAL X-RAY ABSORPTIOMETRY (DXA) FOR BONE MINERAL DENSITY IMPRESSION: Referring Physician:  10/23/2021 Thurlow Gallaga Your patient completed a bone mineral density test using GE Lunar iDXA system (analysis version: 16). Technologist: ALW PATIENT: Name: Kadeem, Hyle Patient ID: Mariane Masters Birth Date: 1955-10-18 Height: 68.0 in. Sex: Male Measured: 10/21/2021 Weight: 189.6 lbs. Indications: Caucasian, History of tobacco use, Long term steroid use, Polymyalgia Rheumatica, Prednisone, Wellbutrin, History of Fracture (Adult), Glucocorticoids (Chronic) Fractures: Tib/Fib Treatments: ASSESSMENT: The BMD measured at DualFemur Neck Right is 0.899 g/cm2 with a T-score of -1.3 is considered moderately low. Treatment is advised if there are other risk factors. The scan quality is good. Lumbar Spine excluded due to fracture and surgical hardware Site Region Measured Date Measured Age YA BMD Significant CHANGE T-score DualFemur Neck Right 10/21/2021 63.6 -1.3 0.899 g/cm2 DualFemur Total Mean 10/21/2021 63.6 -0.5 1.030 g/cm2 Left Forearm Radius 33% 10/21/2021 63.6 -0.2 0.969 g/cm2 World Health Organization [facility]) criteria for post-menopausal, Caucasian Women: Normal       T-score at or above -1 SD Osteopenia   T-score between -1 and -2.5 SD Osteoporosis T-score at or below -2.5 SD  RECOMMENDATION: Mild to aggressive therapies are available in the form of Hormone replacement therapy (HRT), bisphosphonates, Calcitonin, and SERMs. Additionally, all patients should ensure an adequate intake of dietary calcium (1200 mg/d) and vitamin D (400-800 IU daily). FOLLOW-UP: People with diagnosed cases of osteoporosis or osteopenia should be regularly tested for bone mineral density. For patients eligible for Medicare, routine testing is allowed once every 2 years. The testing frequency can be increased to one year for patients who have rapidly progressing disease, or for those who are receiving medical therapy to restore bone mass. I have reviewed this study and agree with the findings. [facility] Electronically Signed   By: [facility] M.D.   On: 10/21/2021 10:07   XR Shoulder Left  Result Date: 10/01/2021 AP  axillary outlet radiographs left shoulder reviewed.  Reverse shoulder prosthesis in good position alignment with no complicating features.   Recent Labs: Lab Results  Component Value Date   WBC 11.4 (H) 09/08/2021   HGB 14.8 09/08/2021   PLT 284 09/08/2021   NA 138 10/15/2021   K 5.4 (H) 10/15/2021   CL 105 10/15/2021   CO2 26 10/15/2021   GLUCOSE 99 10/15/2021   BUN 11 10/15/2021   CREATININE 0.72 10/15/2021   BILITOT 1.0 12/03/2020   ALKPHOS 71 12/03/2020   AST 33 12/03/2020   ALT 46 (H) 12/03/2020   PROT 7.0 12/03/2020   ALBUMIN 3.6 12/03/2020   CALCIUM 9.5 10/15/2021   GFRAA 111 08/08/2020    Speciality Comments: No specialty comments available.  Procedures:  No procedures performed Allergies: Bee venom   Assessment / Plan:     Visit Diagnoses: Polymyalgia rheumatica (HCC) High risk medication use - Prednisone 1 TABLET(5 MG) BY MOUTH DAILY WITH BREAKFAST.   Symptoms doing fairly well on low dose daily prednisone. Unable to tolerate methotrexate with LFT changes. Recent inflammatory markers have normalized. PT for underlying shoulder  deficits. Plan to continue prednisone 5 mg daily.  Pain in right wrist Rheumatoid factor positive  No objective inflammation again on exam today, questionable for peripheral inflammatory arthropathy.  Fracture - Plan: DG BONE DENSITY (DXA), VITAMIN D 25 Hydroxy (Vit-D Deficiency, Fractures), BASIC METABOLIC PANEL WITH GFR  Low back pain xray is questionable for vertebral compression fracture or height loss, without significant spondylolisthesis. He is on long term low dose glucocorticoids so has risk factor for GIOP. Will check vitamin D level and order for bone density testing. If significantly low may need medical treatment. He is already scheduled to follow up with spine specialist.   Orders: Orders Placed This Encounter  Procedures   DG BONE DENSITY (DXA)   VITAMIN D 25 Hydroxy (Vit-D Deficiency, Fractures)   BASIC METABOLIC PANEL WITH GFR   No orders of the defined types were placed in this encounter.    Follow-Up Instructions: Return in about 3 months (around 01/15/2022) for PMR on GC/OP frax f/u 52mos.   Fuller Plan, MD  Note - This record has been created using [employer].  Chart creation errors have been sought, but may not always  have been located. Such creation errors do not reflect on  the standard of medical care.

## 2021-10-03 ENCOUNTER — Telehealth: Payer: Self-pay | Admitting: Surgical

## 2021-10-03 MED ORDER — OXYCODONE HCL 5 MG PO TABS: 5.0000 mg | ORAL_TABLET | Freq: Three times a day (TID) | ORAL | 0 refills | Status: DC | PRN

## 2021-10-03 NOTE — Telephone Encounter (Signed)
IC advised this was done.

## 2021-10-03 NOTE — Telephone Encounter (Signed)
IC advised.  

## 2021-10-03 NOTE — Telephone Encounter (Signed)
I sent this in

## 2021-10-03 NOTE — Telephone Encounter (Signed)
Patient called advised he is out of his pain medication (Oxycodone)  Patient asked for a call back when Rx is sent to the pharmacy.   Patient uses [facility] on [address].    The number to contact patient is [phone]

## 2021-10-07 ENCOUNTER — Ambulatory Visit: Payer: Medicare Other | Admitting: Physical Therapy

## 2021-10-08 ENCOUNTER — Other Ambulatory Visit: Payer: Self-pay | Admitting: Surgical

## 2021-10-08 MED ORDER — OXYCODONE HCL 5 MG PO TABS: 5.0000 mg | ORAL_TABLET | Freq: Three times a day (TID) | ORAL | 0 refills | Status: DC | PRN

## 2021-10-09 ENCOUNTER — Other Ambulatory Visit: Payer: Self-pay | Admitting: Internal Medicine

## 2021-10-09 DIAGNOSIS — M353 Polymyalgia rheumatica: Secondary | ICD-10-CM

## 2021-10-09 NOTE — Telephone Encounter (Signed)
Next Visit: 10/15/2021  Last Visit: 05/12/2021  Last Fill: 07/22/2021  Dx: Polymyalgia rheumatica  Current Dose per office note on 05/12/2021: plan to continue the current prednisone 5 mg daily for now.  Okay to refill Prednisone?

## 2021-10-10 ENCOUNTER — Ambulatory Visit: Payer: Medicare Other | Attending: Orthopedic Surgery

## 2021-10-10 ENCOUNTER — Other Ambulatory Visit: Payer: Self-pay

## 2021-10-10 DIAGNOSIS — Z96612 Presence of left artificial shoulder joint: Secondary | ICD-10-CM | POA: Insufficient documentation

## 2021-10-10 DIAGNOSIS — R600 Localized edema: Secondary | ICD-10-CM | POA: Diagnosis not present

## 2021-10-10 DIAGNOSIS — M25612 Stiffness of left shoulder, not elsewhere classified: Secondary | ICD-10-CM | POA: Diagnosis not present

## 2021-10-10 DIAGNOSIS — M6281 Muscle weakness (generalized): Secondary | ICD-10-CM | POA: Insufficient documentation

## 2021-10-10 DIAGNOSIS — G8929 Other chronic pain: Secondary | ICD-10-CM | POA: Insufficient documentation

## 2021-10-10 DIAGNOSIS — M25512 Pain in left shoulder: Secondary | ICD-10-CM | POA: Diagnosis not present

## 2021-10-10 NOTE — Therapy (Signed)
OUTPATIENT PHYSICAL THERAPY SHOULDER EVALUATION   Patient Name: Victor Ramirez MRN: [id number] DOB:1955/09/12, 66 y.o., male Today's Date: 10/10/2021   PT End of Session - 10/10/21 1039     Visit Number 1    Number of Visits 17    Date for PT Re-Evaluation 12/12/21    Authorization Type BCBS    Authorization Time Period FOTO v6, v10    Progress Note Due on Visit 10    PT Start Time 1000    PT Stop Time 1040    PT Time Calculation (min) 40 min    Activity Tolerance Patient tolerated treatment well;Patient limited by pain    Behavior During Therapy WFL for tasks assessed/performed             Past Medical History:  Diagnosis Date   Arthritis    Hemochromatosis    Hypertension    Past Surgical History:  Procedure Laterality Date   ANTERIOR LATERAL LUMBAR FUSION WITH PERCUTANEOUS SCREW 1 LEVEL Right 11/27/2016   Procedure: Lumbar Three-Four Transpsoas Lumbar InterbodyFfusion;  Surgeon: Ditty, Loura Halt, MD;  Location: [facility] OR;  Service: Neurosurgery;  Laterality: Right;  L3-4 Transpsoas lumbar interbody fusion/L3-4 Pedicle screw fixation with posterolateral arthrodesis/minimally invasive decompression/Mazor   APPLICATION OF ROBOTIC ASSISTANCE FOR SPINAL PROCEDURE N/A 11/27/2016   Procedure: Lumbar Three-Four Pedicle Screw Fixation with Posterolateral Arthrodesis with Minimally Invasive Decompression with Application of Robotic Assistance;  Surgeon: Ditty, Loura Halt, MD;  Location: [facility] OR;  Service: Neurosurgery;  Laterality: N/A;   BACK SURGERY     x3    BACK SURGERY     battery surgery    REVERSE SHOULDER ARTHROPLASTY Left 09/16/2021   Procedure: LEFT REVERSE SHOULDER ARTHROPLASTY;  Surgeon: Cammy Copa, MD;  Location: [facility] OR;  Service: Orthopedics;  Laterality: Left;   spinal manipulation under anesthesia     TONSILLECTOMY     Patient Active Problem List   Diagnosis Date Noted   OA (osteoarthritis) of shoulder 09/16/2021   S/P reverse total shoulder  arthroplasty, left 09/16/2021   Pain in right wrist 05/12/2021   RUQ abdominal pain    Acute pancreatitis without infection or necrosis 12/02/2020   Pain in left shoulder 11/28/2020   Rheumatoid factor positive 08/29/2020   Polymyalgia rheumatica (HCC) 08/08/2020   Polyarthralgia 08/08/2020   High risk medication use 08/08/2020   Lumbosacral spondylosis with radiculopathy 11/27/2016   Atrophy of muscle of right lower leg 02/04/2014   History of post-polio syndrome 02/02/2014   Pain of left lower extremity 02/02/2014   Chronic back pain 05/14/2013   HTN (hypertension) 05/14/2013   Headache 12/22/2011   Sleep apnea 11/17/2011   Elevation of level of transaminase or lactic acid dehydrogenase (LDH) 09/18/2010   Anxiety 09/08/2010   Tachycardia 09/08/2010   Hyperlipemia 03/01/2006   TIA (transient ischemic attack) 03/01/2006   Tobacco use disorder 03/01/2006   Depressive disorder 10/16/2005    PCP: Wilfrid Lund, PA  REFERRING PROVIDER: Cammy Copa, MD  REFERRING DIAG: [phone] (ICD-10-CM) - History of arthroplasty of left shoulder  THERAPY DIAG:  Chronic left shoulder pain  Decreased ROM of left shoulder  Muscle weakness (generalized)  Rationale for Evaluation and Treatment Rehabilitation  ONSET DATE: 09/16/2021  SUBJECTIVE:  SUBJECTIVE STATEMENT: Pt reports primary c/o Lt shoulder pain and stiffness s/p Lt reverse shoulder arthroplasty with Dr. Rise Paganini on 09/16/2021.  Pt reports mild pain currently on pain medication. Pt reports he has been using a PROM machine since discharge from hospital to good effect. He denies performing any exercises. He adds that he hurt his back about a month ago when falling onto his butt when trying to drink water in a forward flexed position to get rid of  hiccups. He has an orthopedic evaluation for this problem on 10/16/2021. Current shoulder pain is 3/10. Worst pain is 10/10. Best pain is 0/10. Aggravating factors include reaching overhead, shoulder rotation. Easing factors include ice, compression, pain medications. Pt denies any N/T related to his problem.   PERTINENT HISTORY: Left reverse shoulder arthroplasty with Dr. Rise Paganini on 09/16/2021, L1 fx from 09/05/2021 (first orthopedic appoint scheduled for 10/16/2021), HTN  PAIN:  Are you having pain? Yes: NPRS scale: 3/10 Pain location: Lt shoulder Pain description: Achy, sharp Aggravating factors: reaching overhead, shoulder rotation Relieving factors: ice, compression, pain medications  PRECAUTIONS: Shoulder: Active and passive ROM as tolerated to pain tolerance (Utilizing [facility] and [facility] Reverse Total Shoulder Arthoplasty Protocol), avoid shoulder extension past neutral and combined shoulder adduction/IR x10-12 weeks  WEIGHT BEARING RESTRICTIONS No  FALLS:  Has patient fallen in last 6 months? Yes. Number of falls 1 fall about a month ago onto his butt when trying to drink water in a trunk flexed position to get rid of hiccups, resulting in lumbar injury which pt is being evaluated for in 6 days.  LIVING ENVIRONMENT: Lives with: lives alone Lives in: House/apartment Stairs: No Has following equipment at home: None  OCCUPATION: On disability  PLOF: Independent  PATIENT GOALS Reach overhead, getting dressed, ADLs, return to working out  OBJECTIVE:   DIAGNOSTIC FINDINGS:  10/01/2021: XR Shoulder Left: AP axillary outlet radiographs left shoulder reviewed.  Reverse shoulder  prosthesis in good position alignment with no complicating features.  PATIENT SURVEYS:  FOTO 32%, predicted 61% in 21 visits  COGNITION:  Overall cognitive status: Within functional limits for tasks assessed     SENSATION: Not tested  POSTURE: Forward shoulders/ head  UPPER EXTREMITY  ROM:   A/PROM Right eval Left eval  Shoulder flexion  122p!/140 (springy end-feel)  Shoulder abduction  130p!/ 140 (springy end-feel)  Shoulder internal rotation  30/40p!  Shoulder external rotation  25/34p!  (Blank rows = not tested)  UPPER EXTREMITY MMT:  MMT Right eval Left eval  Shoulder flexion 5/5 3+/5p!  Shoulder extension 5/5 3/5p!  Shoulder abduction 5/5 3+/5p!  Shoulder internal rotation 5/5 3+/5p!  Shoulder external rotation 5/5 3+/5p!  Middle trapezius    Lower trapezius    Latissimus dorsi    Elbow flexion 5/5 4/5p!  Elbow extension 5/5 5/5  Grip strength (lbs) 101 98  (Blank rows = not tested)   PALPATION:  TTP along surgical incision on Lt shoulder   TODAY'S TREATMENT:  10/10/2021: Demonstrated and issued HEP   PATIENT EDUCATION: Education details: Pt educated on prognosis, POC, FOTO, and HEP Person educated: Patient Education method: Programmer, multimedia, Demonstration, and Handouts Education comprehension: verbalized understanding and returned demonstration   HOME EXERCISE PROGRAM: Access Code: [email or web address] URL: [email or web address] Date: 10/10/2021 Prepared by: Carmelina Dane  Exercises - Shoulder Scaption AAROM with Dowel (Mirrored)  - 2 x daily - 7 x weekly - 3 sets - 10 reps - 5 seconds hold - Standing Shoulder External Rotation AAROM with Dowel  -  2 x daily - 7 x weekly - 3 sets - 10 reps - 5 seconds hold - Isometric shoulder flexion at wall with 50% force  - 1 x daily - 7 x weekly - 2 sets - 10 reps - 5 seconds hold - Isometric shoulder external rotation at wall with 50% force  - 1 x daily - 7 x weekly - 2 sets - 10 reps - 5 seconds hold - Isometric shoulder abduction at wall with 50% force  - 1 x daily - 7 x weekly - 2 sets - 10 reps - 5 seconds hold  ASSESSMENT:  CLINICAL IMPRESSION: Patient is a 66 y.o. M who was seen today for physical therapy evaluation and treatment for Lt shoulder pain and stiffness s/p Lt reverse shoulder  arthroplasty with Dr. Rise Paganini on 09/16/2021. Upon assessment, his primary impairments include limited and painful global Lt shoulder A/PROM, painful and weak global Lt shoulder MMT, and TTP to surgical incision. Pt will benefit from skilled PT to address his primary impairments and return to his prior level of function with less limitation.   OBJECTIVE IMPAIRMENTS decreased mobility, decreased ROM, decreased strength, hypomobility, increased edema, impaired flexibility, impaired UE functional use, improper body mechanics, postural dysfunction, and pain.   ACTIVITY LIMITATIONS carrying, lifting, sleeping, bathing, dressing, reach over head, and hygiene/grooming  PARTICIPATION LIMITATIONS: meal prep, cleaning, laundry, driving, shopping, community activity, and yard work  PERSONAL FACTORS 1-2 comorbidities: See medical hx  are also affecting patient's functional outcome.   REHAB POTENTIAL: Good  CLINICAL DECISION MAKING: Stable/uncomplicated  EVALUATION COMPLEXITY: Low   GOALS: Goals reviewed with patient? Yes  SHORT TERM GOALS: Target date: 11/07/2021   Pt will report understanding and adherence to initial HEP in order to promote independence in the management of primary impairments. Baseline: HEP provided at eval Goal status: INITIAL  2.  Pt will achieve Lt shoulder elevation AROM of 150 degrees in order to progress post-op protocol. Baseline: Flexion: 122d, abductio: 130d Goal status: INITIAL   LONG TERM GOALS: Target date: 12/05/2021  Pt will achieve a FOTO score of 61% in order to demonstrate improved functional ability as it relates to his primary impairments. Baseline: 32% Goal status: INITIAL  2.  Pt will achieve Lt shoulder elevation AROM of 165 degrees in order to reach into overhead cabinets with less limitation. Baseline: Flexion: 122d, abductio: 130d Goal status: INITIAL  3.  Pt will achieve Lt shoulder IR and ER AROM of 60 degrees in order to get dressed with  less limitation. Baseline: ER: 25d, IR: 30d Goal status: INITIAL  4.  Pt will achieve global Lt shoulder MMT of 4+/5 or greater in order to progress his independent strengthening regimen with less limitation. Baseline: See MMT chart Goal status: INITIAL  5.  Pt will demonstrate ability to lift 10# overhead with Lt UE with 0-3/10 pain in order to put away storage items into his attic with less limitation. Baseline: Unable to lift with Lt UE due to post-op status Goal status: INITIAL    PLAN: PT FREQUENCY: 2x/week  PT DURATION: 8 weeks  PLANNED INTERVENTIONS: Therapeutic exercises, Therapeutic activity, Neuromuscular re-education, Patient/Family education, Self Care, Joint mobilization, Aquatic Therapy, Dry Needling, Electrical stimulation, Spinal manipulation, Spinal mobilization, Cryotherapy, Moist heat, scar mobilization, Taping, Vasopneumatic device, Biofeedback, Ionotophoresis 4mg /ml Dexamethasone, Manual therapy, and Re-evaluation  PLAN FOR NEXT SESSION: Progress early shoulder ROM, strengthening in accordance with [facility] and [facility] Reverse Total Shoulder Arthoplasty Protocol   [facility], PT, DPT 10/10/21  11:40 AM

## 2021-10-14 ENCOUNTER — Telehealth: Payer: Self-pay | Admitting: Orthopedic Surgery

## 2021-10-14 ENCOUNTER — Encounter: Payer: Self-pay | Admitting: Physical Therapy

## 2021-10-14 ENCOUNTER — Ambulatory Visit: Payer: Medicare Other | Admitting: Physical Therapy

## 2021-10-14 DIAGNOSIS — R600 Localized edema: Secondary | ICD-10-CM

## 2021-10-14 DIAGNOSIS — G8929 Other chronic pain: Secondary | ICD-10-CM

## 2021-10-14 DIAGNOSIS — M6281 Muscle weakness (generalized): Secondary | ICD-10-CM

## 2021-10-14 DIAGNOSIS — Z96612 Presence of left artificial shoulder joint: Secondary | ICD-10-CM | POA: Diagnosis not present

## 2021-10-14 DIAGNOSIS — M25612 Stiffness of left shoulder, not elsewhere classified: Secondary | ICD-10-CM | POA: Diagnosis not present

## 2021-10-14 DIAGNOSIS — M25512 Pain in left shoulder: Secondary | ICD-10-CM | POA: Diagnosis not present

## 2021-10-14 NOTE — Therapy (Signed)
OUTPATIENT PHYSICAL THERAPY TREATMENT NOTE   Patient Name: Victor Ramirez MRN: [id number] DOB:03-29-1955, 66 y.o., male Today's Date: 10/14/2021  PCP: Wilfrid Lund, [place]   REFERRING PROVIDER: Cammy Copa, MD   PT End of Session - 10/14/21 1218     Visit Number 2    Number of Visits 17    Date for PT Re-Evaluation 12/12/21    Authorization Type BCBS    Authorization Time Period FOTO v6, v10    Progress Note Due on Visit 10    PT Start Time 1217    PT Stop Time 1257    PT Time Calculation (min) 40 min    Activity Tolerance Patient tolerated treatment well;Patient limited by pain    Behavior During Therapy [facility] for tasks assessed/performed            Past Medical History:  Diagnosis Date   Arthritis    Hemochromatosis    Hypertension    Past Surgical History:  Procedure Laterality Date   ANTERIOR LATERAL LUMBAR FUSION WITH PERCUTANEOUS SCREW 1 LEVEL Right 11/27/2016   Procedure: Lumbar Three-Four Transpsoas Lumbar InterbodyFfusion;  Surgeon: Ditty, Loura Halt, MD;  Location: [facility] OR;  Service: Neurosurgery;  Laterality: Right;  L3-4 Transpsoas lumbar interbody fusion/L3-4 Pedicle screw fixation with posterolateral arthrodesis/minimally invasive decompression/Mazor   APPLICATION OF ROBOTIC ASSISTANCE FOR SPINAL PROCEDURE N/A 11/27/2016   Procedure: Lumbar Three-Four Pedicle Screw Fixation with Posterolateral Arthrodesis with Minimally Invasive Decompression with Application of Robotic Assistance;  Surgeon: Ditty, Loura Halt, MD;  Location: [facility] OR;  Service: Neurosurgery;  Laterality: N/A;   BACK SURGERY     x3    BACK SURGERY     battery surgery    REVERSE SHOULDER ARTHROPLASTY Left 09/16/2021   Procedure: LEFT REVERSE SHOULDER ARTHROPLASTY;  Surgeon: Cammy Copa, MD;  Location: [facility] OR;  Service: Orthopedics;  Laterality: Left;   spinal manipulation under anesthesia     TONSILLECTOMY     Patient Active Problem List   Diagnosis Date Noted   OA  (osteoarthritis) of shoulder 09/16/2021   S/P reverse total shoulder arthroplasty, left 09/16/2021   Pain in right wrist 05/12/2021   RUQ abdominal pain    Acute pancreatitis without infection or necrosis 12/02/2020   Pain in left shoulder 11/28/2020   Rheumatoid factor positive 08/29/2020   Polymyalgia rheumatica (HCC) 08/08/2020   Polyarthralgia 08/08/2020   High risk medication use 08/08/2020   Lumbosacral spondylosis with radiculopathy 11/27/2016   Atrophy of muscle of right lower leg 02/04/2014   History of post-polio syndrome 02/02/2014   Pain of left lower extremity 02/02/2014   Chronic back pain 05/14/2013   HTN (hypertension) 05/14/2013   Headache 12/22/2011   Sleep apnea 11/17/2011   Elevation of level of transaminase or lactic acid dehydrogenase (LDH) 09/18/2010   Anxiety 09/08/2010   Tachycardia 09/08/2010   Hyperlipemia 03/01/2006   TIA (transient ischemic attack) 03/01/2006   Tobacco use disorder 03/01/2006   Depressive disorder 10/16/2005    THERAPY DIAG:  Chronic left shoulder pain  Decreased ROM of left shoulder  Muscle weakness (generalized)  Localized edema  REFERRING DIAG: F68.127 (ICD-10-CM) - History of arthroplasty of left shoulder  PERTINENT HISTORY: Left reverse shoulder arthroplasty with Dr. Rise Paganini on 09/16/2021, L1 fx from 09/05/2021 (first orthopedic appoint scheduled for 10/16/2021), HTN  PRECAUTIONS/RESTRICTIONS:    Shoulder: Active and passive ROM as tolerated to pain tolerance (Utilizing [facility] and [facility] Reverse Total Shoulder Arthoplasty Protocol), avoid shoulder extension past neutral and  combined shoulder adduction/IR x10-12 weeks  SUBJECTIVE:  Pt reports he is doing well overall and seeing improvement in his shoulder  PAIN:  Are you having pain? Yes: NPRS scale: 2/10 Pain location: Lt shoulder Pain description: Achy, sharp Aggravating factors: reaching overhead, shoulder rotation Relieving factors: ice, compression,  pain medications  OBJECTIVE:  DIAGNOSTIC FINDINGS:  10/01/2021: XR Shoulder Left: AP axillary outlet radiographs left shoulder reviewed.  Reverse shoulder  prosthesis in good position alignment with no complicating features.   PATIENT SURVEYS:  FOTO 32%, predicted 61% in 21 visits   COGNITION:           Overall cognitive status: Within functional limits for tasks assessed                                  SENSATION: Not tested   POSTURE: Forward shoulders/ head   UPPER EXTREMITY ROM:    A/PROM Right eval Left eval  Shoulder flexion   122p!/140 (springy end-feel)  Shoulder abduction   130p!/ 140 (springy end-feel)  Shoulder internal rotation   30/40p!  Shoulder external rotation   25/34p!  (Blank rows = not tested)   UPPER EXTREMITY MMT:   MMT Right eval Left eval  Shoulder flexion 5/5 3+/5p!  Shoulder extension 5/5 3/5p!  Shoulder abduction 5/5 3+/5p!  Shoulder internal rotation 5/5 3+/5p!  Shoulder external rotation 5/5 3+/5p!  Middle trapezius      Lower trapezius      Latissimus dorsi      Elbow flexion 5/5 4/5p!  Elbow extension 5/5 5/5  Grip strength (lbs) 101 98  (Blank rows = not tested)     PALPATION:  TTP along surgical incision on Lt shoulder             TODAY'S TREATMENT:  10/10/2021: Demonstrated and issued HEP      HOME EXERCISE PROGRAM: Access Code: [email or web address] URL: [email or web address] Date: 10/10/2021 Prepared by: Carmelina Dane   Exercises - Shoulder Scaption AAROM with Dowel (Mirrored)  - 2 x daily - 7 x weekly - 3 sets - 10 reps - 5 seconds hold - Standing Shoulder External Rotation AAROM with Dowel  - 2 x daily - 7 x weekly - 3 sets - 10 reps - 5 seconds hold - Isometric shoulder flexion at wall with 50% force  - 1 x daily - 7 x weekly - 2 sets - 10 reps - 5 seconds hold - Isometric shoulder external rotation at wall with 50% force  - 1 x daily - 7 x weekly - 2 sets - 10 reps - 5 seconds hold - Isometric shoulder  abduction at wall with 50% force  - 1 x daily - 7 x weekly - 2 sets - 10 reps - 5 seconds hold   TREATMENT 8/15:  Therapeutic Exercise: - dowel chest press - 2x20 - AAROM flexion in supine - non-painful arc - 2x20 - towel slide  - 2x20x - Pulley - 2x20x  Manual Therapy: - Gentle flexion to end range - gentle ER  Modalities:  Vasopneumatic (Game Ready)    Location:  left shoulder Time:  10 minutes Pressure:  medium Temperature:  38 degrees   ASSESSMENT:   CLINICAL IMPRESSION: Theodoro Grist did well with therapy with no adverse reaction.  He is progressing as expected with good range of motion and appropriate strength given time since surgery.     OBJECTIVE IMPAIRMENTS decreased mobility, decreased ROM, decreased strength,  hypomobility, increased edema, impaired flexibility, impaired UE functional use, improper body mechanics, postural dysfunction, and pain.    ACTIVITY LIMITATIONS carrying, lifting, sleeping, bathing, dressing, reach over head, and hygiene/grooming   PARTICIPATION LIMITATIONS: meal prep, cleaning, laundry, driving, shopping, community activity, and yard work   PERSONAL FACTORS 1-2 comorbidities: See medical hx  are also affecting patient's functional outcome.    REHAB POTENTIAL: Good   CLINICAL DECISION MAKING: Stable/uncomplicated   EVALUATION COMPLEXITY: Low     GOALS: Goals reviewed with patient? Yes   SHORT TERM GOALS: Target date: 11/07/2021    Pt will report understanding and adherence to initial HEP in order to promote independence in the management of primary impairments. Baseline: HEP provided at eval Goal status: INITIAL   2.  Pt will achieve Lt shoulder elevation AROM of 150 degrees in order to progress post-op protocol. Baseline: Flexion: 122d, abductio: 130d Goal status: INITIAL     LONG TERM GOALS: Target date: 12/05/2021   Pt will achieve a FOTO score of 61% in order to demonstrate improved functional ability as it relates to his  primary impairments. Baseline: 32% Goal status: INITIAL   2.  Pt will achieve Lt shoulder elevation AROM of 165 degrees in order to reach into overhead cabinets with less limitation. Baseline: Flexion: 122d, abductio: 130d Goal status: INITIAL   3.  Pt will achieve Lt shoulder IR and ER AROM of 60 degrees in order to get dressed with less limitation. Baseline: ER: 25d, IR: 30d Goal status: INITIAL   4.  Pt will achieve global Lt shoulder MMT of 4+/5 or greater in order to progress his independent strengthening regimen with less limitation. Baseline: See MMT chart Goal status: INITIAL   5.  Pt will demonstrate ability to lift 10# overhead with Lt UE with 0-3/10 pain in order to put away storage items into his attic with less limitation. Baseline: Unable to lift with Lt UE due to post-op status Goal status: INITIAL       PLAN: PT FREQUENCY: 2x/week   PT DURATION: 8 weeks   PLANNED INTERVENTIONS: Therapeutic exercises, Therapeutic activity, Neuromuscular re-education, Patient/Family education, Self Care, Joint mobilization, Aquatic Therapy, Dry Needling, Electrical stimulation, Spinal manipulation, Spinal mobilization, Cryotherapy, Moist heat, scar mobilization, Taping, Vasopneumatic device, Biofeedback, Ionotophoresis 4mg /ml Dexamethasone, Manual therapy, and Re-evaluation   PLAN FOR NEXT SESSION: Progress early shoulder ROM, strengthening in accordance with [facility] and [facility] Reverse Total Shoulder Arthoplasty Protocol   [facility] Tobechukwu Emmick PT 10/14/2021, 1:12 PM

## 2021-10-14 NOTE — Telephone Encounter (Signed)
Pt called requesting a refill of oxycodone. Pt phone number is [phone].

## 2021-10-15 ENCOUNTER — Ambulatory Visit: Payer: Medicare Other | Attending: Internal Medicine | Admitting: Internal Medicine

## 2021-10-15 ENCOUNTER — Encounter: Payer: Self-pay | Admitting: Internal Medicine

## 2021-10-15 ENCOUNTER — Other Ambulatory Visit: Payer: Self-pay | Admitting: Surgical

## 2021-10-15 VITALS — BP 130/77 | HR 80 | Resp 14 | Ht 68.0 in | Wt 189.6 lb

## 2021-10-15 DIAGNOSIS — E559 Vitamin D deficiency, unspecified: Secondary | ICD-10-CM | POA: Diagnosis not present

## 2021-10-15 DIAGNOSIS — T148XXA Other injury of unspecified body region, initial encounter: Secondary | ICD-10-CM | POA: Diagnosis not present

## 2021-10-15 DIAGNOSIS — R29898 Other symptoms and signs involving the musculoskeletal system: Secondary | ICD-10-CM

## 2021-10-15 DIAGNOSIS — R768 Other specified abnormal immunological findings in serum: Secondary | ICD-10-CM

## 2021-10-15 DIAGNOSIS — Z79899 Other long term (current) drug therapy: Secondary | ICD-10-CM | POA: Diagnosis not present

## 2021-10-15 DIAGNOSIS — M25531 Pain in right wrist: Secondary | ICD-10-CM

## 2021-10-15 DIAGNOSIS — M810 Age-related osteoporosis without current pathological fracture: Secondary | ICD-10-CM | POA: Insufficient documentation

## 2021-10-15 DIAGNOSIS — M62561 Muscle wasting and atrophy, not elsewhere classified, right lower leg: Secondary | ICD-10-CM

## 2021-10-15 DIAGNOSIS — M353 Polymyalgia rheumatica: Secondary | ICD-10-CM

## 2021-10-15 MED ORDER — OXYCODONE HCL 5 MG PO TABS: 5.0000 mg | ORAL_TABLET | Freq: Three times a day (TID) | ORAL | 0 refills | Status: DC | PRN

## 2021-10-15 NOTE — Telephone Encounter (Signed)
Sent in refill earlier today

## 2021-10-16 ENCOUNTER — Ambulatory Visit: Payer: Medicare Other

## 2021-10-16 DIAGNOSIS — M25612 Stiffness of left shoulder, not elsewhere classified: Secondary | ICD-10-CM | POA: Diagnosis not present

## 2021-10-16 DIAGNOSIS — M25512 Pain in left shoulder: Secondary | ICD-10-CM | POA: Diagnosis not present

## 2021-10-16 DIAGNOSIS — Z96612 Presence of left artificial shoulder joint: Secondary | ICD-10-CM | POA: Diagnosis not present

## 2021-10-16 DIAGNOSIS — R600 Localized edema: Secondary | ICD-10-CM

## 2021-10-16 DIAGNOSIS — M6281 Muscle weakness (generalized): Secondary | ICD-10-CM | POA: Diagnosis not present

## 2021-10-16 DIAGNOSIS — G8929 Other chronic pain: Secondary | ICD-10-CM | POA: Diagnosis not present

## 2021-10-16 LAB — BASIC METABOLIC PANEL WITH GFR
BUN: 11 mg/dL (ref 7–25)
CO2: 26 mmol/L (ref 20–32)
Calcium: 9.5 mg/dL (ref 8.6–10.3)
Chloride: 105 mmol/L (ref 98–110)
Creat: 0.72 mg/dL (ref 0.70–1.35)
Glucose, Bld: 99 mg/dL (ref 65–99)
Potassium: 5.4 mmol/L — ABNORMAL HIGH (ref 3.5–5.3)
Sodium: 138 mmol/L (ref 135–146)
eGFR: 103 mL/min/{1.73_m2} (ref >=60)

## 2021-10-16 LAB — VITAMIN D 25 HYDROXY (VIT D DEFICIENCY, FRACTURES): Vit D, 25-Hydroxy: 23 ng/mL — ABNORMAL LOW (ref 30–100)

## 2021-10-16 NOTE — Progress Notes (Signed)
Vitamin D level is low at 23 this needs to be at least 30.  No need to be corrected before we could add on any medical treatment for bone density.  He should start taking daily supplement with 4000 or 5000 units of vitamin D3 and expect this should correct it by our next follow-up in 3 months.

## 2021-10-16 NOTE — Therapy (Signed)
OUTPATIENT PHYSICAL THERAPY TREATMENT NOTE   Patient Name: Victor Ramirez MRN: [id number] DOB:1955-09-23, 66 y.o., male Today's Date: 10/16/2021  PCP: Wilfrid Lund, [place]   REFERRING PROVIDER: Cammy Copa, MD   PT End of Session - 10/16/21 1006     Visit Number 3    Number of Visits 17    Date for PT Re-Evaluation 12/12/21    Authorization Type BCBS    Authorization Time Period FOTO v6, v10    Progress Note Due on Visit 10    PT Start Time 1005    PT Stop Time 1055    PT Time Calculation (min) 50 min    Activity Tolerance Patient tolerated treatment well;Patient limited by pain    Behavior During Therapy WFL for tasks assessed/performed             Past Medical History:  Diagnosis Date   Arthritis    Hemochromatosis    Hypertension    Past Surgical History:  Procedure Laterality Date   ANTERIOR LATERAL LUMBAR FUSION WITH PERCUTANEOUS SCREW 1 LEVEL Right 11/27/2016   Procedure: Lumbar Three-Four Transpsoas Lumbar InterbodyFfusion;  Surgeon: Ditty, Loura Halt, MD;  Location: [facility] OR;  Service: Neurosurgery;  Laterality: Right;  L3-4 Transpsoas lumbar interbody fusion/L3-4 Pedicle screw fixation with posterolateral arthrodesis/minimally invasive decompression/Mazor   APPLICATION OF ROBOTIC ASSISTANCE FOR SPINAL PROCEDURE N/A 11/27/2016   Procedure: Lumbar Three-Four Pedicle Screw Fixation with Posterolateral Arthrodesis with Minimally Invasive Decompression with Application of Robotic Assistance;  Surgeon: Ditty, Loura Halt, MD;  Location: [facility] OR;  Service: Neurosurgery;  Laterality: N/A;   BACK SURGERY     x3    BACK SURGERY     battery surgery    REVERSE SHOULDER ARTHROPLASTY Left 09/16/2021   Procedure: LEFT REVERSE SHOULDER ARTHROPLASTY;  Surgeon: Cammy Copa, MD;  Location: [facility] OR;  Service: Orthopedics;  Laterality: Left;   spinal manipulation under anesthesia     TONSILLECTOMY     Patient Active Problem List   Diagnosis Date Noted    Vitamin D deficiency 10/15/2021   Osteoporosis 10/15/2021   OA (osteoarthritis) of shoulder 09/16/2021   S/P reverse total shoulder arthroplasty, left 09/16/2021   Pain in right wrist 05/12/2021   RUQ abdominal pain    Acute pancreatitis without infection or necrosis 12/02/2020   Pain in left shoulder 11/28/2020   Rheumatoid factor positive 08/29/2020   Polymyalgia rheumatica (HCC) 08/08/2020   Polyarthralgia 08/08/2020   High risk medication use 08/08/2020   Lumbosacral spondylosis with radiculopathy 11/27/2016   Atrophy of muscle of right lower leg 02/04/2014   History of post-polio syndrome 02/02/2014   Pain of left lower extremity 02/02/2014   Chronic back pain 05/14/2013   HTN (hypertension) 05/14/2013   Headache 12/22/2011   Sleep apnea 11/17/2011   Elevation of level of transaminase or lactic acid dehydrogenase (LDH) 09/18/2010   Anxiety 09/08/2010   Tachycardia 09/08/2010   Hyperlipemia 03/01/2006   TIA (transient ischemic attack) 03/01/2006   Tobacco use disorder 03/01/2006   Depressive disorder 10/16/2005    THERAPY DIAG:  Chronic left shoulder pain  Decreased ROM of left shoulder  Muscle weakness (generalized)  Localized edema  REFERRING DIAG: M35.597 (ICD-10-CM) - History of arthroplasty of left shoulder  PERTINENT HISTORY: Left reverse shoulder arthroplasty with Dr. Rise Paganini on 09/16/2021, L1 fx from 09/05/2021 (first orthopedic appoint scheduled for 10/16/2021), HTN  PRECAUTIONS/RESTRICTIONS:    Shoulder: Active and passive ROM as tolerated to pain tolerance (Utilizing [facility] and [facility]  Reverse Total Shoulder Arthoplasty Protocol), avoid shoulder extension past neutral and combined shoulder adduction/IR x10-12 weeks  SUBJECTIVE: Patient reports he had an xray confirmed L1 vertebral fracture that he is seeing his back doctor next week.  PAIN:  Are you having pain? Yes: NPRS scale: 0/10 Pain location: Lt shoulder Pain description: Achy,  sharp Aggravating factors: reaching overhead, shoulder rotation Relieving factors: ice, compression, pain medications  OBJECTIVE:  DIAGNOSTIC FINDINGS:  10/01/2021: XR Shoulder Left: AP axillary outlet radiographs left shoulder reviewed.  Reverse shoulder  prosthesis in good position alignment with no complicating features.   PATIENT SURVEYS:  FOTO 32%, predicted 61% in 21 visits   COGNITION:           Overall cognitive status: Within functional limits for tasks assessed                                  SENSATION: Not tested   POSTURE: Forward shoulders/ head   UPPER EXTREMITY ROM:    A/PROM Right eval Left eval  Shoulder flexion   122p!/140 (springy end-feel)  Shoulder abduction   130p!/ 140 (springy end-feel)  Shoulder internal rotation   30/40p!  Shoulder external rotation   25/34p!  (Blank rows = not tested)   UPPER EXTREMITY MMT:   MMT Right eval Left eval  Shoulder flexion 5/5 3+/5p!  Shoulder extension 5/5 3/5p!  Shoulder abduction 5/5 3+/5p!  Shoulder internal rotation 5/5 3+/5p!  Shoulder external rotation 5/5 3+/5p!  Middle trapezius      Lower trapezius      Latissimus dorsi      Elbow flexion 5/5 4/5p!  Elbow extension 5/5 5/5  Grip strength (lbs) 101 98  (Blank rows = not tested)     PALPATION:  TTP along surgical incision on Lt shoulder             TODAY'S TREATMENT:  10/10/2021: Demonstrated and issued HEP      HOME EXERCISE PROGRAM: Access Code: [email or web address] URL: [email or web address] Date: 10/10/2021 Prepared by: Carmelina Dane   Exercises - Shoulder Scaption AAROM with Dowel (Mirrored)  - 2 x daily - 7 x weekly - 3 sets - 10 reps - 5 seconds hold - Standing Shoulder External Rotation AAROM with Dowel  - 2 x daily - 7 x weekly - 3 sets - 10 reps - 5 seconds hold - Isometric shoulder flexion at wall with 50% force  - 1 x daily - 7 x weekly - 2 sets - 10 reps - 5 seconds hold - Isometric shoulder external rotation at wall  with 50% force  - 1 x daily - 7 x weekly - 2 sets - 10 reps - 5 seconds hold - Isometric shoulder abduction at wall with 50% force  - 1 x daily - 7 x weekly - 2 sets - 10 reps - 5 seconds hold  TREATMENT 8/17: Therapeutic Exercise: - seated scaption AAROM with dowel x20 - seated ER AROM with dowel x20 - dowel chest press in supine - 2x20 - AAROM flexion in supine - non-painful arc - 2x20 - Pulley - 2x20x (flexion) Manual Therapy: - Gentle flexion to end range - gentle ER Modalities: Vasopneumatic (Game Ready)   Location:  left shoulder Time:  10 minutes Pressure:  medium Temperature:  34 degrees   TREATMENT 8/15:  Therapeutic Exercise: - dowel chest press - 2x20 - AAROM flexion in supine - non-painful arc - 2x20 - towel  slide  - 2x20x - Pulley - 2x20x  Manual Therapy: - Gentle flexion to end range - gentle ER  Modalities:  Vasopneumatic (Game Ready)    Location:  left shoulder Time:  10 minutes Pressure:  medium Temperature:  38 degrees   ASSESSMENT:   CLINICAL IMPRESSION: Patient presents to PT with no current pain in the shoulder, only reports pain with end range exercises. Session today continued to focus on AAROM and manual techniques within protocol range. Utilized vaso at end of session for pain and swelling control. Patient was able to tolerate all prescribed exercises with no adverse effects. Patient continues to benefit from skilled PT services and should be progressed as able, in accordance to protocol, to improve functional independence.      OBJECTIVE IMPAIRMENTS decreased mobility, decreased ROM, decreased strength, hypomobility, increased edema, impaired flexibility, impaired UE functional use, improper body mechanics, postural dysfunction, and pain.    ACTIVITY LIMITATIONS carrying, lifting, sleeping, bathing, dressing, reach over head, and hygiene/grooming   PARTICIPATION LIMITATIONS: meal prep, cleaning, laundry, driving, shopping, community  activity, and yard work   PERSONAL FACTORS 1-2 comorbidities: See medical hx  are also affecting patient's functional outcome.    REHAB POTENTIAL: Good   CLINICAL DECISION MAKING: Stable/uncomplicated   EVALUATION COMPLEXITY: Low     GOALS: Goals reviewed with patient? Yes   SHORT TERM GOALS: Target date: 11/07/2021    Pt will report understanding and adherence to initial HEP in order to promote independence in the management of primary impairments. Baseline: HEP provided at eval Goal status: INITIAL   2.  Pt will achieve Lt shoulder elevation AROM of 150 degrees in order to progress post-op protocol. Baseline: Flexion: 122d, abductio: 130d Goal status: INITIAL     LONG TERM GOALS: Target date: 12/05/2021   Pt will achieve a FOTO score of 61% in order to demonstrate improved functional ability as it relates to his primary impairments. Baseline: 32% Goal status: INITIAL   2.  Pt will achieve Lt shoulder elevation AROM of 165 degrees in order to reach into overhead cabinets with less limitation. Baseline: Flexion: 122d, abductio: 130d Goal status: INITIAL   3.  Pt will achieve Lt shoulder IR and ER AROM of 60 degrees in order to get dressed with less limitation. Baseline: ER: 25d, IR: 30d Goal status: INITIAL   4.  Pt will achieve global Lt shoulder MMT of 4+/5 or greater in order to progress his independent strengthening regimen with less limitation. Baseline: See MMT chart Goal status: INITIAL   5.  Pt will demonstrate ability to lift 10# overhead with Lt UE with 0-3/10 pain in order to put away storage items into his attic with less limitation. Baseline: Unable to lift with Lt UE due to post-op status Goal status: INITIAL       PLAN: PT FREQUENCY: 2x/week   PT DURATION: 8 weeks   PLANNED INTERVENTIONS: Therapeutic exercises, Therapeutic activity, Neuromuscular re-education, Patient/Family education, Self Care, Joint mobilization, Aquatic Therapy, Dry Needling,  Electrical stimulation, Spinal manipulation, Spinal mobilization, Cryotherapy, Moist heat, scar mobilization, Taping, Vasopneumatic device, Biofeedback, Ionotophoresis 4mg /ml Dexamethasone, Manual therapy, and Re-evaluation   PLAN FOR NEXT SESSION: Progress early shoulder ROM, strengthening in accordance with [facility] and [facility] Reverse Total Shoulder Arthoplasty Protocol   [facility] PTA 10/16/2021, 10:44 AM

## 2021-10-17 ENCOUNTER — Telehealth: Payer: Self-pay

## 2021-10-17 NOTE — Telephone Encounter (Signed)
Got it, thanks for the update

## 2021-10-17 NOTE — Telephone Encounter (Signed)
Patient's PCP office called triage. Patient is requesting pain medicine through his PCP. PCP will take over pain medicine starting today. They want to make sure that we DO NOT prescribe him pain medicine if he calls requesting any.  Thanks!

## 2021-10-21 ENCOUNTER — Ambulatory Visit (HOSPITAL_BASED_OUTPATIENT_CLINIC_OR_DEPARTMENT_OTHER)
Admission: RE | Admit: 2021-10-21 | Discharge: 2021-10-21 | Disposition: A | Discharge status: Home / Self Care | Payer: Medicare Other | Source: Ambulatory Visit | Attending: Internal Medicine | Admitting: Internal Medicine

## 2021-10-21 DIAGNOSIS — E559 Vitamin D deficiency, unspecified: Secondary | ICD-10-CM | POA: Insufficient documentation

## 2021-10-21 DIAGNOSIS — M8589 Other specified disorders of bone density and structure, multiple sites: Secondary | ICD-10-CM | POA: Diagnosis not present

## 2021-10-21 DIAGNOSIS — Z981 Arthrodesis status: Secondary | ICD-10-CM | POA: Diagnosis not present

## 2021-10-21 DIAGNOSIS — M353 Polymyalgia rheumatica: Secondary | ICD-10-CM | POA: Diagnosis not present

## 2021-10-21 DIAGNOSIS — Z87891 Personal history of nicotine dependence: Secondary | ICD-10-CM | POA: Insufficient documentation

## 2021-10-21 DIAGNOSIS — Z7952 Long term (current) use of systemic steroids: Secondary | ICD-10-CM | POA: Insufficient documentation

## 2021-10-21 DIAGNOSIS — T148XXA Other injury of unspecified body region, initial encounter: Secondary | ICD-10-CM | POA: Diagnosis not present

## 2021-10-21 DIAGNOSIS — M85851 Other specified disorders of bone density and structure, right thigh: Secondary | ICD-10-CM | POA: Diagnosis not present

## 2021-10-23 ENCOUNTER — Ambulatory Visit: Payer: Medicare Other

## 2021-10-23 DIAGNOSIS — M6281 Muscle weakness (generalized): Secondary | ICD-10-CM

## 2021-10-23 DIAGNOSIS — M25612 Stiffness of left shoulder, not elsewhere classified: Secondary | ICD-10-CM

## 2021-10-23 DIAGNOSIS — G8929 Other chronic pain: Secondary | ICD-10-CM | POA: Diagnosis not present

## 2021-10-23 DIAGNOSIS — R600 Localized edema: Secondary | ICD-10-CM

## 2021-10-23 DIAGNOSIS — S32010A Wedge compression fracture of first lumbar vertebra, initial encounter for closed fracture: Secondary | ICD-10-CM | POA: Diagnosis not present

## 2021-10-23 DIAGNOSIS — M25512 Pain in left shoulder: Secondary | ICD-10-CM | POA: Diagnosis not present

## 2021-10-23 DIAGNOSIS — Z96612 Presence of left artificial shoulder joint: Secondary | ICD-10-CM | POA: Diagnosis not present

## 2021-10-23 NOTE — Therapy (Signed)
OUTPATIENT PHYSICAL THERAPY TREATMENT NOTE   Patient Name: Victor Ramirez MRN: [id number] DOB:Oct 13, 1955, 66 y.o., male Today's Date: 10/23/2021  PCP: Wilfrid Lund, [place]   REFERRING PROVIDER: Cammy Copa, MD   PT End of Session - 10/23/21 1405     Visit Number 4    Number of Visits 17    Date for PT Re-Evaluation 12/12/21    Authorization Type BCBS    Authorization Time Period FOTO v6, v10    Progress Note Due on Visit 10    PT Start Time 1405    PT Stop Time 1455   10 minutes vasopneumatic treatment   PT Time Calculation (min) 50 min    Activity Tolerance Patient tolerated treatment well;Patient limited by pain    Behavior During Therapy WFL for tasks assessed/performed              Past Medical History:  Diagnosis Date   Arthritis    Hemochromatosis    Hypertension    Past Surgical History:  Procedure Laterality Date   ANTERIOR LATERAL LUMBAR FUSION WITH PERCUTANEOUS SCREW 1 LEVEL Right 11/27/2016   Procedure: Lumbar Three-Four Transpsoas Lumbar InterbodyFfusion;  Surgeon: Ditty, Loura Halt, MD;  Location: [facility] OR;  Service: Neurosurgery;  Laterality: Right;  L3-4 Transpsoas lumbar interbody fusion/L3-4 Pedicle screw fixation with posterolateral arthrodesis/minimally invasive decompression/Mazor   APPLICATION OF ROBOTIC ASSISTANCE FOR SPINAL PROCEDURE N/A 11/27/2016   Procedure: Lumbar Three-Four Pedicle Screw Fixation with Posterolateral Arthrodesis with Minimally Invasive Decompression with Application of Robotic Assistance;  Surgeon: Ditty, Loura Halt, MD;  Location: [facility] OR;  Service: Neurosurgery;  Laterality: N/A;   BACK SURGERY     x3    BACK SURGERY     battery surgery    REVERSE SHOULDER ARTHROPLASTY Left 09/16/2021   Procedure: LEFT REVERSE SHOULDER ARTHROPLASTY;  Surgeon: Cammy Copa, MD;  Location: [facility] OR;  Service: Orthopedics;  Laterality: Left;   spinal manipulation under anesthesia     TONSILLECTOMY     Patient Active Problem  List   Diagnosis Date Noted   Vitamin D deficiency 10/15/2021   Osteoporosis 10/15/2021   OA (osteoarthritis) of shoulder 09/16/2021   S/P reverse total shoulder arthroplasty, left 09/16/2021   Pain in right wrist 05/12/2021   RUQ abdominal pain    Acute pancreatitis without infection or necrosis 12/02/2020   Pain in left shoulder 11/28/2020   Rheumatoid factor positive 08/29/2020   Polymyalgia rheumatica (HCC) 08/08/2020   Polyarthralgia 08/08/2020   High risk medication use 08/08/2020   Lumbosacral spondylosis with radiculopathy 11/27/2016   Atrophy of muscle of right lower leg 02/04/2014   History of post-polio syndrome 02/02/2014   Pain of left lower extremity 02/02/2014   Chronic back pain 05/14/2013   HTN (hypertension) 05/14/2013   Headache 12/22/2011   Sleep apnea 11/17/2011   Elevation of level of transaminase or lactic acid dehydrogenase (LDH) 09/18/2010   Anxiety 09/08/2010   Tachycardia 09/08/2010   Hyperlipemia 03/01/2006   TIA (transient ischemic attack) 03/01/2006   Tobacco use disorder 03/01/2006   Depressive disorder 10/16/2005    THERAPY DIAG:  Chronic left shoulder pain  Decreased ROM of left shoulder  Muscle weakness (generalized)  Localized edema  REFERRING DIAG: W43.154 (ICD-10-CM) - History of arthroplasty of left shoulder  PERTINENT HISTORY: Left reverse shoulder arthroplasty with Dr. Rise Paganini on 09/16/2021, L1 fx from 09/05/2021 (first orthopedic appoint scheduled for 10/16/2021), HTN  PRECAUTIONS/RESTRICTIONS:    Shoulder: Active and passive ROM as tolerated to pain  tolerance (Utilizing [facility] and [facility] Reverse Total Shoulder Arthoplasty Protocol), avoid shoulder extension past neutral and combined shoulder adduction/IR x10-12 weeks  SUBJECTIVE: Pt reports he just came from the back doctor, where he was told he would need a lumbar MRI to get further information about his lumbar fx prior to performing any type of surgery. Pt adds  that he pushed his 100-lb dog up into his truck, which increased his Lt shoulder pain. He reports this pain has since resolved. He rates his current Lt shoulder pain as 2/10. He reports daily adherence to his HEP.  PAIN:  Are you having pain? Yes: NPRS scale: 0/10 Pain location: Lt shoulder Pain description: Achy, sharp Aggravating factors: reaching overhead, shoulder rotation Relieving factors: ice, compression, pain medications  OBJECTIVE:  DIAGNOSTIC FINDINGS:  10/01/2021: XR Shoulder Left: AP axillary outlet radiographs left shoulder reviewed.  Reverse shoulder  prosthesis in good position alignment with no complicating features.   PATIENT SURVEYS:  FOTO 32%, predicted 61% in 21 visits   COGNITION:           Overall cognitive status: Within functional limits for tasks assessed                                  SENSATION: Not tested   POSTURE: Forward shoulders/ head   UPPER EXTREMITY ROM:    A/PROM Right eval Left eval Left 10/23/2021  Shoulder flexion   122p!/140 (springy end-feel) 150  Shoulder abduction   130p!/ 140 (springy end-feel) 170, p! At end range  Shoulder internal rotation   30/40p! 58  Shoulder external rotation   25/34p! 50  (Blank rows = not tested)   UPPER EXTREMITY MMT:   MMT Right eval Left eval  Shoulder flexion 5/5 3+/5p!  Shoulder extension 5/5 3/5p!  Shoulder abduction 5/5 3+/5p!  Shoulder internal rotation 5/5 3+/5p!  Shoulder external rotation 5/5 3+/5p!  Middle trapezius      Lower trapezius      Latissimus dorsi      Elbow flexion 5/5 4/5p!  Elbow extension 5/5 5/5  Grip strength (lbs) 101 98  (Blank rows = not tested)     PALPATION:  TTP along surgical incision on Lt shoulder             TODAY'S TREATMENT:  10/10/2021: Demonstrated and issued HEP      HOME EXERCISE PROGRAM: Access Code: [email or web address] URL: [email or web address] Date: 10/10/2021 Prepared by: Carmelina Dane   Exercises - Shoulder Scaption  AAROM with Dowel (Mirrored)  - 2 x daily - 7 x weekly - 3 sets - 10 reps - 5 seconds hold - Standing Shoulder External Rotation AAROM with Dowel  - 2 x daily - 7 x weekly - 3 sets - 10 reps - 5 seconds hold - Isometric shoulder flexion at wall with 50% force  - 1 x daily - 7 x weekly - 2 sets - 10 reps - 5 seconds hold - Isometric shoulder external rotation at wall with 50% force  - 1 x daily - 7 x weekly - 2 sets - 10 reps - 5 seconds hold - Isometric shoulder abduction at wall with 50% force  - 1 x daily - 7 x weekly - 2 sets - 10 reps - 5 seconds hold   [facility] Adult PT Treatment:  DATE: 10/23/2021 Therapeutic Exercise: Supine Lt shoulder AAROM with dowel rod with 4# ankle weight to roughly 140 degrees 2x10 Standing shoulder scaption AAROM with physioball at wall with gentle PT perturbations at end range 4x30 seconds Standing Rt shoulder ER isometric hold in doorway 2x10 with 5-sec hold Standing Rt shoulder IR isometric hold in doorway 2x10 with 5-sec hold Standing Rt shoulder abduction isometric hold in doorway 2x10 with 5-sec hold Standing Rt shoulder flexion isometric hold in doorway x210 with 5-sec hold Manual Therapy: N/A Neuromuscular re-ed: N/A Therapeutic Activity: Re-assessment of objective measures with pt education Modalities: GameReady vasopneumatic treatment x10 minutes at 34d F to Lt shoulder Self Care: N/A   TREATMENT 8/17: Therapeutic Exercise: - seated scaption AAROM with dowel x20 - seated ER AROM with dowel x20 - dowel chest press in supine - 2x20 - AAROM flexion in supine - non-painful arc - 2x20 - Pulley - 2x20x (flexion) Manual Therapy: - Gentle flexion to end range - gentle ER Modalities: Vasopneumatic (Game Ready)   Location:  left shoulder Time:  10 minutes Pressure:  medium Temperature:  34 degrees   TREATMENT 8/15:  Therapeutic Exercise: - dowel chest press - 2x20 - AAROM flexion in supine -  non-painful arc - 2x20 - towel slide  - 2x20x - Pulley - 2x20x  Manual Therapy: - Gentle flexion to end range - gentle ER  Modalities:  Vasopneumatic (Game Ready)    Location:  left shoulder Time:  10 minutes Pressure:  medium Temperature:  38 degrees   ASSESSMENT:   CLINICAL IMPRESSION: Pt arrives 5 weeks 2 days s/p Lt reverse TSA. He continues to make good improvements in Lt shoulder AROM and tolerated progressed exercises well. He will continue to benefit from skilled PT to address his primary impairments and return to his prior level of function with less limitation.      OBJECTIVE IMPAIRMENTS decreased mobility, decreased ROM, decreased strength, hypomobility, increased edema, impaired flexibility, impaired UE functional use, improper body mechanics, postural dysfunction, and pain.    ACTIVITY LIMITATIONS carrying, lifting, sleeping, bathing, dressing, reach over head, and hygiene/grooming   PARTICIPATION LIMITATIONS: meal prep, cleaning, laundry, driving, shopping, community activity, and yard work   PERSONAL FACTORS 1-2 comorbidities: See medical hx  are also affecting patient's functional outcome.      GOALS: Goals reviewed with patient? Yes   SHORT TERM GOALS: Target date: 11/07/2021    Pt will report understanding and adherence to initial HEP in order to promote independence in the management of primary impairments. Baseline: HEP provided at eval 10/23/2021: ACHIEVED Goal status: INITIAL   2.  Pt will achieve Lt shoulder elevation AROM of 150 degrees in order to progress post-op protocol. Baseline: Flexion: 122d, abduction: 130d 10/23/2021: Flexion 150d, abduction: 170d Goal status: ACHIEVED     LONG TERM GOALS: Target date: 12/05/2021   Pt will achieve a FOTO score of 61% in order to demonstrate improved functional ability as it relates to his primary impairments. Baseline: 32% Goal status: INITIAL   2.  Pt will achieve Lt shoulder elevation AROM of 165  degrees in order to reach into overhead cabinets with less limitation. Baseline: Flexion: 122d, abductio: 130d 10/23/2021: Flexion: 150d, abduction 170d Goal status: IN PROGRESS   3.  Pt will achieve Lt shoulder IR and ER AROM of 60 degrees in order to get dressed with less limitation. Baseline: ER: 25d, IR: 30d 10/23/2021: ER: 50d, IR: 58d Goal status: IN PROGRESS   4.  Pt will achieve global  Lt shoulder MMT of 4+/5 or greater in order to progress his independent strengthening regimen with less limitation. Baseline: See MMT chart Goal status: INITIAL   5.  Pt will demonstrate ability to lift 10# overhead with Lt UE with 0-3/10 pain in order to put away storage items into his attic with less limitation. Baseline: Unable to lift with Lt UE due to post-op status Goal status: INITIAL       PLAN: PT FREQUENCY: 2x/week   PT DURATION: 8 weeks   PLANNED INTERVENTIONS: Therapeutic exercises, Therapeutic activity, Neuromuscular re-education, Patient/Family education, Self Care, Joint mobilization, Aquatic Therapy, Dry Needling, Electrical stimulation, Spinal manipulation, Spinal mobilization, Cryotherapy, Moist heat, scar mobilization, Taping, Vasopneumatic device, Biofeedback, Ionotophoresis 4mg /ml Dexamethasone, Manual therapy, and Re-evaluation   PLAN FOR NEXT SESSION: Progress early shoulder ROM, strengthening in accordance with [facility] and [facility] Reverse Total Shoulder Arthoplasty Protocol   [facility], PT, DPT 10/23/21 2:55 PM

## 2021-10-23 NOTE — Progress Notes (Signed)
Bone density test actually looks pretty good, with the caveat they can only really measure his femur and radius due to hardware in the spine. I don't think we need to start anything new right at the moment but he should also discuss this back injury with his orthopedist.

## 2021-10-24 ENCOUNTER — Ambulatory Visit: Payer: Medicare Other

## 2021-10-24 DIAGNOSIS — G8929 Other chronic pain: Secondary | ICD-10-CM | POA: Diagnosis not present

## 2021-10-24 DIAGNOSIS — Z96612 Presence of left artificial shoulder joint: Secondary | ICD-10-CM | POA: Diagnosis not present

## 2021-10-24 DIAGNOSIS — M6281 Muscle weakness (generalized): Secondary | ICD-10-CM | POA: Diagnosis not present

## 2021-10-24 DIAGNOSIS — R600 Localized edema: Secondary | ICD-10-CM

## 2021-10-24 DIAGNOSIS — M25612 Stiffness of left shoulder, not elsewhere classified: Secondary | ICD-10-CM | POA: Diagnosis not present

## 2021-10-24 DIAGNOSIS — M25512 Pain in left shoulder: Secondary | ICD-10-CM | POA: Diagnosis not present

## 2021-10-24 NOTE — Therapy (Signed)
OUTPATIENT PHYSICAL THERAPY TREATMENT NOTE   Patient Name: Victor Ramirez MRN: [id number] DOB:12/08/1955, 66 y.o., male Today's Date: 10/24/2021  PCP: Wilfrid Lund, [place]   REFERRING PROVIDER: Cammy Copa, MD   PT End of Session - 10/24/21 1137     Visit Number 5    Number of Visits 17    Date for PT Re-Evaluation 12/12/21    Authorization Type BCBS    Authorization Time Period FOTO v6, v10    Progress Note Due on Visit 10    PT Start Time 1137    PT Stop Time 1215    PT Time Calculation (min) 38 min    Activity Tolerance Patient tolerated treatment well;Patient limited by pain    Behavior During Therapy WFL for tasks assessed/performed               Past Medical History:  Diagnosis Date   Arthritis    Hemochromatosis    Hypertension    Past Surgical History:  Procedure Laterality Date   ANTERIOR LATERAL LUMBAR FUSION WITH PERCUTANEOUS SCREW 1 LEVEL Right 11/27/2016   Procedure: Lumbar Three-Four Transpsoas Lumbar InterbodyFfusion;  Surgeon: Ditty, Loura Halt, MD;  Location: [facility] OR;  Service: Neurosurgery;  Laterality: Right;  L3-4 Transpsoas lumbar interbody fusion/L3-4 Pedicle screw fixation with posterolateral arthrodesis/minimally invasive decompression/Mazor   APPLICATION OF ROBOTIC ASSISTANCE FOR SPINAL PROCEDURE N/A 11/27/2016   Procedure: Lumbar Three-Four Pedicle Screw Fixation with Posterolateral Arthrodesis with Minimally Invasive Decompression with Application of Robotic Assistance;  Surgeon: Ditty, Loura Halt, MD;  Location: [facility] OR;  Service: Neurosurgery;  Laterality: N/A;   BACK SURGERY     x3    BACK SURGERY     battery surgery    REVERSE SHOULDER ARTHROPLASTY Left 09/16/2021   Procedure: LEFT REVERSE SHOULDER ARTHROPLASTY;  Surgeon: Cammy Copa, MD;  Location: [facility] OR;  Service: Orthopedics;  Laterality: Left;   spinal manipulation under anesthesia     TONSILLECTOMY     Patient Active Problem List   Diagnosis Date Noted    Vitamin D deficiency 10/15/2021   Osteoporosis 10/15/2021   OA (osteoarthritis) of shoulder 09/16/2021   S/P reverse total shoulder arthroplasty, left 09/16/2021   Pain in right wrist 05/12/2021   RUQ abdominal pain    Acute pancreatitis without infection or necrosis 12/02/2020   Pain in left shoulder 11/28/2020   Rheumatoid factor positive 08/29/2020   Polymyalgia rheumatica (HCC) 08/08/2020   Polyarthralgia 08/08/2020   High risk medication use 08/08/2020   Lumbosacral spondylosis with radiculopathy 11/27/2016   Atrophy of muscle of right lower leg 02/04/2014   History of post-polio syndrome 02/02/2014   Pain of left lower extremity 02/02/2014   Chronic back pain 05/14/2013   HTN (hypertension) 05/14/2013   Headache 12/22/2011   Sleep apnea 11/17/2011   Elevation of level of transaminase or lactic acid dehydrogenase (LDH) 09/18/2010   Anxiety 09/08/2010   Tachycardia 09/08/2010   Hyperlipemia 03/01/2006   TIA (transient ischemic attack) 03/01/2006   Tobacco use disorder 03/01/2006   Depressive disorder 10/16/2005    THERAPY DIAG:  Chronic left shoulder pain  Decreased ROM of left shoulder  Muscle weakness (generalized)  Localized edema  REFERRING DIAG: K93.267 (ICD-10-CM) - History of arthroplasty of left shoulder  PERTINENT HISTORY: Left reverse shoulder arthroplasty with Dr. Rise Paganini on 09/16/2021, L1 fx from 09/05/2021 (first orthopedic appoint scheduled for 10/16/2021), HTN  PRECAUTIONS/RESTRICTIONS:    Shoulder: Active and passive ROM as tolerated to pain tolerance (Utilizing [place] and  [facility] Reverse Total Shoulder Arthoplasty Protocol), avoid shoulder extension past neutral and combined shoulder adduction/IR x10-12 weeks  SUBJECTIVE: Pt reports mild Lt shoulder pain today, adding that the treatment yesterday felt good.  PAIN:  Are you having pain? Yes: NPRS scale: 2/10 Pain location: Lt shoulder Pain description: Achy, sharp Aggravating factors:  reaching overhead, shoulder rotation Relieving factors: ice, compression, pain medications  OBJECTIVE:  DIAGNOSTIC FINDINGS:  10/01/2021: XR Shoulder Left: AP axillary outlet radiographs left shoulder reviewed.  Reverse shoulder  prosthesis in good position alignment with no complicating features.   PATIENT SURVEYS:  FOTO 32%, predicted 61% in 21 visits   COGNITION:           Overall cognitive status: Within functional limits for tasks assessed                                  SENSATION: Not tested   POSTURE: Forward shoulders/ head   UPPER EXTREMITY ROM:    A/PROM Right eval Left eval Left 10/23/2021  Shoulder flexion   122p!/140 (springy end-feel) 150  Shoulder abduction   130p!/ 140 (springy end-feel) 170, p! At end range  Shoulder internal rotation   30/40p! 58  Shoulder external rotation   25/34p! 50  (Blank rows = not tested)   UPPER EXTREMITY MMT:   MMT Right eval Left eval  Shoulder flexion 5/5 3+/5p!  Shoulder extension 5/5 3/5p!  Shoulder abduction 5/5 3+/5p!  Shoulder internal rotation 5/5 3+/5p!  Shoulder external rotation 5/5 3+/5p!  Middle trapezius      Lower trapezius      Latissimus dorsi      Elbow flexion 5/5 4/5p!  Elbow extension 5/5 5/5  Grip strength (lbs) 101 98  (Blank rows = not tested)     PALPATION:  TTP along surgical incision on Lt shoulder             TODAY'S TREATMENT:  10/10/2021: Demonstrated and issued HEP      HOME EXERCISE PROGRAM: Access Code: [email or web address] URL: [email or web address] Date: 10/10/2021 Prepared by: Carmelina Dane   Exercises - Shoulder Scaption AAROM with Dowel (Mirrored)  - 2 x daily - 7 x weekly - 3 sets - 10 reps - 5 seconds hold - Standing Shoulder External Rotation AAROM with Dowel  - 2 x daily - 7 x weekly - 3 sets - 10 reps - 5 seconds hold - Isometric shoulder flexion at wall with 50% force  - 1 x daily - 7 x weekly - 2 sets - 10 reps - 5 seconds hold - Isometric shoulder  external rotation at wall with 50% force  - 1 x daily - 7 x weekly - 2 sets - 10 reps - 5 seconds hold - Isometric shoulder abduction at wall with 50% force  - 1 x daily - 7 x weekly - 2 sets - 10 reps - 5 seconds hold   [facility] Adult PT Treatment:                                                DATE: 10/24/2021 Therapeutic Exercise: Standing shoulder flexion AAROM with physioball at wall with gentle PT perturbations at end range 4x30 seconds Bent-over Lt shoulder pendulums 3x30sec Standing Lt shoulder scaption AAROM with UE Ranger to roughly 100 degrees of  elevation 2x10 with 5-sec hold Standing Rt shoulder ER isometric hold in doorway 2x10 with 5-sec hold Standing Rt shoulder IR isometric hold in doorway 2x10 with 5-sec hold Standing Rt shoulder abduction isometric hold in doorway 2x10 with 5-sec hold Standing Rt shoulder flexion isometric hold in doorway x210 with 5-sec hold Manual Therapy: N/A Neuromuscular re-ed: N/A Therapeutic Activity: N/A Modalities: N/A Self Care: N/A  [facility] Adult PT Treatment:                                                DATE: 10/23/2021 Therapeutic Exercise: Supine Lt shoulder AAROM with dowel rod with 4# ankle weight to roughly 140 degrees 2x10 Standing shoulder scaption AAROM with physioball at wall with gentle PT perturbations at end range 4x30 seconds Standing Rt shoulder ER isometric hold in doorway 2x10 with 5-sec hold Standing Rt shoulder IR isometric hold in doorway 2x10 with 5-sec hold Standing Rt shoulder abduction isometric hold in doorway 2x10 with 5-sec hold Standing Rt shoulder flexion isometric hold in doorway x210 with 5-sec hold Manual Therapy: N/A Neuromuscular re-ed: N/A Therapeutic Activity: Re-assessment of objective measures with pt education Modalities: GameReady vasopneumatic treatment x10 minutes at 34d F to Lt shoulder Self Care: N/A   TREATMENT 8/17: Therapeutic Exercise: - seated scaption AAROM with dowel x20 - seated  ER AROM with dowel x20 - dowel chest press in supine - 2x20 - AAROM flexion in supine - non-painful arc - 2x20 - Pulley - 2x20x (flexion) Manual Therapy: - Gentle flexion to end range - gentle ER Modalities: Vasopneumatic (Game Ready)   Location:  left shoulder Time:  10 minutes Pressure:  medium Temperature:  34 degrees     ASSESSMENT:   CLINICAL IMPRESSION: Pt presents 5 weeks 3 days s/p Lt reverse TSA. Pt continues to respond well to isometric strengthening and AAROM exercises. He will continue to benefit from skilled PT to address his primary impairments and return to his prior level of function with less limitation.     OBJECTIVE IMPAIRMENTS decreased mobility, decreased ROM, decreased strength, hypomobility, increased edema, impaired flexibility, impaired UE functional use, improper body mechanics, postural dysfunction, and pain.    ACTIVITY LIMITATIONS carrying, lifting, sleeping, bathing, dressing, reach over head, and hygiene/grooming   PARTICIPATION LIMITATIONS: meal prep, cleaning, laundry, driving, shopping, community activity, and yard work   PERSONAL FACTORS 1-2 comorbidities: See medical hx  are also affecting patient's functional outcome.      GOALS: Goals reviewed with patient? Yes   SHORT TERM GOALS: Target date: 11/07/2021    Pt will report understanding and adherence to initial HEP in order to promote independence in the management of primary impairments. Baseline: HEP provided at eval 10/23/2021: ACHIEVED Goal status: INITIAL   2.  Pt will achieve Lt shoulder elevation AROM of 150 degrees in order to progress post-op protocol. Baseline: Flexion: 122d, abduction: 130d 10/23/2021: Flexion 150d, abduction: 170d Goal status: ACHIEVED     LONG TERM GOALS: Target date: 12/05/2021   Pt will achieve a FOTO score of 61% in order to demonstrate improved functional ability as it relates to his primary impairments. Baseline: 32% Goal status: INITIAL   2.  Pt  will achieve Lt shoulder elevation AROM of 165 degrees in order to reach into overhead cabinets with less limitation. Baseline: Flexion: 122d, abductio: 130d 10/23/2021: Flexion: 150d, abduction 170d Goal status: IN  PROGRESS   3.  Pt will achieve Lt shoulder IR and ER AROM of 60 degrees in order to get dressed with less limitation. Baseline: ER: 25d, IR: 30d 10/23/2021: ER: 50d, IR: 58d Goal status: IN PROGRESS   4.  Pt will achieve global Lt shoulder MMT of 4+/5 or greater in order to progress his independent strengthening regimen with less limitation. Baseline: See MMT chart Goal status: INITIAL   5.  Pt will demonstrate ability to lift 10# overhead with Lt UE with 0-3/10 pain in order to put away storage items into his attic with less limitation. Baseline: Unable to lift with Lt UE due to post-op status Goal status: INITIAL       PLAN: PT FREQUENCY: 2x/week   PT DURATION: 8 weeks   PLANNED INTERVENTIONS: Therapeutic exercises, Therapeutic activity, Neuromuscular re-education, Patient/Family education, Self Care, Joint mobilization, Aquatic Therapy, Dry Needling, Electrical stimulation, Spinal manipulation, Spinal mobilization, Cryotherapy, Moist heat, scar mobilization, Taping, Vasopneumatic device, Biofeedback, Ionotophoresis 4mg /ml Dexamethasone, Manual therapy, and Re-evaluation   PLAN FOR NEXT SESSION: Progress early shoulder ROM, strengthening in accordance with [facility] and [facility] Reverse Total Shoulder Arthoplasty Protocol   [facility], PT, DPT 10/24/21 12:21 PM

## 2021-10-28 ENCOUNTER — Ambulatory Visit: Payer: Medicare Other

## 2021-10-28 DIAGNOSIS — M6281 Muscle weakness (generalized): Secondary | ICD-10-CM | POA: Diagnosis not present

## 2021-10-28 DIAGNOSIS — G8929 Other chronic pain: Secondary | ICD-10-CM | POA: Diagnosis not present

## 2021-10-28 DIAGNOSIS — R600 Localized edema: Secondary | ICD-10-CM | POA: Diagnosis not present

## 2021-10-28 DIAGNOSIS — M25612 Stiffness of left shoulder, not elsewhere classified: Secondary | ICD-10-CM

## 2021-10-28 DIAGNOSIS — M25512 Pain in left shoulder: Secondary | ICD-10-CM | POA: Diagnosis not present

## 2021-10-28 DIAGNOSIS — Z96612 Presence of left artificial shoulder joint: Secondary | ICD-10-CM | POA: Diagnosis not present

## 2021-10-28 NOTE — Therapy (Signed)
OUTPATIENT PHYSICAL THERAPY TREATMENT NOTE   Patient Name: Victor Ramirez MRN: [id number] DOB:November 06, 1955, 66 y.o., male Today's Date: 10/28/2021  PCP: Wilfrid Lund, [place]   REFERRING PROVIDER: Cammy Copa, MD   PT End of Session - 10/28/21 1359     Visit Number 6    Number of Visits 17    Date for PT Re-Evaluation 12/12/21    Authorization Type BCBS    Authorization Time Period FOTO v6, v10    Progress Note Due on Visit 10    PT Start Time 1400    PT Stop Time 1445    PT Time Calculation (min) 45 min    Activity Tolerance Patient tolerated treatment well    Behavior During Therapy WFL for tasks assessed/performed                Past Medical History:  Diagnosis Date   Arthritis    Hemochromatosis    Hypertension    Past Surgical History:  Procedure Laterality Date   ANTERIOR LATERAL LUMBAR FUSION WITH PERCUTANEOUS SCREW 1 LEVEL Right 11/27/2016   Procedure: Lumbar Three-Four Transpsoas Lumbar InterbodyFfusion;  Surgeon: Ditty, Loura Halt, MD;  Location: [facility] OR;  Service: Neurosurgery;  Laterality: Right;  L3-4 Transpsoas lumbar interbody fusion/L3-4 Pedicle screw fixation with posterolateral arthrodesis/minimally invasive decompression/Mazor   APPLICATION OF ROBOTIC ASSISTANCE FOR SPINAL PROCEDURE N/A 11/27/2016   Procedure: Lumbar Three-Four Pedicle Screw Fixation with Posterolateral Arthrodesis with Minimally Invasive Decompression with Application of Robotic Assistance;  Surgeon: Ditty, Loura Halt, MD;  Location: [facility] OR;  Service: Neurosurgery;  Laterality: N/A;   BACK SURGERY     x3    BACK SURGERY     battery surgery    REVERSE SHOULDER ARTHROPLASTY Left 09/16/2021   Procedure: LEFT REVERSE SHOULDER ARTHROPLASTY;  Surgeon: Cammy Copa, MD;  Location: [facility] OR;  Service: Orthopedics;  Laterality: Left;   spinal manipulation under anesthesia     TONSILLECTOMY     Patient Active Problem List   Diagnosis Date Noted   Vitamin D deficiency  10/15/2021   Osteoporosis 10/15/2021   OA (osteoarthritis) of shoulder 09/16/2021   S/P reverse total shoulder arthroplasty, left 09/16/2021   Pain in right wrist 05/12/2021   RUQ abdominal pain    Acute pancreatitis without infection or necrosis 12/02/2020   Pain in left shoulder 11/28/2020   Rheumatoid factor positive 08/29/2020   Polymyalgia rheumatica (HCC) 08/08/2020   Polyarthralgia 08/08/2020   High risk medication use 08/08/2020   Lumbosacral spondylosis with radiculopathy 11/27/2016   Atrophy of muscle of right lower leg 02/04/2014   History of post-polio syndrome 02/02/2014   Pain of left lower extremity 02/02/2014   Chronic back pain 05/14/2013   HTN (hypertension) 05/14/2013   Headache 12/22/2011   Sleep apnea 11/17/2011   Elevation of level of transaminase or lactic acid dehydrogenase (LDH) 09/18/2010   Anxiety 09/08/2010   Tachycardia 09/08/2010   Hyperlipemia 03/01/2006   TIA (transient ischemic attack) 03/01/2006   Tobacco use disorder 03/01/2006   Depressive disorder 10/16/2005    THERAPY DIAG:  Chronic left shoulder pain  Decreased ROM of left shoulder  Muscle weakness (generalized)  Localized edema  REFERRING DIAG: G26.948 (ICD-10-CM) - History of arthroplasty of left shoulder  PERTINENT HISTORY: Left reverse shoulder arthroplasty with Dr. Rise Paganini on 09/16/2021, L1 fx from 09/05/2021 (first orthopedic appoint scheduled for 10/16/2021), HTN  PRECAUTIONS/RESTRICTIONS:    Shoulder: Active and passive ROM as tolerated to pain tolerance (Utilizing [facility] and [facility]  Reverse Total Shoulder Arthoplasty Protocol), avoid shoulder extension past neutral and combined shoulder adduction/IR x10-12 weeks  SUBJECTIVE: Pt reports his shoulder is doing great, although he continues to have LBP. He reports continued adherence to his HEP.  PAIN:  Are you having pain? Yes: NPRS scale: 0/10 Pain location: Lt shoulder Pain description: Achy,  sharp Aggravating factors: reaching overhead, shoulder rotation Relieving factors: ice, compression, pain medications  OBJECTIVE:  DIAGNOSTIC FINDINGS:  10/01/2021: XR Shoulder Left: AP axillary outlet radiographs left shoulder reviewed.  Reverse shoulder  prosthesis in good position alignment with no complicating features.   PATIENT SURVEYS:  FOTO 32%, predicted 61% in 21 visits 10/28/2021: FOTO: 47%   COGNITION:           Overall cognitive status: Within functional limits for tasks assessed                                  SENSATION: Not tested   POSTURE: Forward shoulders/ head   UPPER EXTREMITY ROM:    A/PROM Right eval Left eval Left 10/23/2021  Shoulder flexion   122p!/140 (springy end-feel) 150  Shoulder abduction   130p!/ 140 (springy end-feel) 170, p! At end range  Shoulder internal rotation   30/40p! 58  Shoulder external rotation   25/34p! 50  (Blank rows = not tested)   UPPER EXTREMITY MMT:   MMT Right eval Left eval  Shoulder flexion 5/5 3+/5p!  Shoulder extension 5/5 3/5p!  Shoulder abduction 5/5 3+/5p!  Shoulder internal rotation 5/5 3+/5p!  Shoulder external rotation 5/5 3+/5p!  Middle trapezius      Lower trapezius      Latissimus dorsi      Elbow flexion 5/5 4/5p!  Elbow extension 5/5 5/5  Grip strength (lbs) 101 98  (Blank rows = not tested)     PALPATION:  TTP along surgical incision on Lt shoulder             TODAY'S TREATMENT:  10/10/2021: Demonstrated and issued HEP      HOME EXERCISE PROGRAM: Access Code: [email or web address] URL: [email or web address] Date: 10/10/2021 Prepared by: Vanessa Opelika   Exercises - Shoulder Scaption AAROM with Dowel (Mirrored)  - 2 x daily - 7 x weekly - 3 sets - 10 reps - 5 seconds hold - Standing Shoulder External Rotation AAROM with Dowel  - 2 x daily - 7 x weekly - 3 sets - 10 reps - 5 seconds hold - Isometric shoulder flexion at wall with 50% force  - 1 x daily - 7 x weekly - 2 sets - 10  reps - 5 seconds hold - Isometric shoulder external rotation at wall with 50% force  - 1 x daily - 7 x weekly - 2 sets - 10 reps - 5 seconds hold - Isometric shoulder abduction at wall with 50% force  - 1 x daily - 7 x weekly - 2 sets - 10 reps - 5 seconds hold  [facility] Adult PT Treatment:                                                DATE: 10/28/2021 Therapeutic Exercise: Supine Lt shoulder scaption AAROM with dowel 2x10 with 5-sec hold Supine IR/ER AROM in scapular plane not surpassing 50 degrees in either direction 3x10 Standing shoulder flexion AAROM  with physioball at wall with gentle PT perturbations at end range 4x30 seconds Bent-over Lt shoulder pendulums 3x30sec Seated biceps curls with 3# dumbbells 3x12 Manual Therapy: N/A Neuromuscular re-ed: N/A Therapeutic Activity: N/A Modalities: N/A Self Care: N/A   [facility] Adult PT Treatment:                                                DATE: 10/24/2021 Therapeutic Exercise: Standing shoulder flexion AAROM with physioball at wall with gentle PT perturbations at end range 4x30 seconds Bent-over Lt shoulder pendulums 3x30sec Standing Lt shoulder scaption AAROM with UE Ranger to roughly 100 degrees of elevation 2x10 with 5-sec hold Standing Rt shoulder ER isometric hold in doorway 2x10 with 5-sec hold Standing Rt shoulder IR isometric hold in doorway 2x10 with 5-sec hold Standing Rt shoulder abduction isometric hold in doorway 2x10 with 5-sec hold Standing Rt shoulder flexion isometric hold in doorway x210 with 5-sec hold Manual Therapy: N/A Neuromuscular re-ed: N/A Therapeutic Activity: N/A Modalities: N/A Self Care: N/A  [facility] Adult PT Treatment:                                                DATE: 10/23/2021 Therapeutic Exercise: Supine Lt shoulder AAROM with dowel rod with 4# ankle weight to roughly 140 degrees 2x10 Standing shoulder scaption AAROM with physioball at wall with gentle PT perturbations at end range 4x30  seconds Standing Rt shoulder ER isometric hold in doorway 2x10 with 5-sec hold Standing Rt shoulder IR isometric hold in doorway 2x10 with 5-sec hold Standing Rt shoulder abduction isometric hold in doorway 2x10 with 5-sec hold Standing Rt shoulder flexion isometric hold in doorway x210 with 5-sec hold Manual Therapy: N/A Neuromuscular re-ed: N/A Therapeutic Activity: Re-assessment of objective measures with pt education Modalities: GameReady vasopneumatic treatment x10 minutes at 34d F to Lt shoulder Self Care: N/A      ASSESSMENT:   CLINICAL IMPRESSION: Pt presents 6 weeks s/p Lt reverse TSA. In accordance with [facility] reverse TSA protocol, treatment may proceed to phase II on rehab. Pt continues to demonstrate visually improved AAROM in all planes with no pain. He will continue to benefit from skilled PT to address his primary impairments and return to his prior level of function with less limitation.     OBJECTIVE IMPAIRMENTS decreased mobility, decreased ROM, decreased strength, hypomobility, increased edema, impaired flexibility, impaired UE functional use, improper body mechanics, postural dysfunction, and pain.    ACTIVITY LIMITATIONS carrying, lifting, sleeping, bathing, dressing, reach over head, and hygiene/grooming   PARTICIPATION LIMITATIONS: meal prep, cleaning, laundry, driving, shopping, community activity, and yard work   PERSONAL FACTORS 1-2 comorbidities: See medical hx  are also affecting patient's functional outcome.      GOALS: Goals reviewed with patient? Yes   SHORT TERM GOALS: Target date: 11/07/2021    Pt will report understanding and adherence to initial HEP in order to promote independence in the management of primary impairments. Baseline: HEP provided at eval 10/23/2021: ACHIEVED Goal status: INITIAL   2.  Pt will achieve Lt shoulder elevation AROM of 150 degrees in order to progress post-op protocol. Baseline: Flexion: 122d,  abduction: 130d 10/23/2021: Flexion 150d, abduction: 170d Goal status: ACHIEVED  LONG TERM GOALS: Target date: 12/05/2021   Pt will achieve a FOTO score of 61% in order to demonstrate improved functional ability as it relates to his primary impairments. Baseline: 32% 10/28/2021: 47% Goal status: IN PROGRESS   2.  Pt will achieve Lt shoulder elevation AROM of 165 degrees in order to reach into overhead cabinets with less limitation. Baseline: Flexion: 122d, abductio: 130d 10/23/2021: Flexion: 150d, abduction 170d Goal status: IN PROGRESS   3.  Pt will achieve Lt shoulder IR and ER AROM of 60 degrees in order to get dressed with less limitation. Baseline: ER: 25d, IR: 30d 10/23/2021: ER: 50d, IR: 58d Goal status: IN PROGRESS   4.  Pt will achieve global Lt shoulder MMT of 4+/5 or greater in order to progress his independent strengthening regimen with less limitation. Baseline: See MMT chart Goal status: INITIAL   5.  Pt will demonstrate ability to lift 10# overhead with Lt UE with 0-3/10 pain in order to put away storage items into his attic with less limitation. Baseline: Unable to lift with Lt UE due to post-op status Goal status: INITIAL       PLAN: PT FREQUENCY: 2x/week   PT DURATION: 8 weeks   PLANNED INTERVENTIONS: Therapeutic exercises, Therapeutic activity, Neuromuscular re-education, Patient/Family education, Self Care, Joint mobilization, Aquatic Therapy, Dry Needling, Electrical stimulation, Spinal manipulation, Spinal mobilization, Cryotherapy, Moist heat, scar mobilization, Taping, Vasopneumatic device, Biofeedback, Ionotophoresis 4mg /ml Dexamethasone, Manual therapy, and Re-evaluation   PLAN FOR NEXT SESSION: Progress early shoulder ROM, strengthening in accordance with [facility] and [facility] Reverse Total Shoulder Arthoplasty Protocol   [facility], PT, DPT 10/28/21 2:52 PM

## 2021-10-29 ENCOUNTER — Encounter: Payer: Self-pay | Admitting: Orthopedic Surgery

## 2021-10-29 ENCOUNTER — Ambulatory Visit (INDEPENDENT_AMBULATORY_CARE_PROVIDER_SITE_OTHER): Payer: Medicare Other | Admitting: Orthopedic Surgery

## 2021-10-29 DIAGNOSIS — Z96612 Presence of left artificial shoulder joint: Secondary | ICD-10-CM

## 2021-10-29 NOTE — Progress Notes (Signed)
Post-Op Visit Note   Patient: Victor Ramirez           Date of Birth: 02/05/1956           MRN: [id number] Visit Date: 10/29/2021 PCP: Wilfrid Lund, PA   Assessment & Plan:  Chief Complaint:  Chief Complaint  Patient presents with   Other    09/16/21 (6w 1d)Left Reverse Shoulder Arthroplasty     Visit Diagnoses:  1. History of arthroplasty of left shoulder     Plan: Laquon is a 66 year old patient is doing 6 weeks out left reverse shoulder replacement.  He states he is doing well with no problems.  In therapy 2 times a week.  Has 6 more weeks of physical therapy scheduled.  Doing isometrics as well as range of motion exercises.  On examination today passive range of motion is 40/90/120.  Deltoid functions.  Incision intact.  Plan at this time is to continue with therapy and 6-week return for final check.  Cautioned him against doing any lifting more than 20 pounds with that left arm.  Follow-Up Instructions: Return in about 6 weeks (around 12/10/2021).   Orders:  No orders of the defined types were placed in this encounter.  No orders of the defined types were placed in this encounter.   Imaging: No results found.  PMFS History: Patient Active Problem List   Diagnosis Date Noted   Vitamin D deficiency 10/15/2021   Osteoporosis 10/15/2021   OA (osteoarthritis) of shoulder 09/16/2021   S/P reverse total shoulder arthroplasty, left 09/16/2021   Pain in right wrist 05/12/2021   RUQ abdominal pain    Acute pancreatitis without infection or necrosis 12/02/2020   Pain in left shoulder 11/28/2020   Rheumatoid factor positive 08/29/2020   Polymyalgia rheumatica (HCC) 08/08/2020   Polyarthralgia 08/08/2020   High risk medication use 08/08/2020   Lumbosacral spondylosis with radiculopathy 11/27/2016   Atrophy of muscle of right lower leg 02/04/2014   History of post-polio syndrome 02/02/2014   Pain of left lower extremity 02/02/2014   Chronic back pain 05/14/2013    HTN (hypertension) 05/14/2013   Headache 12/22/2011   Sleep apnea 11/17/2011   Elevation of level of transaminase or lactic acid dehydrogenase (LDH) 09/18/2010   Anxiety 09/08/2010   Tachycardia 09/08/2010   Hyperlipemia 03/01/2006   TIA (transient ischemic attack) 03/01/2006   Tobacco use disorder 03/01/2006   Depressive disorder 10/16/2005   Past Medical History:  Diagnosis Date   Arthritis    Hemochromatosis    Hypertension     Family History  Problem Relation Age of Onset   Arthritis Mother    Thyroid cancer Mother    Melanoma Father    Fibromyalgia Sister    Cancer Brother    Prostate cancer Brother    Williams syndrome Son     Past Surgical History:  Procedure Laterality Date   ANTERIOR LATERAL LUMBAR FUSION WITH PERCUTANEOUS SCREW 1 LEVEL Right 11/27/2016   Procedure: Lumbar Three-Four Transpsoas Lumbar InterbodyFfusion;  Surgeon: Ditty, Loura Halt, MD;  Location: [facility] OR;  Service: Neurosurgery;  Laterality: Right;  L3-4 Transpsoas lumbar interbody fusion/L3-4 Pedicle screw fixation with posterolateral arthrodesis/minimally invasive decompression/Mazor   APPLICATION OF ROBOTIC ASSISTANCE FOR SPINAL PROCEDURE N/A 11/27/2016   Procedure: Lumbar Three-Four Pedicle Screw Fixation with Posterolateral Arthrodesis with Minimally Invasive Decompression with Application of Robotic Assistance;  Surgeon: Ditty, Loura Halt, MD;  Location: [facility] OR;  Service: Neurosurgery;  Laterality: N/A;   BACK SURGERY  x3    BACK SURGERY     battery surgery    REVERSE SHOULDER ARTHROPLASTY Left 09/16/2021   Procedure: LEFT REVERSE SHOULDER ARTHROPLASTY;  Surgeon: Cammy Copa, MD;  Location: [facility] OR;  Service: Orthopedics;  Laterality: Left;   spinal manipulation under anesthesia     TONSILLECTOMY     Social History   Occupational History   Not on file  Tobacco Use   Smoking status: Former    Packs/day: 2.00    Years: 35.00    Total pack years: 70.00    Types: Cigarettes     Quit date: 2007    Years since quitting: 16.6   Smokeless tobacco: Never  Vaping Use   Vaping Use: Never used  Substance and Sexual Activity   Alcohol use: Yes    Alcohol/week: 7.0 - 10.0 standard drinks of alcohol    Types: 7 - 10 Cans of beer per week   Drug use: No   Sexual activity: Not on file

## 2021-10-30 ENCOUNTER — Ambulatory Visit: Payer: Medicare Other

## 2021-10-30 DIAGNOSIS — M25612 Stiffness of left shoulder, not elsewhere classified: Secondary | ICD-10-CM | POA: Diagnosis not present

## 2021-10-30 DIAGNOSIS — M25512 Pain in left shoulder: Secondary | ICD-10-CM | POA: Diagnosis not present

## 2021-10-30 DIAGNOSIS — G8929 Other chronic pain: Secondary | ICD-10-CM

## 2021-10-30 DIAGNOSIS — R600 Localized edema: Secondary | ICD-10-CM

## 2021-10-30 DIAGNOSIS — M6281 Muscle weakness (generalized): Secondary | ICD-10-CM

## 2021-10-30 DIAGNOSIS — Z96612 Presence of left artificial shoulder joint: Secondary | ICD-10-CM | POA: Diagnosis not present

## 2021-10-30 NOTE — Therapy (Signed)
OUTPATIENT PHYSICAL THERAPY TREATMENT NOTE   Patient Name: Victor Ramirez MRN: [id number] DOB:[birth year], 66 y.o., male Today's Date: 10/30/2021  PCP: Lois Huxley, [place]   REFERRING PROVIDER: Meredith Pel, MD   PT End of Session - 10/30/21 1005     Visit Number 7    Number of Visits 17    Date for PT Re-Evaluation 12/12/21    Authorization Type BCBS    Authorization Time Period FOTO v6, v10    Progress Note Due on Visit 10    PT Start Time 1004    PT Stop Time 1042    PT Time Calculation (min) 38 min    Activity Tolerance Patient tolerated treatment well    Behavior During Therapy WFL for tasks assessed/performed                 Past Medical History:  Diagnosis Date   Arthritis    Hemochromatosis    Hypertension    Past Surgical History:  Procedure Laterality Date   ANTERIOR LATERAL LUMBAR FUSION WITH PERCUTANEOUS SCREW 1 LEVEL Right 11/27/2016   Procedure: Lumbar Three-Four Transpsoas Lumbar InterbodyFfusion;  Surgeon: Ditty, Kevan Ny, MD;  Location: [place];  Service: Neurosurgery;  Laterality: Right;  L3-4 Transpsoas lumbar interbody fusion/L3-4 Pedicle screw fixation with posterolateral arthrodesis/minimally invasive decompression/Mazor   APPLICATION OF ROBOTIC ASSISTANCE FOR SPINAL PROCEDURE N/A 11/27/2016   Procedure: Lumbar Three-Four Pedicle Screw Fixation with Posterolateral Arthrodesis with Minimally Invasive Decompression with Application of Robotic Assistance;  Surgeon: Ditty, Kevan Ny, MD;  Location: [place];  Service: Neurosurgery;  Laterality: N/A;   BACK SURGERY     x3    BACK SURGERY     battery surgery    REVERSE SHOULDER ARTHROPLASTY Left 09/16/2021   Procedure: LEFT REVERSE SHOULDER ARTHROPLASTY;  Surgeon: Meredith Pel, MD;  Location: [place];  Service: Orthopedics;  Laterality: Left;   spinal manipulation under anesthesia     TONSILLECTOMY     Patient Active Problem List   Diagnosis Date Noted   Vitamin D deficiency  10/15/2021   Osteoporosis 10/15/2021   OA (osteoarthritis) of shoulder 09/16/2021   S/P reverse total shoulder arthroplasty, left 09/16/2021   Pain in right wrist 05/12/2021   RUQ abdominal pain    Acute pancreatitis without infection or necrosis 12/02/2020   Pain in left shoulder 11/28/2020   Rheumatoid factor positive 08/29/2020   Polymyalgia rheumatica ([place]) 08/08/2020   Polyarthralgia 08/08/2020   High risk medication use 08/08/2020   Lumbosacral spondylosis with radiculopathy 11/27/2016   Atrophy of muscle of right lower leg 02/04/2014   History of post-polio syndrome 02/02/2014   Pain of left lower extremity 02/02/2014   Chronic back pain 05/14/2013   HTN (hypertension) 05/14/2013   Headache 12/22/2011   Sleep apnea 11/17/2011   Elevation of level of transaminase or lactic acid dehydrogenase (LDH) 09/18/2010   Anxiety 09/08/2010   Tachycardia 09/08/2010   Hyperlipemia 03/01/2006   TIA (transient ischemic attack) 03/01/2006   Tobacco use disorder 03/01/2006   Depressive disorder 10/16/2005    THERAPY DIAG:  Chronic left shoulder pain  Decreased ROM of left shoulder  Muscle weakness (generalized)  Localized edema  REFERRING DIAG: Y30.160 (ICD-10-CM) - History of arthroplasty of left shoulder  PERTINENT HISTORY: Left reverse shoulder arthroplasty with Dr. Marcene Duos on 09/16/2021, L1 fx from 09/05/2021 (first orthopedic appoint scheduled for 10/16/2021), HTN  PRECAUTIONS/RESTRICTIONS:    Shoulder: Active and passive ROM as tolerated to pain tolerance (Utilizing [place] [facility] Reverse Total Shoulder Arthoplasty Protocol), avoid shoulder extension past neutral and combined shoulder adduction/IR x10-12 weeks  SUBJECTIVE: Pt reports no shoulder pain today, although he reports his PMR is "acting up today." He reports increased wrist and back pain. He reports adherence to his HEP. Pt had a follow-up with his orthopedic surgeon yesterday, and he reports he got a  positive report.   PAIN:  Are you having pain? Yes: NPRS scale: 0/10 Pain location: Lt shoulder Pain description: Achy, sharp Aggravating factors: reaching overhead, shoulder rotation Relieving factors: ice, compression, pain medications  OBJECTIVE:  DIAGNOSTIC FINDINGS:  10/01/2021: XR Shoulder Left: AP axillary outlet radiographs left shoulder reviewed.  Reverse shoulder  prosthesis in good position alignment with no complicating features.   PATIENT SURVEYS:  FOTO 32%, predicted 61% in 21 visits 10/28/2021: FOTO: 47%   COGNITION:           Overall cognitive status: Within functional limits for tasks assessed                                  SENSATION: Not tested   POSTURE: Forward shoulders/ head   UPPER EXTREMITY ROM:    A/PROM Right eval Left eval Left 10/23/2021  Shoulder flexion   122p!/140 (springy end-feel) 150  Shoulder abduction   130p!/ 140 (springy end-feel) 170, p! At end range  Shoulder internal rotation   30/40p! 58  Shoulder external rotation   25/34p! 50  (Blank rows = not tested)   UPPER EXTREMITY MMT:   MMT Right eval Left eval  Shoulder flexion 5/5 3+/5p!  Shoulder extension 5/5 3/5p!  Shoulder abduction 5/5 3+/5p!  Shoulder internal rotation 5/5 3+/5p!  Shoulder external rotation 5/5 3+/5p!  Middle trapezius      Lower trapezius      Latissimus dorsi      Elbow flexion 5/5 4/5p!  Elbow extension 5/5 5/5  Grip strength (lbs) 101 98  (Blank rows = not tested)     PALPATION:  TTP along surgical incision on Lt shoulder             TODAY'S TREATMENT:  10/10/2021: Demonstrated and issued HEP      HOME EXERCISE PROGRAM: Access Code: [email or web address] URL: [email or web address] Date: 10/10/2021 Prepared by: Vanessa Tripp   Exercises - Shoulder Scaption AAROM with Dowel (Mirrored)  - 2 x daily - 7 x weekly - 3 sets - 10 reps - 5 seconds hold - Standing Shoulder External Rotation AAROM with Dowel  - 2 x daily - 7 x weekly - 3  sets - 10 reps - 5 seconds hold - Isometric shoulder flexion at wall with 50% force  - 1 x daily - 7 x weekly - 2 sets - 10 reps - 5 seconds hold - Isometric shoulder external rotation at wall with 50% force  - 1 x daily - 7 x weekly - 2 sets - 10 reps - 5 seconds hold - Isometric shoulder abduction at wall with 50% force  - 1 x daily - 7 x weekly - 2 sets - 10 reps - 5 seconds hold  [facility] Adult PT Treatment:                                                DATE: 10/30/2021 Therapeutic Exercise: Seated Lt  shoulder scaption AROM with cues for scapular depression and upward rotation rather than elevation 3x20 Seated alternating Lt shoulder ER/IR AROM 3x20 each Seated biceps curls with 5# dumbbells 3x12 Standing shoulder flexion AAROM with physioball at wall with gentle PT perturbations at end range 3x30 seconds Bent-over Lt shoulder pendulums 3x30sec Manual Therapy: Rt sidelying Lt shoulder protraction/ scaption and retraction/ adduction contract/ co-contract isometric MET x5 with 30-second hold at end range Sidelying STM to Lt parascapular musculature with arm in protraction/ scaption x5 minutes Neuromuscular re-ed: N/A Therapeutic Activity: N/A Modalities: N/A Self Care: N/A    [facility] Adult PT Treatment:                                                DATE: 10/28/2021 Therapeutic Exercise: Supine Lt shoulder scaption AAROM with dowel 2x10 with 5-sec hold Supine IR/ER AROM in scapular plane not surpassing 50 degrees in either direction 3x10 Standing shoulder flexion AAROM with physioball at wall with gentle PT perturbations at end range 4x30 seconds Bent-over Lt shoulder pendulums 3x30sec Seated biceps curls with 3# dumbbells 3x12 Manual Therapy: N/A Neuromuscular re-ed: N/A Therapeutic Activity: N/A Modalities: N/A Self Care: N/A   [facility] Adult PT Treatment:                                                DATE: 10/24/2021 Therapeutic Exercise: Standing shoulder flexion AAROM with  physioball at wall with gentle PT perturbations at end range 4x30 seconds Bent-over Lt shoulder pendulums 3x30sec Standing Lt shoulder scaption AAROM with UE Ranger to roughly 100 degrees of elevation 2x10 with 5-sec hold Standing Rt shoulder ER isometric hold in doorway 2x10 with 5-sec hold Standing Rt shoulder IR isometric hold in doorway 2x10 with 5-sec hold Standing Rt shoulder abduction isometric hold in doorway 2x10 with 5-sec hold Standing Rt shoulder flexion isometric hold in doorway x210 with 5-sec hold Manual Therapy: N/A Neuromuscular re-ed: N/A Therapeutic Activity: N/A Modalities: N/A Self Care: N/A     ASSESSMENT:   CLINICAL IMPRESSION: Pt presents 6 weeks 2 days s/p Lt reverse TSA. Pt responded excellently to seated AROM exercises and reports a therapeutic response to manual techniques today. He will continue to benefit from skilled PT to address his primary impairments and return to his prior level off unction with less limitation.     OBJECTIVE IMPAIRMENTS decreased mobility, decreased ROM, decreased strength, hypomobility, increased edema, impaired flexibility, impaired UE functional use, improper body mechanics, postural dysfunction, and pain.    ACTIVITY LIMITATIONS carrying, lifting, sleeping, bathing, dressing, reach over head, and hygiene/grooming   PARTICIPATION LIMITATIONS: meal prep, cleaning, laundry, driving, shopping, community activity, and yard work   PERSONAL FACTORS 1-2 comorbidities: See medical hx  are also affecting patient's functional outcome.      GOALS: Goals reviewed with patient? Yes   SHORT TERM GOALS: Target date: 11/07/2021    Pt will report understanding and adherence to initial HEP in order to promote independence in the management of primary impairments. Baseline: HEP provided at eval 10/23/2021: ACHIEVED Goal status: INITIAL   2.  Pt will achieve Lt shoulder elevation AROM of 150 degrees in order to progress post-op  protocol. Baseline: Flexion: 122d, abduction: 130d 10/23/2021: Flexion 150d, abduction: 170d  Goal status: ACHIEVED     LONG TERM GOALS: Target date: 12/05/2021   Pt will achieve a FOTO score of 61% in order to demonstrate improved functional ability as it relates to his primary impairments. Baseline: 32% 10/28/2021: 47% Goal status: IN PROGRESS   2.  Pt will achieve Lt shoulder elevation AROM of 165 degrees in order to reach into overhead cabinets with less limitation. Baseline: Flexion: 122d, abductio: 130d 10/23/2021: Flexion: 150d, abduction 170d Goal status: IN PROGRESS   3.  Pt will achieve Lt shoulder IR and ER AROM of 60 degrees in order to get dressed with less limitation. Baseline: ER: 25d, IR: 30d 10/23/2021: ER: 50d, IR: 58d Goal status: IN PROGRESS   4.  Pt will achieve global Lt shoulder MMT of 4+/5 or greater in order to progress his independent strengthening regimen with less limitation. Baseline: See MMT chart Goal status: INITIAL   5.  Pt will demonstrate ability to lift 10# overhead with Lt UE with 0-3/10 pain in order to put away storage items into his attic with less limitation. Baseline: Unable to lift with Lt UE due to post-op status Goal status: INITIAL       PLAN: PT FREQUENCY: 2x/week   PT DURATION: 8 weeks   PLANNED INTERVENTIONS: Therapeutic exercises, Therapeutic activity, Neuromuscular re-education, Patient/Family education, Self Care, Joint mobilization, Aquatic Therapy, Dry Needling, Electrical stimulation, Spinal manipulation, Spinal mobilization, Cryotherapy, Moist heat, scar mobilization, Taping, Vasopneumatic device, Biofeedback, Ionotophoresis 46m/ml Dexamethasone, Manual therapy, and Re-evaluation   PLAN FOR NEXT SESSION: Progress early shoulder ROM, strengthening [facility]   YVanessa Alhambra PT, DPT 10/30/21 10:42 AM

## 2021-11-04 ENCOUNTER — Ambulatory Visit: Payer: Medicare Other | Attending: Orthopedic Surgery

## 2021-11-04 DIAGNOSIS — R600 Localized edema: Secondary | ICD-10-CM | POA: Insufficient documentation

## 2021-11-04 DIAGNOSIS — M6281 Muscle weakness (generalized): Secondary | ICD-10-CM | POA: Diagnosis not present

## 2021-11-04 DIAGNOSIS — G8929 Other chronic pain: Secondary | ICD-10-CM | POA: Insufficient documentation

## 2021-11-04 DIAGNOSIS — M25612 Stiffness of left shoulder, not elsewhere classified: Secondary | ICD-10-CM | POA: Diagnosis not present

## 2021-11-04 DIAGNOSIS — M25512 Pain in left shoulder: Secondary | ICD-10-CM | POA: Insufficient documentation

## 2021-11-04 DIAGNOSIS — H6121 Impacted cerumen, right ear: Secondary | ICD-10-CM | POA: Diagnosis not present

## 2021-11-04 DIAGNOSIS — H903 Sensorineural hearing loss, bilateral: Secondary | ICD-10-CM | POA: Diagnosis not present

## 2021-11-04 DIAGNOSIS — H9313 Tinnitus, bilateral: Secondary | ICD-10-CM | POA: Diagnosis not present

## 2021-11-04 NOTE — Therapy (Signed)
OUTPATIENT PHYSICAL THERAPY TREATMENT NOTE   Patient Name: Victor Ramirez MRN: [id number] DOB:05-25-1955, 66 y.o., male Today's Date: 11/04/2021  PCP: Lois Huxley, [place]   REFERRING PROVIDER: Meredith Pel, MD   PT End of Session - 11/04/21 1135     Visit Number 8    Number of Visits 17    Date for PT Re-Evaluation 12/12/21    Authorization Type BCBS    Authorization Time Period FOTO v6, v10    Progress Note Due on Visit 10    PT Start Time 1135    PT Stop Time 1213    PT Time Calculation (min) 38 min    Activity Tolerance Patient tolerated treatment well    Behavior During Therapy WFL for tasks assessed/performed                  Past Medical History:  Diagnosis Date   Arthritis    Hemochromatosis    Hypertension    Past Surgical History:  Procedure Laterality Date   ANTERIOR LATERAL LUMBAR FUSION WITH PERCUTANEOUS SCREW 1 LEVEL Right 11/27/2016   Procedure: Lumbar Three-Four Transpsoas Lumbar InterbodyFfusion;  Surgeon: Ditty, Kevan Ny, MD;  Location: [place];  Service: Neurosurgery;  Laterality: Right;  L3-4 Transpsoas lumbar interbody fusion/L3-4 Pedicle screw fixation with posterolateral arthrodesis/minimally invasive decompression/Mazor   APPLICATION OF ROBOTIC ASSISTANCE FOR SPINAL PROCEDURE N/A 11/27/2016   Procedure: Lumbar Three-Four Pedicle Screw Fixation with Posterolateral Arthrodesis with Minimally Invasive Decompression with Application of Robotic Assistance;  Surgeon: Ditty, Kevan Ny, MD;  Location: [place];  Service: Neurosurgery;  Laterality: N/A;   BACK SURGERY     x3    BACK SURGERY     battery surgery    REVERSE SHOULDER ARTHROPLASTY Left 09/16/2021   Procedure: LEFT REVERSE SHOULDER ARTHROPLASTY;  Surgeon: Meredith Pel, MD;  Location: [place];  Service: Orthopedics;  Laterality: Left;   spinal manipulation under anesthesia     TONSILLECTOMY     Patient Active Problem List   Diagnosis Date Noted   Vitamin D deficiency  10/15/2021   Osteoporosis 10/15/2021   OA (osteoarthritis) of shoulder 09/16/2021   S/P reverse total shoulder arthroplasty, left 09/16/2021   Pain in right wrist 05/12/2021   RUQ abdominal pain    Acute pancreatitis without infection or necrosis 12/02/2020   Pain in left shoulder 11/28/2020   Rheumatoid factor positive 08/29/2020   Polymyalgia rheumatica ([place]) 08/08/2020   Polyarthralgia 08/08/2020   High risk medication use 08/08/2020   Lumbosacral spondylosis with radiculopathy 11/27/2016   Atrophy of muscle of right lower leg 02/04/2014   History of post-polio syndrome 02/02/2014   Pain of left lower extremity 02/02/2014   Chronic back pain 05/14/2013   HTN (hypertension) 05/14/2013   Headache 12/22/2011   Sleep apnea 11/17/2011   Elevation of level of transaminase or lactic acid dehydrogenase (LDH) 09/18/2010   Anxiety 09/08/2010   Tachycardia 09/08/2010   Hyperlipemia 03/01/2006   TIA (transient ischemic attack) 03/01/2006   Tobacco use disorder 03/01/2006   Depressive disorder 10/16/2005    THERAPY DIAG:  Chronic left shoulder pain  Decreased ROM of left shoulder  Muscle weakness (generalized)  Localized edema  REFERRING DIAG: W09.811 (ICD-10-CM) - History of arthroplasty of left shoulder  PERTINENT HISTORY: Left reverse shoulder arthroplasty with Dr. Marcene Duos on 09/16/2021, L1 fx from 09/05/2021 (first orthopedic appoint scheduled for 10/16/2021), HTN  PRECAUTIONS/RESTRICTIONS:    Shoulder: Active and passive ROM as tolerated to pain tolerance (Utilizing [place] and  [facility] Reverse Total Shoulder Arthoplasty Protocol), avoid shoulder extension past neutral and combined shoulder adduction/IR x10-12 weeks  SUBJECTIVE: Pt presents 7 weeks s/p Lt reverse shoulder arthroplasty with Dr. Marcene Duos on 09/16/2021.  Pt denies any pain today.   PAIN:  Are you having pain? Yes: NPRS scale: 0/10 Pain location: Lt shoulder Pain description: Achy,  sharp Aggravating factors: reaching overhead, shoulder rotation Relieving factors: ice, compression, pain medications  OBJECTIVE:  DIAGNOSTIC FINDINGS:  10/01/2021: XR Shoulder Left: AP axillary outlet radiographs left shoulder reviewed.  Reverse shoulder  prosthesis in good position alignment with no complicating features.   PATIENT SURVEYS:  FOTO 32%, predicted 61% in 21 visits 10/28/2021: FOTO: 47%   COGNITION:           Overall cognitive status: Within functional limits for tasks assessed                                  SENSATION: Not tested   POSTURE: Forward shoulders/ head   UPPER EXTREMITY ROM:    A/PROM Right eval Left eval Left 10/23/2021  Shoulder flexion   122p!/140 (springy end-feel) 150  Shoulder abduction   130p!/ 140 (springy end-feel) 170, p! At end range  Shoulder internal rotation   30/40p! 58  Shoulder external rotation   25/34p! 50  (Blank rows = not tested)   UPPER EXTREMITY MMT:   MMT Right eval Left eval  Shoulder flexion 5/5 3+/5p!  Shoulder extension 5/5 3/5p!  Shoulder abduction 5/5 3+/5p!  Shoulder internal rotation 5/5 3+/5p!  Shoulder external rotation 5/5 3+/5p!  Middle trapezius      Lower trapezius      Latissimus dorsi      Elbow flexion 5/5 4/5p!  Elbow extension 5/5 5/5  Grip strength (lbs) 101 98  (Blank rows = not tested)     PALPATION:  TTP along surgical incision on Lt shoulder             TODAY'S TREATMENT:  10/10/2021: Demonstrated and issued HEP      HOME EXERCISE PROGRAM: Access Code: [email or web address] URL: [email or web address] Date: 10/10/2021 Prepared by: Vanessa [place]   Exercises - Shoulder Scaption AAROM with Dowel (Mirrored)  - 2 x daily - 7 x weekly - 3 sets - 10 reps - 5 seconds hold - Standing Shoulder External Rotation AAROM with Dowel  - 2 x daily - 7 x weekly - 3 sets - 10 reps - 5 seconds hold - Isometric shoulder flexion at wall with 50% force  - 1 x daily - 7 x weekly - 2 sets - 10  reps - 5 seconds hold - Isometric shoulder external rotation at wall with 50% force  - 1 x daily - 7 x weekly - 2 sets - 10 reps - 5 seconds hold - Isometric shoulder abduction at wall with 50% force  - 1 x daily - 7 x weekly - 2 sets - 10 reps - 5 seconds hold  [facility] Adult PT Treatment:                                                DATE: 11/04/2021 Therapeutic Exercise: Seated BIL shoulder scaption AROM with cues to avoid scapular elevation 3x15 Sidelying Lt shoulder ER/IR AROM with cues to not exceed 50 degrees of  either motion 3x15 Seated Lt shoulder scaption AAROM with dowel 2x15 with 5-sec hold Seated Lt shoulder ERAAROM with dowel with cues to not exceed 50 degrees 2x15 with 5-sec hold Seated BIL dumbbell biceps curls with slow eccentric phase 3x10 Standing shoulder flexion AAROM with physioball at wall with gentle PT perturbations at end range 3x30 seconds Bent-over Lt shoulder pendulums 3x30sec Manual Therapy: N/A Neuromuscular re-ed: N/A Therapeutic Activity: N/A Modalities: N/A Self Care: N/A   [facility] Adult PT Treatment:                                                DATE: 10/30/2021 Therapeutic Exercise: Seated Lt shoulder scaption AROM with cues for scapular depression and upward rotation rather than elevation 3x20 Seated alternating Lt shoulder ER/IR AROM 3x20 each Seated biceps curls with 5# dumbbells 3x12 Standing shoulder flexion AAROM with physioball at wall with gentle PT perturbations at end range 3x30 seconds Bent-over Lt shoulder pendulums 3x30sec Manual Therapy: Rt sidelying Lt shoulder protraction/ scaption and retraction/ adduction contract/ co-contract isometric MET x5 with 30-second hold at end range Sidelying STM to Lt parascapular musculature with arm in protraction/ scaption x5 minutes Neuromuscular re-ed: N/A Therapeutic Activity: N/A Modalities: N/A Self Care: N/A    [facility] Adult PT Treatment:                                                DATE:  10/28/2021 Therapeutic Exercise: Supine Lt shoulder scaption AAROM with dowel 2x10 with 5-sec hold Supine IR/ER AROM in scapular plane not surpassing 50 degrees in either direction 3x10 Standing shoulder flexion AAROM with physioball at wall with gentle PT perturbations at end range 4x30 seconds Bent-over Lt shoulder pendulums 3x30sec Seated biceps curls with 3# dumbbells 3x12 Manual Therapy: N/A Neuromuscular re-ed: N/A Therapeutic Activity: N/A Modalities: N/A Self Care: N/A   ASSESSMENT:   CLINICAL IMPRESSION: Pt presents 7 weeks s/p Lt reverse shoulder arthroplasty with Dr. Marcene Duos on 09/16/2021. Pt responded well to all interventions today while maintaining post-op protocol. He will continue to benefit from skilled PT to address his primary impairments and return to his prior level off unction with less limitation.     OBJECTIVE IMPAIRMENTS decreased mobility, decreased ROM, decreased strength, hypomobility, increased edema, impaired flexibility, impaired UE functional use, improper body mechanics, postural dysfunction, and pain.    ACTIVITY LIMITATIONS carrying, lifting, sleeping, bathing, dressing, reach over head, and hygiene/grooming   PARTICIPATION LIMITATIONS: meal prep, cleaning, laundry, driving, shopping, community activity, and yard work   PERSONAL FACTORS 1-2 comorbidities: See medical hx  are also affecting patient's functional outcome.      GOALS: Goals reviewed with patient? Yes   SHORT TERM GOALS: Target date: 11/07/2021    Pt will report understanding and adherence to initial HEP in order to promote independence in the management of primary impairments. Baseline: HEP provided at eval 10/23/2021: ACHIEVED Goal status: INITIAL   2.  Pt will achieve Lt shoulder elevation AROM of 150 degrees in order to progress post-op protocol. Baseline: Flexion: 122d, abduction: 130d 10/23/2021: Flexion 150d, abduction: 170d Goal status: ACHIEVED     LONG TERM  GOALS: Target date: 12/05/2021   Pt will achieve a FOTO score of 61% in order  to demonstrate improved functional ability as it relates to his primary impairments. Baseline: 32% 10/28/2021: 47% Goal status: IN PROGRESS   2.  Pt will achieve Lt shoulder elevation AROM of 165 degrees in order to reach into overhead cabinets with less limitation. Baseline: Flexion: 122d, abductio: 130d 10/23/2021: Flexion: 150d, abduction 170d Goal status: IN PROGRESS   3.  Pt will achieve Lt shoulder IR and ER AROM of 60 degrees in order to get dressed with less limitation. Baseline: ER: 25d, IR: 30d 10/23/2021: ER: 50d, IR: 58d Goal status: IN PROGRESS   4.  Pt will achieve global Lt shoulder MMT of 4+/5 or greater in order to progress his independent strengthening regimen with less limitation. Baseline: See MMT chart Goal status: INITIAL   5.  Pt will demonstrate ability to lift 10# overhead with Lt UE with 0-3/10 pain in order to put away storage items into his attic with less limitation. Baseline: Unable to lift with Lt UE due to post-op status Goal status: INITIAL       PLAN: PT FREQUENCY: 2x/week   PT DURATION: 8 weeks   PLANNED INTERVENTIONS: Therapeutic exercises, Therapeutic activity, Neuromuscular re-education, Patient/Family education, Self Care, Joint mobilization, Aquatic Therapy, Dry Needling, Electrical stimulation, Spinal manipulation, Spinal mobilization, Cryotherapy, Moist heat, scar mobilization, Taping, Vasopneumatic device, Biofeedback, Ionotophoresis 88m/ml Dexamethasone, Manual therapy, and Re-evaluation   PLAN FOR NEXT SESSION: Progress early shoulder ROM, strengthening in accordance with [facility]   YVanessa [place] PT, DPT 11/04/21 12:16 PM

## 2021-11-06 ENCOUNTER — Ambulatory Visit: Payer: Medicare Other

## 2021-11-06 DIAGNOSIS — R600 Localized edema: Secondary | ICD-10-CM

## 2021-11-06 DIAGNOSIS — M6281 Muscle weakness (generalized): Secondary | ICD-10-CM | POA: Diagnosis not present

## 2021-11-06 DIAGNOSIS — M25512 Pain in left shoulder: Secondary | ICD-10-CM | POA: Diagnosis not present

## 2021-11-06 DIAGNOSIS — G8929 Other chronic pain: Secondary | ICD-10-CM | POA: Diagnosis not present

## 2021-11-06 DIAGNOSIS — M25612 Stiffness of left shoulder, not elsewhere classified: Secondary | ICD-10-CM

## 2021-11-06 NOTE — Therapy (Signed)
OUTPATIENT PHYSICAL THERAPY TREATMENT NOTE   Patient Name: Victor Ramirez MRN: [id number] DOB:06/25/1955, 66 y.o., male Today's Date: 11/06/2021  PCP: Lois Huxley, [place]   REFERRING PROVIDER: Meredith Pel, MD   PT End of Session - 11/06/21 1005     Visit Number 9    Number of Visits 17    Date for PT Re-Evaluation 12/12/21    Authorization Type BCBS    Authorization Time Period FOTO v6, v10    Progress Note Due on Visit 10    PT Start Time 1005    PT Stop Time 1043    PT Time Calculation (min) 38 min    Activity Tolerance Patient tolerated treatment well    Behavior During Therapy WFL for tasks assessed/performed                   Past Medical History:  Diagnosis Date   Arthritis    Hemochromatosis    Hypertension    Past Surgical History:  Procedure Laterality Date   ANTERIOR LATERAL LUMBAR FUSION WITH PERCUTANEOUS SCREW 1 LEVEL Right 11/27/2016   Procedure: Lumbar Three-Four Transpsoas Lumbar InterbodyFfusion;  Surgeon: Ditty, Kevan Ny, MD;  Location: [place];  Service: Neurosurgery;  Laterality: Right;  L3-4 Transpsoas lumbar interbody fusion/L3-4 Pedicle screw fixation with posterolateral arthrodesis/minimally invasive decompression/Mazor   APPLICATION OF ROBOTIC ASSISTANCE FOR SPINAL PROCEDURE N/A 11/27/2016   Procedure: Lumbar Three-Four Pedicle Screw Fixation with Posterolateral Arthrodesis with Minimally Invasive Decompression with Application of Robotic Assistance;  Surgeon: Ditty, Kevan Ny, MD;  Location: [place];  Service: Neurosurgery;  Laterality: N/A;   BACK SURGERY     x3    BACK SURGERY     battery surgery    REVERSE SHOULDER ARTHROPLASTY Left 09/16/2021   Procedure: LEFT REVERSE SHOULDER ARTHROPLASTY;  Surgeon: Meredith Pel, MD;  Location: [place];  Service: Orthopedics;  Laterality: Left;   spinal manipulation under anesthesia     TONSILLECTOMY     Patient Active Problem List   Diagnosis Date Noted   Vitamin D  deficiency 10/15/2021   Osteoporosis 10/15/2021   OA (osteoarthritis) of shoulder 09/16/2021   S/P reverse total shoulder arthroplasty, left 09/16/2021   Pain in right wrist 05/12/2021   RUQ abdominal pain    Acute pancreatitis without infection or necrosis 12/02/2020   Pain in left shoulder 11/28/2020   Rheumatoid factor positive 08/29/2020   Polymyalgia rheumatica ([place]) 08/08/2020   Polyarthralgia 08/08/2020   High risk medication use 08/08/2020   Lumbosacral spondylosis with radiculopathy 11/27/2016   Atrophy of muscle of right lower leg 02/04/2014   History of post-polio syndrome 02/02/2014   Pain of left lower extremity 02/02/2014   Chronic back pain 05/14/2013   HTN (hypertension) 05/14/2013   Headache 12/22/2011   Sleep apnea 11/17/2011   Elevation of level of transaminase or lactic acid dehydrogenase (LDH) 09/18/2010   Anxiety 09/08/2010   Tachycardia 09/08/2010   Hyperlipemia 03/01/2006   TIA (transient ischemic attack) 03/01/2006   Tobacco use disorder 03/01/2006   Depressive disorder 10/16/2005    THERAPY DIAG:  Chronic left shoulder pain  Decreased ROM of left shoulder  Muscle weakness (generalized)  Localized edema  REFERRING DIAG: Q59.563 (ICD-10-CM) - History of arthroplasty of left shoulder  PERTINENT HISTORY: Left reverse shoulder arthroplasty with Dr. Marcene Duos on 09/16/2021, L1 fx from 09/05/2021 (first orthopedic appoint scheduled for 10/16/2021), HTN  PRECAUTIONS/RESTRICTIONS:    Shoulder: Active and passive ROM as tolerated to pain tolerance (Utilizing [employer]  and [facility] Reverse Total Shoulder Arthoplasty Protocol), avoid shoulder extension past neutral and combined shoulder adduction/IR x10-12 weeks  SUBJECTIVE: Pt reports no pain currently, only reporting some discomfort this morning from sleeping on his side. He reports adherence to his HEP.  PAIN:  Are you having pain? Yes: NPRS scale: 0/10 Pain location: Lt shoulder Pain  description: Achy, sharp Aggravating factors: reaching overhead, shoulder rotation Relieving factors: ice, compression, pain medications  OBJECTIVE:  DIAGNOSTIC FINDINGS:  10/01/2021: XR Shoulder Left: AP axillary outlet radiographs left shoulder reviewed.  Reverse shoulder  prosthesis in good position alignment with no complicating features.   PATIENT SURVEYS:  FOTO 32%, predicted 61% in 21 visits 10/28/2021: FOTO: 47%   COGNITION:           Overall cognitive status: Within functional limits for tasks assessed                                  SENSATION: Not tested   POSTURE: Forward shoulders/ head   UPPER EXTREMITY ROM:    A/PROM Right eval Left eval Left 10/23/2021  Shoulder flexion   122p!/140 (springy end-feel) 150  Shoulder abduction   130p!/ 140 (springy end-feel) 170, p! At end range  Shoulder internal rotation   30/40p! 58  Shoulder external rotation   25/34p! 50  (Blank rows = not tested)   UPPER EXTREMITY MMT:   MMT Right eval Left eval  Shoulder flexion 5/5 3+/5p!  Shoulder extension 5/5 3/5p!  Shoulder abduction 5/5 3+/5p!  Shoulder internal rotation 5/5 3+/5p!  Shoulder external rotation 5/5 3+/5p!  Middle trapezius      Lower trapezius      Latissimus dorsi      Elbow flexion 5/5 4/5p!  Elbow extension 5/5 5/5  Grip strength (lbs) 101 98  (Blank rows = not tested)     PALPATION:  TTP along surgical incision on Lt shoulder     HOME EXERCISE PROGRAM: Access Code: [email or web address] URL: [email or web address] Date: 10/10/2021 Prepared by: Vanessa [place]   Exercises - Shoulder Scaption AAROM with Dowel (Mirrored)  - 2 x daily - 7 x weekly - 3 sets - 10 reps - 5 seconds hold - Standing Shoulder External Rotation AAROM with Dowel  - 2 x daily - 7 x weekly - 3 sets - 10 reps - 5 seconds hold - Isometric shoulder flexion at wall with 50% force  - 1 x daily - 7 x weekly - 2 sets - 10 reps - 5 seconds hold - Isometric shoulder external  rotation at wall with 50% force  - 1 x daily - 7 x weekly - 2 sets - 10 reps - 5 seconds hold - Isometric shoulder abduction at wall with 50% force  - 1 x daily - 7 x weekly - 2 sets - 10 reps - 5 seconds hold   [facility] Adult PT Treatment:                                                DATE: 11/06/2021 Therapeutic Exercise: Seated Lt shoulder scaption AAROM with dowel 3x15 with 5-sec hold Seated Lt shoulder ER AAROM with dowel with cues to not exceed 50 degrees 3x15 with 5-sec hold Seated BIL shoulder scaption AROM with cues to avoid scapular elevation and tactile cuing for  scapular upward rotation 2x15 Sidelying Lt shoulder ER/IR AROM with 1# dumbbell with cues to not exceed 50 degrees of either motion 3x15 Seated BIL dumbbell biceps curls with 7# dumbbells with slow eccentric phase 3x10 Standing shoulder flexion AAROM with physioball at wall with gentle PT perturbations at end range 3x30 seconds Bent-over Lt shoulder pendulums 3x30sec Manual Therapy: N/A Neuromuscular re-ed: N/A Therapeutic Activity: N/A Modalities: N/A Self Care: N/A   [facility] Adult PT Treatment:                                                DATE: 11/04/2021 Therapeutic Exercise: Seated BIL shoulder scaption AROM with cues to avoid scapular elevation 3x15 Sidelying Lt shoulder ER/IR AROM with cues to not exceed 50 degrees of either motion 3x15 Seated Lt shoulder scaption AAROM with dowel 2x15 with 5-sec hold Seated Lt shoulder ER AAROM with dowel with cues to not exceed 50 degrees 2x15 with 5-sec hold Seated BIL dumbbell biceps curls with slow eccentric phase 3x10 Standing shoulder flexion AAROM with physioball at wall with gentle PT perturbations at end range 2x30 seconds Bent-over Lt shoulder pendulums 2x30sec Manual Therapy: N/A Neuromuscular re-ed: N/A Therapeutic Activity: N/A Modalities: N/A Self Care: N/A   [facility] Adult PT Treatment:                                                DATE:  10/30/2021 Therapeutic Exercise: Seated Lt shoulder scaption AROM with cues for scapular depression and upward rotation rather than elevation 3x20 Seated alternating Lt shoulder ER/IR AROM 3x20 each Seated biceps curls with 5# dumbbells 3x12 Standing shoulder flexion AAROM with physioball at wall with gentle PT perturbations at end range 3x30 seconds Bent-over Lt shoulder pendulums 3x30sec Manual Therapy: Rt sidelying Lt shoulder protraction/ scaption and retraction/ adduction contract/ co-contract isometric MET x5 with 30-second hold at end range Sidelying STM to Lt parascapular musculature with arm in protraction/ scaption x5 minutes Neuromuscular re-ed: N/A Therapeutic Activity: N/A Modalities: N/A Self Care: N/A     ASSESSMENT:   CLINICAL IMPRESSION: Pt presents 7 weeks and 2 days s/p Lt reverse shoulder arthroplasty with Dr. Marcene Duos on 09/16/2021. Pt continues to progress well with increased load and range while maintaining post-op protocol. He will continue to benefit from skilled PT to address his primary impairments and return to his prior level of function with less limitation.      OBJECTIVE IMPAIRMENTS decreased mobility, decreased ROM, decreased strength, hypomobility, increased edema, impaired flexibility, impaired UE functional use, improper body mechanics, postural dysfunction, and pain.    ACTIVITY LIMITATIONS carrying, lifting, sleeping, bathing, dressing, reach over head, and hygiene/grooming   PARTICIPATION LIMITATIONS: meal prep, cleaning, laundry, driving, shopping, community activity, and yard work   PERSONAL FACTORS 1-2 comorbidities: See medical hx  are also affecting patient's functional outcome.      GOALS: Goals reviewed with patient? Yes   SHORT TERM GOALS: Target date: 11/07/2021    Pt will report understanding and adherence to initial HEP in order to promote independence in the management of primary impairments. Baseline: HEP provided at  eval 10/23/2021: ACHIEVED Goal status: INITIAL   2.  Pt will achieve Lt shoulder elevation AROM of 150 degrees in order to  progress post-op protocol. Baseline: Flexion: 122d, abduction: 130d 10/23/2021: Flexion 150d, abduction: 170d Goal status: ACHIEVED     LONG TERM GOALS: Target date: 12/05/2021   Pt will achieve a FOTO score of 61% in order to demonstrate improved functional ability as it relates to his primary impairments. Baseline: 32% 10/28/2021: 47% Goal status: IN PROGRESS   2.  Pt will achieve Lt shoulder elevation AROM of 165 degrees in order to reach into overhead cabinets with less limitation. Baseline: Flexion: 122d, abductio: 130d 10/23/2021: Flexion: 150d, abduction 170d Goal status: IN PROGRESS   3.  Pt will achieve Lt shoulder IR and ER AROM of 60 degrees in order to get dressed with less limitation. Baseline: ER: 25d, IR: 30d 10/23/2021: ER: 50d, IR: 58d Goal status: IN PROGRESS   4.  Pt will achieve global Lt shoulder MMT of 4+/5 or greater in order to progress his independent strengthening regimen with less limitation. Baseline: See MMT chart Goal status: INITIAL   5.  Pt will demonstrate ability to lift 10# overhead with Lt UE with 0-3/10 pain in order to put away storage items into his attic with less limitation. Baseline: Unable to lift with Lt UE due to post-op status Goal status: INITIAL       PLAN: PT FREQUENCY: 2x/week   PT DURATION: 8 weeks   PLANNED INTERVENTIONS: Therapeutic exercises, Therapeutic activity, Neuromuscular re-education, Patient/Family education, Self Care, Joint mobilization, Aquatic Therapy, Dry Needling, Electrical stimulation, Spinal manipulation, Spinal mobilization, Cryotherapy, Moist heat, scar mobilization, Taping, Vasopneumatic device, Biofeedback, Ionotophoresis 33m/ml Dexamethasone, Manual therapy, and Re-evaluation   PLAN FOR NEXT SESSION: Progress early shoulder ROM, strengthening in accordance with [facility]   YVanessa [place] PT, DPT 11/06/21 10:45 AM

## 2021-11-10 ENCOUNTER — Other Ambulatory Visit: Payer: Self-pay | Admitting: Neurosurgery

## 2021-11-10 DIAGNOSIS — S32010A Wedge compression fracture of first lumbar vertebra, initial encounter for closed fracture: Secondary | ICD-10-CM

## 2021-11-11 ENCOUNTER — Ambulatory Visit: Payer: Medicare Other

## 2021-11-11 DIAGNOSIS — G8929 Other chronic pain: Secondary | ICD-10-CM

## 2021-11-11 DIAGNOSIS — M6281 Muscle weakness (generalized): Secondary | ICD-10-CM | POA: Diagnosis not present

## 2021-11-11 DIAGNOSIS — R600 Localized edema: Secondary | ICD-10-CM | POA: Diagnosis not present

## 2021-11-11 DIAGNOSIS — M25612 Stiffness of left shoulder, not elsewhere classified: Secondary | ICD-10-CM | POA: Diagnosis not present

## 2021-11-11 DIAGNOSIS — M25512 Pain in left shoulder: Secondary | ICD-10-CM | POA: Diagnosis not present

## 2021-11-11 NOTE — Therapy (Signed)
OUTPATIENT PHYSICAL THERAPY TREATMENT NOTE/ PROGRESS NOTE  Progress Note Reporting Period 10/10/2021 to 11/11/2021  See note below for Objective Data and Assessment of Progress/Goals.      Patient Name: Victor Ramirez MRN: [id number] DOB:1955-10-26, 66 y.o., male Today's Date: 11/11/2021  PCP: Wilfrid Lund, PA   REFERRING PROVIDER: Cammy Copa, MD   PT End of Session - 11/11/21 1007     Visit Number 10    Number of Visits 17    Date for PT Re-Evaluation 12/12/21    Authorization Type BCBS    Authorization Time Period FOTO v6, v10    Progress Note Due on Visit 10    PT Start Time 1007    PT Stop Time 1045    PT Time Calculation (min) 38 min    Activity Tolerance Patient tolerated treatment well    Behavior During Therapy WFL for tasks assessed/performed                    Past Medical History:  Diagnosis Date   Arthritis    Hemochromatosis    Hypertension    Past Surgical History:  Procedure Laterality Date   ANTERIOR LATERAL LUMBAR FUSION WITH PERCUTANEOUS SCREW 1 LEVEL Right 11/27/2016   Procedure: Lumbar Three-Four Transpsoas Lumbar InterbodyFfusion;  Surgeon: Ditty, Loura Halt, MD;  Location: [facility] OR;  Service: Neurosurgery;  Laterality: Right;  L3-4 Transpsoas lumbar interbody fusion/L3-4 Pedicle screw fixation with posterolateral arthrodesis/minimally invasive decompression/Mazor   APPLICATION OF ROBOTIC ASSISTANCE FOR SPINAL PROCEDURE N/A 11/27/2016   Procedure: Lumbar Three-Four Pedicle Screw Fixation with Posterolateral Arthrodesis with Minimally Invasive Decompression with Application of Robotic Assistance;  Surgeon: Ditty, Loura Halt, MD;  Location: [facility] OR;  Service: Neurosurgery;  Laterality: N/A;   BACK SURGERY     x3    BACK SURGERY     battery surgery    REVERSE SHOULDER ARTHROPLASTY Left 09/16/2021   Procedure: LEFT REVERSE SHOULDER ARTHROPLASTY;  Surgeon: Cammy Copa, MD;  Location: [facility] OR;  Service: Orthopedics;   Laterality: Left;   spinal manipulation under anesthesia     TONSILLECTOMY     Patient Active Problem List   Diagnosis Date Noted   Vitamin D deficiency 10/15/2021   Osteoporosis 10/15/2021   OA (osteoarthritis) of shoulder 09/16/2021   S/P reverse total shoulder arthroplasty, left 09/16/2021   Pain in right wrist 05/12/2021   RUQ abdominal pain    Acute pancreatitis without infection or necrosis 12/02/2020   Pain in left shoulder 11/28/2020   Rheumatoid factor positive 08/29/2020   Polymyalgia rheumatica (HCC) 08/08/2020   Polyarthralgia 08/08/2020   High risk medication use 08/08/2020   Lumbosacral spondylosis with radiculopathy 11/27/2016   Atrophy of muscle of right lower leg 02/04/2014   History of post-polio syndrome 02/02/2014   Pain of left lower extremity 02/02/2014   Chronic back pain 05/14/2013   HTN (hypertension) 05/14/2013   Headache 12/22/2011   Sleep apnea 11/17/2011   Elevation of level of transaminase or lactic acid dehydrogenase (LDH) 09/18/2010   Anxiety 09/08/2010   Tachycardia 09/08/2010   Hyperlipemia 03/01/2006   TIA (transient ischemic attack) 03/01/2006   Tobacco use disorder 03/01/2006   Depressive disorder 10/16/2005    THERAPY DIAG:  Chronic left shoulder pain  Decreased ROM of left shoulder  Muscle weakness (generalized)  Localized edema  REFERRING DIAG: O67.124 (ICD-10-CM) - History of arthroplasty of left shoulder  PERTINENT HISTORY: Left reverse shoulder arthroplasty with Dr. Rise Paganini on 09/16/2021, L1 fx from  09/05/2021 (first orthopedic appoint scheduled for 10/16/2021), HTN  PRECAUTIONS/RESTRICTIONS:    Shoulder: Active and passive ROM as tolerated to pain tolerance (Utilizing [facility] and [facility] Reverse Total Shoulder Arthoplasty Protocol), avoid shoulder extension past neutral and combined shoulder adduction/IR x10-12 weeks  SUBJECTIVE: Pt reports minor (1/10) pain today. He reports continued adherence to his  HEP.  PAIN:  Are you having pain? Yes: NPRS scale: 1/10 Pain location: Lt shoulder Pain description: Achy, sharp Aggravating factors: reaching overhead, shoulder rotation Relieving factors: ice, compression, pain medications  OBJECTIVE:  DIAGNOSTIC FINDINGS:  10/01/2021: XR Shoulder Left: AP axillary outlet radiographs left shoulder reviewed.  Reverse shoulder  prosthesis in good position alignment with no complicating features.   PATIENT SURVEYS:  FOTO 32%, predicted 61% in 21 visits 10/28/2021: FOTO: 47% 11/11/2021: 53%   COGNITION:           Overall cognitive status: Within functional limits for tasks assessed                                  SENSATION: Not tested   POSTURE: Forward shoulders/ head   UPPER EXTREMITY ROM:    A/PROM Right eval Left eval Left 10/23/2021  Shoulder flexion   122p!/140 (springy end-feel) 150  Shoulder abduction   130p!/ 140 (springy end-feel) 170, p! At end range  Shoulder internal rotation   30/40p! 58  Shoulder external rotation   25/34p! 50  (Blank rows = not tested)   UPPER EXTREMITY MMT:   MMT Right eval Left eval Left 11/11/2021  Shoulder flexion 5/5 3+/5p! 4+/5, minor pain  Shoulder extension 5/5 3/5p! 4+/5  Shoulder abduction 5/5 3+/5p! 4+/5, minor pain  Shoulder internal rotation 5/5 3+/5p! 4/5, minor pain  Shoulder external rotation 5/5 3+/5p! 4+/5  Middle trapezius       Lower trapezius       Latissimus dorsi       Elbow flexion 5/5 4/5p! 4+/5  Elbow extension 5/5 5/5   Grip strength (lbs) 101 98   (Blank rows = not tested)     PALPATION:  TTP along surgical incision on Lt shoulder  FUNCTIONAL TESTS:  11/11/2021: 5# dumbbell overhead lift x1 with minor discomfort     HOME EXERCISE PROGRAM: Access Code: [email or web address] URL: [email or web address] Date: 10/10/2021 Prepared by: Carmelina Dane   Exercises - Shoulder Scaption AAROM with Dowel (Mirrored)  - 2 x daily - 7 x weekly - 3 sets - 10 reps - 5  seconds hold - Standing Shoulder External Rotation AAROM with Dowel  - 2 x daily - 7 x weekly - 3 sets - 10 reps - 5 seconds hold - Isometric shoulder flexion at wall with 50% force  - 1 x daily - 7 x weekly - 2 sets - 10 reps - 5 seconds hold - Isometric shoulder external rotation at wall with 50% force  - 1 x daily - 7 x weekly - 2 sets - 10 reps - 5 seconds hold - Isometric shoulder abduction at wall with 50% force  - 1 x daily - 7 x weekly - 2 sets - 10 reps - 5 seconds hold   [facility] Adult PT Treatment:  DATE: 11/11/2021 Therapeutic Exercise: Supine Lt forward shoulder flexion with 2# dumbbell 3x10 Rt sidelying Lt shoulder ER with 2# dumbbell 3x10 Seated Lt shoulder scaption AAROM with dowel 2x10 with 5-sec hold Seated Lt shoulder scaption AROM with cues to avoid shrug 3x10 Manual Therapy: N/A Neuromuscular re-ed: N/A Therapeutic Activity: Re-assessment of objective measures with pt education Re-administration of FOTO with pt education Modalities: N/A Self Care: N/A   [facility] Adult PT Treatment:                                                DATE: 11/06/2021 Therapeutic Exercise: Seated Lt shoulder scaption AAROM with dowel 3x15 with 5-sec hold Seated Lt shoulder ER AAROM with dowel with cues to not exceed 50 degrees 3x15 with 5-sec hold Seated BIL shoulder scaption AROM with cues to avoid scapular elevation and tactile cuing for scapular upward rotation 2x15 Sidelying Lt shoulder ER/IR AROM with 1# dumbbell with cues to not exceed 50 degrees of either motion 3x15 Seated BIL dumbbell biceps curls with 7# dumbbells with slow eccentric phase 3x10 Standing shoulder flexion AAROM with physioball at wall with gentle PT perturbations at end range 3x30 seconds Bent-over Lt shoulder pendulums 3x30sec Manual Therapy: N/A Neuromuscular re-ed: N/A Therapeutic Activity: N/A Modalities: N/A Self Care: N/A   [facility] Adult PT Treatment:                                                 DATE: 11/04/2021 Therapeutic Exercise: Seated BIL shoulder scaption AROM with cues to avoid scapular elevation 3x15 Sidelying Lt shoulder ER/IR AROM with cues to not exceed 50 degrees of either motion 3x15 Seated Lt shoulder scaption AAROM with dowel 2x15 with 5-sec hold Seated Lt shoulder ER AAROM with dowel with cues to not exceed 50 degrees 2x15 with 5-sec hold Seated BIL dumbbell biceps curls with slow eccentric phase 3x10 Standing shoulder flexion AAROM with physioball at wall with gentle PT perturbations at end range 2x30 seconds Bent-over Lt shoulder pendulums 2x30sec Manual Therapy: N/A Neuromuscular re-ed: N/A Therapeutic Activity: N/A Modalities: N/A Self Care: N/A     ASSESSMENT:   CLINICAL IMPRESSION: Pt presents 7 weeks and 6 days s/p Lt reverse shoulder arthroplasty with Dr. Rise Paganini on 09/16/2021. Upon re-assessment of objective measures, the pt has made excellent progress in Lt shoulder global strength, ROM, functional ability (as indicated by FOTO score), and overhead lifting capacity. He tolerated progressed exercises, including supine forward shoulder flexion with light weight, well today. Pt will continue to benefit from skilled PT to address his primary impairments and return to his prior level of function with less limitation.     OBJECTIVE IMPAIRMENTS decreased mobility, decreased ROM, decreased strength, hypomobility, increased edema, impaired flexibility, impaired UE functional use, improper body mechanics, postural dysfunction, and pain.    ACTIVITY LIMITATIONS carrying, lifting, sleeping, bathing, dressing, reach over head, and hygiene/grooming   PARTICIPATION LIMITATIONS: meal prep, cleaning, laundry, driving, shopping, community activity, and yard work   PERSONAL FACTORS 1-2 comorbidities: See medical hx  are also affecting patient's functional outcome.      GOALS: Goals reviewed with patient? Yes   SHORT TERM  GOALS: Target date: 11/07/2021    Pt will report understanding and adherence  to initial HEP in order to promote independence in the management of primary impairments. Baseline: HEP provided at eval 10/23/2021: ACHIEVED Goal status: ACHIEVED   2.  Pt will achieve Lt shoulder elevation AROM of 150 degrees in order to progress post-op protocol. Baseline: Flexion: 122d, abduction: 130d 10/23/2021: Flexion 150d, abduction: 170d Goal status: ACHIEVED     LONG TERM GOALS: Target date: 12/05/2021   Pt will achieve a FOTO score of 61% in order to demonstrate improved functional ability as it relates to his primary impairments. Baseline: 32% 10/28/2021: 47% 11/11/2021: 53% Goal status: IN PROGRESS   2.  Pt will achieve Lt shoulder elevation AROM of 165 degrees in order to reach into overhead cabinets with less limitation. Baseline: Flexion: 122d, abductio: 130d 10/23/2021: Flexion: 150d, abduction 170d Goal status: IN PROGRESS   3.  Pt will achieve Lt shoulder IR and ER AROM of 60 degrees in order to get dressed with less limitation. Baseline: ER: 25d, IR: 30d 10/23/2021: ER: 50d, IR: 58d Goal status: IN PROGRESS   4.  Pt will achieve global Lt shoulder MMT of 4+/5 or greater in order to progress his independent strengthening regimen with less limitation. Baseline: See MMT chart 11/11/2021: 4+/5 globally except for Lt shoulder IR MMT Goal status: IN PROGRESS   5.  Pt will demonstrate ability to lift 10# overhead with Lt UE with 0-3/10 pain in order to put away storage items into his attic with less limitation. Baseline: Unable to lift with Lt UE due to post-op status 11/11/2021: 5# overhead lift with minor discomfort Goal status: IN PROGRESS       PLAN: PT FREQUENCY: 2x/week   PT DURATION: 8 weeks   PLANNED INTERVENTIONS: Therapeutic exercises, Therapeutic activity, Neuromuscular re-education, Patient/Family education, Self Care, Joint mobilization, Aquatic Therapy, Dry Needling,  Electrical stimulation, Spinal manipulation, Spinal mobilization, Cryotherapy, Moist heat, scar mobilization, Taping, Vasopneumatic device, Biofeedback, Ionotophoresis 4mg /ml Dexamethasone, Manual therapy, and Re-evaluation   PLAN FOR NEXT SESSION: Progress early shoulder ROM, strengthening in accordance with [facility] and [facility] Reverse Total Shoulder Arthoplasty Protocol   [facility], PT, DPT 11/11/21 11:16 AM

## 2021-11-13 ENCOUNTER — Ambulatory Visit: Payer: Medicare Other

## 2021-11-13 DIAGNOSIS — M6281 Muscle weakness (generalized): Secondary | ICD-10-CM | POA: Diagnosis not present

## 2021-11-13 DIAGNOSIS — R600 Localized edema: Secondary | ICD-10-CM | POA: Diagnosis not present

## 2021-11-13 DIAGNOSIS — M25612 Stiffness of left shoulder, not elsewhere classified: Secondary | ICD-10-CM | POA: Diagnosis not present

## 2021-11-13 DIAGNOSIS — G8929 Other chronic pain: Secondary | ICD-10-CM | POA: Diagnosis not present

## 2021-11-13 DIAGNOSIS — M25512 Pain in left shoulder: Secondary | ICD-10-CM | POA: Diagnosis not present

## 2021-11-13 NOTE — Therapy (Signed)
OUTPATIENT PHYSICAL THERAPY TREATMENT NOTE/ PROGRESS NOTE  Progress Note Reporting Period 10/10/2021 to 11/11/2021  See note below for Objective Data and Assessment of Progress/Goals.      Patient Name: Victor Ramirez MRN: [id number] DOB:[birth year], 66 y.o., male Today's Date: 11/13/2021  PCP: Wilfrid Lund, PA   REFERRING PROVIDER: Cammy Copa, MD   PT End of Session - 11/13/21 1006     Visit Number 11    Number of Visits 17    Date for PT Re-Evaluation 12/12/21    Authorization Type BCBS    Authorization Time Period FOTO v6, v10    Progress Note Due on Visit 10    PT Start Time 1005    PT Stop Time 1043    PT Time Calculation (min) 38 min    Activity Tolerance Patient tolerated treatment well    Behavior During Therapy WFL for tasks assessed/performed                     Past Medical History:  Diagnosis Date   Arthritis    Hemochromatosis    Hypertension    Past Surgical History:  Procedure Laterality Date   ANTERIOR LATERAL LUMBAR FUSION WITH PERCUTANEOUS SCREW 1 LEVEL Right 11/27/2016   Procedure: Lumbar Three-Four Transpsoas Lumbar InterbodyFfusion;  Surgeon: Ditty, Loura Halt, MD;  Location: [facility] OR;  Service: Neurosurgery;  Laterality: Right;  L3-4 Transpsoas lumbar interbody fusion/L3-4 Pedicle screw fixation with posterolateral arthrodesis/minimally invasive decompression/Mazor   APPLICATION OF ROBOTIC ASSISTANCE FOR SPINAL PROCEDURE N/A 11/27/2016   Procedure: Lumbar Three-Four Pedicle Screw Fixation with Posterolateral Arthrodesis with Minimally Invasive Decompression with Application of Robotic Assistance;  Surgeon: Ditty, Loura Halt, MD;  Location: [facility] OR;  Service: Neurosurgery;  Laterality: N/A;   BACK SURGERY     x3    BACK SURGERY     battery surgery    REVERSE SHOULDER ARTHROPLASTY Left 09/16/2021   Procedure: LEFT REVERSE SHOULDER ARTHROPLASTY;  Surgeon: Cammy Copa, MD;  Location: [facility] OR;  Service: Orthopedics;   Laterality: Left;   spinal manipulation under anesthesia     TONSILLECTOMY     Patient Active Problem List   Diagnosis Date Noted   Vitamin D deficiency 10/15/2021   Osteoporosis 10/15/2021   OA (osteoarthritis) of shoulder 09/16/2021   S/P reverse total shoulder arthroplasty, left 09/16/2021   Pain in right wrist 05/12/2021   RUQ abdominal pain    Acute pancreatitis without infection or necrosis 12/02/2020   Pain in left shoulder 11/28/2020   Rheumatoid factor positive 08/29/2020   Polymyalgia rheumatica (HCC) 08/08/2020   Polyarthralgia 08/08/2020   High risk medication use 08/08/2020   Lumbosacral spondylosis with radiculopathy 11/27/2016   Atrophy of muscle of right lower leg 02/04/2014   History of post-polio syndrome 02/02/2014   Pain of left lower extremity 02/02/2014   Chronic back pain 05/14/2013   HTN (hypertension) 05/14/2013   Headache 12/22/2011   Sleep apnea 11/17/2011   Elevation of level of transaminase or lactic acid dehydrogenase (LDH) 09/18/2010   Anxiety 09/08/2010   Tachycardia 09/08/2010   Hyperlipemia 03/01/2006   TIA (transient ischemic attack) 03/01/2006   Tobacco use disorder 03/01/2006   Depressive disorder 10/16/2005    THERAPY DIAG:  Chronic left shoulder pain  Decreased ROM of left shoulder  Muscle weakness (generalized)  Localized edema  REFERRING DIAG: W54.627 (ICD-10-CM) - History of arthroplasty of left shoulder  PERTINENT HISTORY: Left reverse shoulder arthroplasty with Dr. Rise Paganini on 09/16/2021, L1 fx  from 09/05/2021 (first orthopedic appoint scheduled for 10/16/2021), HTN  PRECAUTIONS/RESTRICTIONS:    Shoulder: Active and passive ROM as tolerated to pain tolerance (Utilizing [facility] and [facility] Reverse Total Shoulder Arthoplasty Protocol), avoid shoulder extension past neutral and combined shoulder adduction/IR x10-12 weeks  SUBJECTIVE: Pt denies any pain currently, although he reports improved sensation around the  shoulder recently. He also reports continued HEP adherence.   PAIN:  Are you having pain? Yes: NPRS scale: 0/10 Pain location: Lt shoulder Pain description: Achy, sharp Aggravating factors: reaching overhead, shoulder rotation Relieving factors: ice, compression, pain medications  OBJECTIVE:  DIAGNOSTIC FINDINGS:  10/01/2021: XR Shoulder Left: AP axillary outlet radiographs left shoulder reviewed.  Reverse shoulder  prosthesis in good position alignment with no complicating features.   PATIENT SURVEYS:  FOTO 32%, predicted 61% in 21 visits 10/28/2021: FOTO: 47% 11/11/2021: 53%   COGNITION:           Overall cognitive status: Within functional limits for tasks assessed                                  SENSATION: Not tested   POSTURE: Forward shoulders/ head   UPPER EXTREMITY ROM:    A/PROM Right eval Left eval Left 10/23/2021  Shoulder flexion   122p!/140 (springy end-feel) 150  Shoulder abduction   130p!/ 140 (springy end-feel) 170, p! At end range  Shoulder internal rotation   30/40p! 58  Shoulder external rotation   25/34p! 50  (Blank rows = not tested)   UPPER EXTREMITY MMT:   MMT Right eval Left eval Left 11/11/2021  Shoulder flexion 5/5 3+/5p! 4+/5, minor pain  Shoulder extension 5/5 3/5p! 4+/5  Shoulder abduction 5/5 3+/5p! 4+/5, minor pain  Shoulder internal rotation 5/5 3+/5p! 4/5, minor pain  Shoulder external rotation 5/5 3+/5p! 4+/5  Middle trapezius       Lower trapezius       Latissimus dorsi       Elbow flexion 5/5 4/5p! 4+/5  Elbow extension 5/5 5/5   Grip strength (lbs) 101 98   (Blank rows = not tested)     PALPATION:  TTP along surgical incision on Lt shoulder  FUNCTIONAL TESTS:  11/11/2021: 5# dumbbell overhead lift x1 with minor discomfort     HOME EXERCISE PROGRAM: Access Code: [email or web address] URL: [email or web address] Date: 10/10/2021 Prepared by: Carmelina Dane   Exercises - Shoulder Scaption AAROM with Dowel  (Mirrored)  - 2 x daily - 7 x weekly - 3 sets - 10 reps - 5 seconds hold - Standing Shoulder External Rotation AAROM with Dowel  - 2 x daily - 7 x weekly - 3 sets - 10 reps - 5 seconds hold - Isometric shoulder flexion at wall with 50% force  - 1 x daily - 7 x weekly - 2 sets - 10 reps - 5 seconds hold - Isometric shoulder external rotation at wall with 50% force  - 1 x daily - 7 x weekly - 2 sets - 10 reps - 5 seconds hold - Isometric shoulder abduction at wall with 50% force  - 1 x daily - 7 x weekly - 2 sets - 10 reps - 5 seconds hold   [facility] Adult PT Treatment:  DATE: 11/13/2021 Therapeutic Exercise: Rt sidelying Lt shoulder ER with 3# dumbbell 3x10 Lt sidelying Lt shoulder IR with 3# dumbbell 3x10 Seated Lt shoulder scaption AAROM with dowel 2x10 with 5-sec hold at end range Seated BIL shoulder scaption with 3# dumbbells to 90 degrees elevation 3x10 Standing Lt shoulder flexion UE Ranger AAROM x10 with 10-sec hold at end-range Standing Lt shoulder abduction UE Ranger AAROM x10 with 10-sec hold at end-range Manual Therapy: N/A Neuromuscular re-ed: N/A Therapeutic Activity: N/A Modalities: N/A Self Care: N/A   [facility] Adult PT Treatment:                                                DATE: 11/11/2021 Therapeutic Exercise: Supine Lt forward shoulder flexion with 2# dumbbell 3x10 Rt sidelying Lt shoulder ER with 2# dumbbell 3x10 Seated Lt shoulder scaption AAROM with dowel 2x10 with 5-sec hold Seated Lt shoulder scaption AROM with cues to avoid shrug 3x10 Manual Therapy: N/A Neuromuscular re-ed: N/A Therapeutic Activity: Re-assessment of objective measures with pt education Re-administration of FOTO with pt education Modalities: N/A Self Care: N/A   [facility] Adult PT Treatment:                                                DATE: 11/06/2021 Therapeutic Exercise: Seated Lt shoulder scaption AAROM with dowel 3x15 with 5-sec hold Seated  Lt shoulder ER AAROM with dowel with cues to not exceed 50 degrees 3x15 with 5-sec hold Seated BIL shoulder scaption AROM with cues to avoid scapular elevation and tactile cuing for scapular upward rotation 2x15 Sidelying Lt shoulder ER/IR AROM with 1# dumbbell with cues to not exceed 50 degrees of either motion 3x15 Seated BIL dumbbell biceps curls with 7# dumbbells with slow eccentric phase 3x10 Standing shoulder flexion AAROM with physioball at wall with gentle PT perturbations at end range 3x30 seconds Bent-over Lt shoulder pendulums 3x30sec Manual Therapy: N/A Neuromuscular re-ed: N/A Therapeutic Activity: N/A Modalities: N/A Self Care: N/A      ASSESSMENT:   CLINICAL IMPRESSION: Pt presents 8 weeks and 1 day s/p Lt reverse shoulder arthroplasty with Dr. Rise Paganini on 09/16/2021. Pt continues to progress well in accordance with post-op protocol. He was able to tolerate increased load with appropriate RTC strengthening exercises today and will continue to benefit from skilled PT to address his primary impairments and return to his prior level of function with less limitation.      OBJECTIVE IMPAIRMENTS decreased mobility, decreased ROM, decreased strength, hypomobility, increased edema, impaired flexibility, impaired UE functional use, improper body mechanics, postural dysfunction, and pain.    ACTIVITY LIMITATIONS carrying, lifting, sleeping, bathing, dressing, reach over head, and hygiene/grooming   PARTICIPATION LIMITATIONS: meal prep, cleaning, laundry, driving, shopping, community activity, and yard work   PERSONAL FACTORS 1-2 comorbidities: See medical hx  are also affecting patient's functional outcome.      GOALS: Goals reviewed with patient? Yes   SHORT TERM GOALS: Target date: 11/07/2021    Pt will report understanding and adherence to initial HEP in order to promote independence in the management of primary impairments. Baseline: HEP provided at  eval 10/23/2021: ACHIEVED Goal status: ACHIEVED   2.  Pt will achieve Lt shoulder elevation AROM of  150 degrees in order to progress post-op protocol. Baseline: Flexion: 122d, abduction: 130d 10/23/2021: Flexion 150d, abduction: 170d Goal status: ACHIEVED     LONG TERM GOALS: Target date: 12/05/2021   Pt will achieve a FOTO score of 61% in order to demonstrate improved functional ability as it relates to his primary impairments. Baseline: 32% 10/28/2021: 47% 11/11/2021: 53% Goal status: IN PROGRESS   2.  Pt will achieve Lt shoulder elevation AROM of 165 degrees in order to reach into overhead cabinets with less limitation. Baseline: Flexion: 122d, abductio: 130d 10/23/2021: Flexion: 150d, abduction 170d Goal status: IN PROGRESS   3.  Pt will achieve Lt shoulder IR and ER AROM of 60 degrees in order to get dressed with less limitation. Baseline: ER: 25d, IR: 30d 10/23/2021: ER: 50d, IR: 58d Goal status: IN PROGRESS   4.  Pt will achieve global Lt shoulder MMT of 4+/5 or greater in order to progress his independent strengthening regimen with less limitation. Baseline: See MMT chart 11/11/2021: 4+/5 globally except for Lt shoulder IR MMT Goal status: IN PROGRESS   5.  Pt will demonstrate ability to lift 10# overhead with Lt UE with 0-3/10 pain in order to put away storage items into his attic with less limitation. Baseline: Unable to lift with Lt UE due to post-op status 11/11/2021: 5# overhead lift with minor discomfort Goal status: IN PROGRESS       PLAN: PT FREQUENCY: 2x/week   PT DURATION: 8 weeks   PLANNED INTERVENTIONS: Therapeutic exercises, Therapeutic activity, Neuromuscular re-education, Patient/Family education, Self Care, Joint mobilization, Aquatic Therapy, Dry Needling, Electrical stimulation, Spinal manipulation, Spinal mobilization, Cryotherapy, Moist heat, scar mobilization, Taping, Vasopneumatic device, Biofeedback, Ionotophoresis 4mg /ml Dexamethasone, Manual  therapy, and Re-evaluation   PLAN FOR NEXT SESSION: Progress early shoulder ROM, strengthening in accordance with [facility] and [facility] Reverse Total Shoulder Arthoplasty Protocol   [facility], PT, DPT 11/13/21 10:43 AM

## 2021-11-15 ENCOUNTER — Ambulatory Visit
Admission: RE | Admit: 2021-11-15 | Discharge: 2021-11-15 | Disposition: A | Discharge status: Home / Self Care | Payer: Medicare Other | Source: Ambulatory Visit | Attending: Neurosurgery | Admitting: Neurosurgery

## 2021-11-15 DIAGNOSIS — S32010A Wedge compression fracture of first lumbar vertebra, initial encounter for closed fracture: Secondary | ICD-10-CM

## 2021-11-15 DIAGNOSIS — M48061 Spinal stenosis, lumbar region without neurogenic claudication: Secondary | ICD-10-CM | POA: Diagnosis not present

## 2021-11-15 DIAGNOSIS — M545 Low back pain, unspecified: Secondary | ICD-10-CM | POA: Diagnosis not present

## 2021-11-19 DIAGNOSIS — I10 Essential (primary) hypertension: Secondary | ICD-10-CM | POA: Diagnosis not present

## 2021-11-19 DIAGNOSIS — M353 Polymyalgia rheumatica: Secondary | ICD-10-CM | POA: Diagnosis not present

## 2021-11-19 DIAGNOSIS — G8929 Other chronic pain: Secondary | ICD-10-CM | POA: Diagnosis not present

## 2021-11-19 DIAGNOSIS — E7800 Pure hypercholesterolemia, unspecified: Secondary | ICD-10-CM | POA: Diagnosis not present

## 2021-11-26 DIAGNOSIS — S32010A Wedge compression fracture of first lumbar vertebra, initial encounter for closed fracture: Secondary | ICD-10-CM | POA: Diagnosis not present

## 2021-11-26 DIAGNOSIS — M5136 Other intervertebral disc degeneration, lumbar region: Secondary | ICD-10-CM | POA: Diagnosis not present

## 2021-11-27 ENCOUNTER — Ambulatory Visit: Payer: Medicare Other

## 2021-11-27 DIAGNOSIS — M6281 Muscle weakness (generalized): Secondary | ICD-10-CM | POA: Diagnosis not present

## 2021-11-27 DIAGNOSIS — M25612 Stiffness of left shoulder, not elsewhere classified: Secondary | ICD-10-CM | POA: Diagnosis not present

## 2021-11-27 DIAGNOSIS — G8929 Other chronic pain: Secondary | ICD-10-CM | POA: Diagnosis not present

## 2021-11-27 DIAGNOSIS — R600 Localized edema: Secondary | ICD-10-CM | POA: Diagnosis not present

## 2021-11-27 DIAGNOSIS — M25512 Pain in left shoulder: Secondary | ICD-10-CM | POA: Diagnosis not present

## 2021-11-27 NOTE — Therapy (Signed)
OUTPATIENT PHYSICAL THERAPY TREATMENT NOTE/ PROGRESS NOTE  Progress Note Reporting Period 10/10/2021 to 11/11/2021  See note below for Objective Data and Assessment of Progress/Goals.      Patient Name: Victor Ramirez MRN: [id number] DOB:[birth year], 66 y.o., male Today's Date: 11/27/2021  PCP: Wilfrid Lund, [place]   REFERRING PROVIDER: Cammy Copa, MD   PT End of Session - 11/27/21 1049     Visit Number 12    Number of Visits 17    Date for PT Re-Evaluation 12/12/21    Authorization Type BCBS    Authorization Time Period FOTO v6, v10    Progress Note Due on Visit 10    PT Start Time 1050    PT Stop Time 1130    PT Time Calculation (min) 40 min    Activity Tolerance Patient tolerated treatment well    Behavior During Therapy WFL for tasks assessed/performed                      Past Medical History:  Diagnosis Date   Arthritis    Hemochromatosis    Hypertension    Past Surgical History:  Procedure Laterality Date   ANTERIOR LATERAL LUMBAR FUSION WITH PERCUTANEOUS SCREW 1 LEVEL Right 11/27/2016   Procedure: Lumbar Three-Four Transpsoas Lumbar InterbodyFfusion;  Surgeon: Ditty, Loura Halt, MD;  Location: [facility] OR;  Service: Neurosurgery;  Laterality: Right;  L3-4 Transpsoas lumbar interbody fusion/L3-4 Pedicle screw fixation with posterolateral arthrodesis/minimally invasive decompression/Mazor   APPLICATION OF ROBOTIC ASSISTANCE FOR SPINAL PROCEDURE N/A 11/27/2016   Procedure: Lumbar Three-Four Pedicle Screw Fixation with Posterolateral Arthrodesis with Minimally Invasive Decompression with Application of Robotic Assistance;  Surgeon: Ditty, Loura Halt, MD;  Location: [facility] OR;  Service: Neurosurgery;  Laterality: N/A;   BACK SURGERY     x3    BACK SURGERY     battery surgery    REVERSE SHOULDER ARTHROPLASTY Left 09/16/2021   Procedure: LEFT REVERSE SHOULDER ARTHROPLASTY;  Surgeon: Cammy Copa, MD;  Location: [facility] OR;  Service: Orthopedics;   Laterality: Left;   spinal manipulation under anesthesia     TONSILLECTOMY     Patient Active Problem List   Diagnosis Date Noted   Vitamin D deficiency 10/15/2021   Osteoporosis 10/15/2021   OA (osteoarthritis) of shoulder 09/16/2021   S/P reverse total shoulder arthroplasty, left 09/16/2021   Pain in right wrist 05/12/2021   RUQ abdominal pain    Acute pancreatitis without infection or necrosis 12/02/2020   Pain in left shoulder 11/28/2020   Rheumatoid factor positive 08/29/2020   Polymyalgia rheumatica (HCC) 08/08/2020   Polyarthralgia 08/08/2020   High risk medication use 08/08/2020   Lumbosacral spondylosis with radiculopathy 11/27/2016   Atrophy of muscle of right lower leg 02/04/2014   History of post-polio syndrome 02/02/2014   Pain of left lower extremity 02/02/2014   Chronic back pain 05/14/2013   HTN (hypertension) 05/14/2013   Headache 12/22/2011   Sleep apnea 11/17/2011   Elevation of level of transaminase or lactic acid dehydrogenase (LDH) 09/18/2010   Anxiety 09/08/2010   Tachycardia 09/08/2010   Hyperlipemia 03/01/2006   TIA (transient ischemic attack) 03/01/2006   Tobacco use disorder 03/01/2006   Depressive disorder 10/16/2005    THERAPY DIAG:  Chronic left shoulder pain  Decreased ROM of left shoulder  Muscle weakness (generalized)  Localized edema  REFERRING DIAG: I29.798 (ICD-10-CM) - History of arthroplasty of left shoulder  PERTINENT HISTORY: Left reverse shoulder arthroplasty with Dr. Rise Paganini on 09/16/2021, L1  fx from 09/05/2021 (first orthopedic appoint scheduled for 10/16/2021), HTN  PRECAUTIONS/RESTRICTIONS:    Shoulder: Active and passive ROM as tolerated to pain tolerance (Utilizing [facility] and [facility] Reverse Total Shoulder Arthoplasty Protocol), avoid shoulder extension past neutral and combined shoulder adduction/IR x10-12 weeks  SUBJECTIVE: Pt reports 0/10 pain currently, although he reports increased fatigue with activity  in his Lt shoulder. He also reports his PMR has been very bad lately. He reports continued HEP adherence despite this.   PAIN:  Are you having pain? Yes: NPRS scale: 0/10 Pain location: Lt shoulder Pain description: Achy, sharp Aggravating factors: reaching overhead, shoulder rotation Relieving factors: ice, compression, pain medications  OBJECTIVE:  DIAGNOSTIC FINDINGS:  10/01/2021: XR Shoulder Left: AP axillary outlet radiographs left shoulder reviewed.  Reverse shoulder  prosthesis in good position alignment with no complicating features.  11/15/2021: MR Lumbar Spine WO Contrast: IMPRESSION: 1. L1 and L5 superior endplate fractures with mild height loss, new since 2019. Limited associated marrow edema, likely recent but nearly healed. 2. L2-3 progressive adjacent segment degeneration with compressive spinal stenosis and moderate left foraminal narrowing. 3. L3-S1 solid fusion.     PATIENT SURVEYS:  FOTO 32%, predicted 61% in 21 visits 10/28/2021: FOTO: 47% 11/11/2021: 53%   COGNITION:           Overall cognitive status: Within functional limits for tasks assessed                                  SENSATION: Not tested   POSTURE: Forward shoulders/ head   UPPER EXTREMITY ROM:    A/PROM Right eval Left eval Left 10/23/2021 Left 11/27/2021  Shoulder flexion   122p!/140 (springy end-feel) 150 155  Shoulder abduction   130p!/ 140 (springy end-feel) 170, p! At end range 175  Shoulder internal rotation   30/40p! 58 70  Shoulder external rotation   25/34p! 50 60, mild p!  (Blank rows = not tested)   UPPER EXTREMITY MMT:   MMT Right eval Left eval Left 11/11/2021  Shoulder flexion 5/5 3+/5p! 4+/5, minor pain  Shoulder extension 5/5 3/5p! 4+/5  Shoulder abduction 5/5 3+/5p! 4+/5, minor pain  Shoulder internal rotation 5/5 3+/5p! 4/5, minor pain  Shoulder external rotation 5/5 3+/5p! 4+/5  Middle trapezius       Lower trapezius       Latissimus dorsi       Elbow  flexion 5/5 4/5p! 4+/5  Elbow extension 5/5 5/5   Grip strength (lbs) 101 98   (Blank rows = not tested)     PALPATION:  TTP along surgical incision on Lt shoulder  FUNCTIONAL TESTS:  11/11/2021: 5# dumbbell overhead lift x1 with minor discomfort     HOME EXERCISE PROGRAM: Access Code: [email or web address] URL: [email or web address] Date: 10/10/2021 Prepared by: Carmelina Dane   Exercises - Shoulder Scaption AAROM with Dowel (Mirrored)  - 2 x daily - 7 x weekly - 3 sets - 10 reps - 5 seconds hold - Standing Shoulder External Rotation AAROM with Dowel  - 2 x daily - 7 x weekly - 3 sets - 10 reps - 5 seconds hold - Isometric shoulder flexion at wall with 50% force  - 1 x daily - 7 x weekly - 2 sets - 10 reps - 5 seconds hold - Isometric shoulder external rotation at wall with 50% force  - 1 x daily - 7 x weekly - 2 sets - 10 reps -  5 seconds hold - Isometric shoulder abduction at wall with 50% force  - 1 x daily - 7 x weekly - 2 sets - 10 reps - 5 seconds hold   [facility] Adult PT Treatment:                                                DATE: 11/27/2021 Therapeutic Exercise: Rt sidelying Lt shoulder ER with 5# dumbbell 3x10 Lt sidelying Lt shoulder IR with 3# dumbbell 3x10 Seated low rows with 15# 2x10 Seated high rows with 15# 2x10 Seated lat pull-downs with 15# 2x10 Seated shoulder rolls 2x10 forward and backward Standing BIL shoulder scaption with 2# dumbbells with back to wall 2x10 Standing corner pec stretch x25min with light stretch Manual Therapy: N/A Neuromuscular re-ed: N/A Therapeutic Activity: N/A Modalities: N/A Self Care: N/A   [facility] Adult PT Treatment:                                                DATE: 11/13/2021 Therapeutic Exercise: Rt sidelying Lt shoulder ER with 3# dumbbell 3x10 Lt sidelying Lt shoulder IR with 3# dumbbell 3x10 Seated Lt shoulder scaption AAROM with dowel 2x10 with 5-sec hold at end range Seated BIL shoulder scaption with 3#  dumbbells to 90 degrees elevation 3x10 Standing Lt shoulder flexion UE Ranger AAROM x10 with 10-sec hold at end-range Standing Lt shoulder abduction UE Ranger AAROM x10 with 10-sec hold at end-range Manual Therapy: N/A Neuromuscular re-ed: N/A Therapeutic Activity: N/A Modalities: N/A Self Care: N/A   [facility] Adult PT Treatment:                                                DATE: 11/11/2021 Therapeutic Exercise: Supine Lt forward shoulder flexion with 2# dumbbell 3x10 Rt sidelying Lt shoulder ER with 2# dumbbell 3x10 Seated Lt shoulder scaption AAROM with dowel 2x10 with 5-sec hold Seated Lt shoulder scaption AROM with cues to avoid shrug 3x10 Manual Therapy: N/A Neuromuscular re-ed: N/A Therapeutic Activity: Re-assessment of objective measures with pt education Re-administration of FOTO with pt education Modalities: N/A Self Care: N/A     ASSESSMENT:   CLINICAL IMPRESSION: Pt presents 10 weeks and 2 days s/p Lt reverse shoulder arthroplasty with Dr. Rise Paganini on 09/16/2021. Pt recently received his lumbar spine MRI results, which indicate L1 and L5 superior end plate fxs while his L3-S1 fusion is still in place. Pt reports his doctor recommended PT, which the pt is considering for future care. Upon re-assessment of global Lt shoulder AROM, the pt has made excellent progress in all planes. He tolerated progressed exercises well and will continue to benefit from skilled PT to address his primary impairments and return to his prior level of function with less limitation.     OBJECTIVE IMPAIRMENTS decreased mobility, decreased ROM, decreased strength, hypomobility, increased edema, impaired flexibility, impaired UE functional use, improper body mechanics, postural dysfunction, and pain.    ACTIVITY LIMITATIONS carrying, lifting, sleeping, bathing, dressing, reach over head, and hygiene/grooming   PARTICIPATION LIMITATIONS: meal prep, cleaning, laundry, driving, shopping,  community activity, and yard work  PERSONAL FACTORS 1-2 comorbidities: See medical hx  are also affecting patient's functional outcome.      GOALS: Goals reviewed with patient? Yes   SHORT TERM GOALS: Target date: 11/07/2021    Pt will report understanding and adherence to initial HEP in order to promote independence in the management of primary impairments. Baseline: HEP provided at eval 10/23/2021: ACHIEVED Goal status: ACHIEVED   2.  Pt will achieve Lt shoulder elevation AROM of 150 degrees in order to progress post-op protocol. Baseline: Flexion: 122d, abduction: 130d 10/23/2021: Flexion 150d, abduction: 170d Goal status: ACHIEVED     LONG TERM GOALS: Target date: 12/05/2021   Pt will achieve a FOTO score of 61% in order to demonstrate improved functional ability as it relates to his primary impairments. Baseline: 32% 10/28/2021: 47% 11/11/2021: 53% Goal status: IN PROGRESS   2.  Pt will achieve Lt shoulder elevation AROM of 165 degrees in order to reach into overhead cabinets with less limitation. Baseline: Flexion: 122d, abductio: 130d 10/23/2021: Flexion: 150d, abduction 170d 11/27/2021: Flexion: 155d, abduction 175d Goal status: IN PROGRESS   3.  Pt will achieve Lt shoulder IR and ER AROM of 60 degrees in order to get dressed with less limitation. Baseline: ER: 25d, IR: 30d 10/23/2021: ER: 50d, IR: 58d 11/23/2021: ER: 60d, IR: 70d Goal status: ACHIEVED   4.  Pt will achieve global Lt shoulder MMT of 4+/5 or greater in order to progress his independent strengthening regimen with less limitation. Baseline: See MMT chart 11/11/2021: 4+/5 globally except for Lt shoulder IR MMT Goal status: IN PROGRESS   5.  Pt will demonstrate ability to lift 10# overhead with Lt UE with 0-3/10 pain in order to put away storage items into his attic with less limitation. Baseline: Unable to lift with Lt UE due to post-op status 11/11/2021: 5# overhead lift with minor discomfort Goal status: IN  PROGRESS       PLAN: PT FREQUENCY: 2x/week   PT DURATION: 8 weeks   PLANNED INTERVENTIONS: Therapeutic exercises, Therapeutic activity, Neuromuscular re-education, Patient/Family education, Self Care, Joint mobilization, Aquatic Therapy, Dry Needling, Electrical stimulation, Spinal manipulation, Spinal mobilization, Cryotherapy, Moist heat, scar mobilization, Taping, Vasopneumatic device, Biofeedback, Ionotophoresis 4mg /ml Dexamethasone, Manual therapy, and Re-evaluation   PLAN FOR NEXT SESSION: Progress early shoulder ROM, strengthening in accordance with [facility] and [facility] Reverse Total Shoulder Arthoplasty Protocol   Vanessa [place], PT, DPT 11/27/21 11:31 AM

## 2021-11-28 ENCOUNTER — Ambulatory Visit: Payer: Medicare Other

## 2021-11-28 DIAGNOSIS — G8929 Other chronic pain: Secondary | ICD-10-CM | POA: Diagnosis not present

## 2021-11-28 DIAGNOSIS — M6281 Muscle weakness (generalized): Secondary | ICD-10-CM

## 2021-11-28 DIAGNOSIS — M25512 Pain in left shoulder: Secondary | ICD-10-CM | POA: Diagnosis not present

## 2021-11-28 DIAGNOSIS — M25612 Stiffness of left shoulder, not elsewhere classified: Secondary | ICD-10-CM

## 2021-11-28 DIAGNOSIS — R600 Localized edema: Secondary | ICD-10-CM

## 2021-11-28 NOTE — Therapy (Signed)
OUTPATIENT PHYSICAL THERAPY TREATMENT NOTE/ PROGRESS NOTE    Patient Name: Victor Ramirez MRN: [id number] DOB:21-Jun-1955, 66 y.o., male Today's Date: 11/28/2021  PCP: Lois Huxley, [place]   REFERRING PROVIDER: Meredith Pel, MD   PT End of Session - 11/28/21 1004     Visit Number 13    Number of Visits 17    Date for PT Re-Evaluation 12/12/21    Authorization Type BCBS    Authorization Time Period FOTO v6, v10    Progress Note Due on Visit 10    PT Start Time 1005    PT Stop Time 1045    PT Time Calculation (min) 40 min    Activity Tolerance Patient tolerated treatment well    Behavior During Therapy WFL for tasks assessed/performed                       Past Medical History:  Diagnosis Date   Arthritis    Hemochromatosis    Hypertension    Past Surgical History:  Procedure Laterality Date   ANTERIOR LATERAL LUMBAR FUSION WITH PERCUTANEOUS SCREW 1 LEVEL Right 11/27/2016   Procedure: Lumbar Three-Four Transpsoas Lumbar InterbodyFfusion;  Surgeon: Ditty, Kevan Ny, MD;  Location: [place];  Service: Neurosurgery;  Laterality: Right;  L3-4 Transpsoas lumbar interbody fusion/L3-4 Pedicle screw fixation with posterolateral arthrodesis/minimally invasive decompression/Mazor   APPLICATION OF ROBOTIC ASSISTANCE FOR SPINAL PROCEDURE N/A 11/27/2016   Procedure: Lumbar Three-Four Pedicle Screw Fixation with Posterolateral Arthrodesis with Minimally Invasive Decompression with Application of Robotic Assistance;  Surgeon: Ditty, Kevan Ny, MD;  Location: [place];  Service: Neurosurgery;  Laterality: N/A;   BACK SURGERY     x3    BACK SURGERY     battery surgery    REVERSE SHOULDER ARTHROPLASTY Left 09/16/2021   Procedure: LEFT REVERSE SHOULDER ARTHROPLASTY;  Surgeon: Meredith Pel, MD;  Location: [place];  Service: Orthopedics;  Laterality: Left;   spinal manipulation under anesthesia     TONSILLECTOMY     Patient Active Problem List   Diagnosis Date  Noted   Vitamin D deficiency 10/15/2021   Osteoporosis 10/15/2021   OA (osteoarthritis) of shoulder 09/16/2021   S/P reverse total shoulder arthroplasty, left 09/16/2021   Pain in right wrist 05/12/2021   RUQ abdominal pain    Acute pancreatitis without infection or necrosis 12/02/2020   Pain in left shoulder 11/28/2020   Rheumatoid factor positive 08/29/2020   Polymyalgia rheumatica ([place]) 08/08/2020   Polyarthralgia 08/08/2020   High risk medication use 08/08/2020   Lumbosacral spondylosis with radiculopathy 11/27/2016   Atrophy of muscle of right lower leg 02/04/2014   History of post-polio syndrome 02/02/2014   Pain of left lower extremity 02/02/2014   Chronic back pain 05/14/2013   HTN (hypertension) 05/14/2013   Headache 12/22/2011   Sleep apnea 11/17/2011   Elevation of level of transaminase or lactic acid dehydrogenase (LDH) 09/18/2010   Anxiety 09/08/2010   Tachycardia 09/08/2010   Hyperlipemia 03/01/2006   TIA (transient ischemic attack) 03/01/2006   Tobacco use disorder 03/01/2006   Depressive disorder 10/16/2005    THERAPY DIAG:  Chronic left shoulder pain  Decreased ROM of left shoulder  Muscle weakness (generalized)  Localized edema  REFERRING DIAG: Y07.371 (ICD-10-CM) - History of arthroplasty of left shoulder  PERTINENT HISTORY: Left reverse shoulder arthroplasty with Dr. Marcene Duos on 09/16/2021, L1 fx from 09/05/2021 (first orthopedic appoint scheduled for 10/16/2021), HTN  PRECAUTIONS/RESTRICTIONS:    Shoulder: Active and passive ROM  as tolerated to pain tolerance (Utilizing [facility] and [facility] Reverse Total Shoulder Arthoplasty Protocol), avoid shoulder extension past neutral and combined shoulder adduction/IR x10-12 weeks  SUBJECTIVE: Pt denies any pain today, reporting only stiffness today.   PAIN:  Are you having pain? Yes: NPRS scale: 0/10 Pain location: Lt shoulder Pain description: Achy, sharp Aggravating factors: reaching overhead,  shoulder rotation Relieving factors: ice, compression, pain medications  OBJECTIVE:  DIAGNOSTIC FINDINGS:  10/01/2021: XR Shoulder Left: AP axillary outlet radiographs left shoulder reviewed.  Reverse shoulder  prosthesis in good position alignment with no complicating features.  11/15/2021: MR Lumbar Spine WO Contrast: IMPRESSION: 1. L1 and L5 superior endplate fractures with mild height loss, new since 2019. Limited associated marrow edema, likely recent but nearly healed. 2. L2-3 progressive adjacent segment degeneration with compressive spinal stenosis and moderate left foraminal narrowing. 3. L3-S1 solid fusion.     PATIENT SURVEYS:  FOTO 32%, predicted 61% in 21 visits 10/28/2021: FOTO: 47% 11/11/2021: 53%   COGNITION:           Overall cognitive status: Within functional limits for tasks assessed                                  SENSATION: Not tested   POSTURE: Forward shoulders/ head   UPPER EXTREMITY ROM:    A/PROM Right eval Left eval Left 10/23/2021 Left 11/27/2021  Shoulder flexion   122p!/140 (springy end-feel) 150 155  Shoulder abduction   130p!/ 140 (springy end-feel) 170, p! At end range 175  Shoulder internal rotation   30/40p! 58 70  Shoulder external rotation   25/34p! 50 60, mild p!  (Blank rows = not tested)   UPPER EXTREMITY MMT:   MMT Right eval Left eval Left 11/11/2021  Shoulder flexion 5/5 3+/5p! 4+/5, minor pain  Shoulder extension 5/5 3/5p! 4+/5  Shoulder abduction 5/5 3+/5p! 4+/5, minor pain  Shoulder internal rotation 5/5 3+/5p! 4/5, minor pain  Shoulder external rotation 5/5 3+/5p! 4+/5  Middle trapezius       Lower trapezius       Latissimus dorsi       Elbow flexion 5/5 4/5p! 4+/5  Elbow extension 5/5 5/5   Grip strength (lbs) 101 98   (Blank rows = not tested)     PALPATION:  TTP along surgical incision on Lt shoulder  FUNCTIONAL TESTS:  11/11/2021: 5# dumbbell overhead lift x1 with minor discomfort     HOME EXERCISE  PROGRAM: Access Code: [email or web address] URL: [email or web address] Date: 10/10/2021 Prepared by: Carmelina Dane   Exercises - Shoulder Scaption AAROM with Dowel (Mirrored)  - 2 x daily - 7 x weekly - 3 sets - 10 reps - 5 seconds hold - Standing Shoulder External Rotation AAROM with Dowel  - 2 x daily - 7 x weekly - 3 sets - 10 reps - 5 seconds hold - Isometric shoulder flexion at wall with 50% force  - 1 x daily - 7 x weekly - 2 sets - 10 reps - 5 seconds hold - Isometric shoulder external rotation at wall with 50% force  - 1 x daily - 7 x weekly - 2 sets - 10 reps - 5 seconds hold - Isometric shoulder abduction at wall with 50% force  - 1 x daily - 7 x weekly - 2 sets - 10 reps - 5 seconds hold   [facility] Adult PT Treatment:  DATE: 11/28/2021 Therapeutic Exercise: Seated low rows with 20# 2x10 Seated high rows with 15# 2x10 Seated lat pull-downs with 15# 2x10 Seated shoulder rolls 2x10 forward Standing BIL shoulder ER with 3# cables 3x10 Standing Lt shoulder IR with 3# cable 3x10 Lt shoulder flexion AAROM with UE Ranger 2x10 with 5-sec hold at end range Lt shoulder abduction AAROM with UE Ranger 2x10 with 5-sec hold at end range Manual Therapy: N/A Neuromuscular re-ed: N/A Therapeutic Activity: N/A Modalities: N/A Self Care: N/A   [facility] Adult PT Treatment:                                                DATE: 11/27/2021 Therapeutic Exercise: Rt sidelying Lt shoulder ER with 5# dumbbell 3x10 Lt sidelying Lt shoulder IR with 3# dumbbell 3x10 Seated low rows with 15# 2x10 Seated high rows with 15# 2x10 Seated lat pull-downs with 15# 2x10 Seated shoulder rolls 2x10 forward and backward Standing BIL shoulder scaption with 2# dumbbells with back to wall 2x10 Standing corner pec stretch x58min with light stretch Manual Therapy: N/A Neuromuscular re-ed: N/A Therapeutic Activity: N/A Modalities: N/A Self Care: N/A   [facility]  Adult PT Treatment:                                                DATE: 11/13/2021 Therapeutic Exercise: Rt sidelying Lt shoulder ER with 3# dumbbell 3x10 Lt sidelying Lt shoulder IR with 3# dumbbell 3x10 Seated Lt shoulder scaption AAROM with dowel 2x10 with 5-sec hold at end range Seated BIL shoulder scaption with 3# dumbbells to 90 degrees elevation 3x10 Standing Lt shoulder flexion UE Ranger AAROM x10 with 10-sec hold at end-range Standing Lt shoulder abduction UE Ranger AAROM x10 with 10-sec hold at end-range Manual Therapy: N/A Neuromuscular re-ed: N/A Therapeutic Activity: N/A Modalities: N/A Self Care: N/A     ASSESSMENT:   CLINICAL IMPRESSION: Pt presents 10 weeks and 4 days s/p Lt reverse shoulder arthroplasty with Dr. Rise Paganini on 09/16/2021. Pt continues to progress well with exercises designed to strengthen RTC and parascapular musculature, as well as ROM exercises. Pt is approaching phase III of his post-op protocol. He will continue to benefit from skilled PT to address his primary impairments and return to his prior level of function with less limitation.     OBJECTIVE IMPAIRMENTS decreased mobility, decreased ROM, decreased strength, hypomobility, increased edema, impaired flexibility, impaired UE functional use, improper body mechanics, postural dysfunction, and pain.    ACTIVITY LIMITATIONS carrying, lifting, sleeping, bathing, dressing, reach over head, and hygiene/grooming   PARTICIPATION LIMITATIONS: meal prep, cleaning, laundry, driving, shopping, community activity, and yard work   PERSONAL FACTORS 1-2 comorbidities: See medical hx  are also affecting patient's functional outcome.      GOALS: Goals reviewed with patient? Yes   SHORT TERM GOALS: Target date: 11/07/2021    Pt will report understanding and adherence to initial HEP in order to promote independence in the management of primary impairments. Baseline: HEP provided at eval 10/23/2021:  ACHIEVED Goal status: ACHIEVED   2.  Pt will achieve Lt shoulder elevation AROM of 150 degrees in order to progress post-op protocol. Baseline: Flexion: 122d, abduction: 130d 10/23/2021: Flexion 150d, abduction: 170d Goal status: ACHIEVED  LONG TERM GOALS: Target date: 12/05/2021   Pt will achieve a FOTO score of 61% in order to demonstrate improved functional ability as it relates to his primary impairments. Baseline: 32% 10/28/2021: 47% 11/11/2021: 53% Goal status: IN PROGRESS   2.  Pt will achieve Lt shoulder elevation AROM of 165 degrees in order to reach into overhead cabinets with less limitation. Baseline: Flexion: 122d, abductio: 130d 10/23/2021: Flexion: 150d, abduction 170d 11/27/2021: Flexion: 155d, abduction 175d Goal status: IN PROGRESS   3.  Pt will achieve Lt shoulder IR and ER AROM of 60 degrees in order to get dressed with less limitation. Baseline: ER: 25d, IR: 30d 10/23/2021: ER: 50d, IR: 58d 11/23/2021: ER: 60d, IR: 70d Goal status: ACHIEVED   4.  Pt will achieve global Lt shoulder MMT of 4+/5 or greater in order to progress his independent strengthening regimen with less limitation. Baseline: See MMT chart 11/11/2021: 4+/5 globally except for Lt shoulder IR MMT Goal status: IN PROGRESS   5.  Pt will demonstrate ability to lift 10# overhead with Lt UE with 0-3/10 pain in order to put away storage items into his attic with less limitation. Baseline: Unable to lift with Lt UE due to post-op status 11/11/2021: 5# overhead lift with minor discomfort Goal status: IN PROGRESS       PLAN: PT FREQUENCY: 2x/week   PT DURATION: 8 weeks   PLANNED INTERVENTIONS: Therapeutic exercises, Therapeutic activity, Neuromuscular re-education, Patient/Family education, Self Care, Joint mobilization, Aquatic Therapy, Dry Needling, Electrical stimulation, Spinal manipulation, Spinal mobilization, Cryotherapy, Moist heat, scar mobilization, Taping, Vasopneumatic device, Biofeedback,  Ionotophoresis 4mg /ml Dexamethasone, Manual therapy, and Re-evaluation   PLAN FOR NEXT SESSION: Progress early shoulder ROM, strengthening in accordance with [facility] and [facility] Reverse Total Shoulder Arthoplasty Protocol   [facility], PT, DPT 11/28/21 10:45 AM

## 2021-12-05 ENCOUNTER — Ambulatory Visit: Payer: Medicare Other | Attending: Orthopedic Surgery

## 2021-12-05 DIAGNOSIS — M6281 Muscle weakness (generalized): Secondary | ICD-10-CM | POA: Insufficient documentation

## 2021-12-05 DIAGNOSIS — M25512 Pain in left shoulder: Secondary | ICD-10-CM | POA: Insufficient documentation

## 2021-12-05 DIAGNOSIS — G8929 Other chronic pain: Secondary | ICD-10-CM | POA: Diagnosis not present

## 2021-12-05 DIAGNOSIS — M25612 Stiffness of left shoulder, not elsewhere classified: Secondary | ICD-10-CM | POA: Diagnosis not present

## 2021-12-05 DIAGNOSIS — R600 Localized edema: Secondary | ICD-10-CM | POA: Insufficient documentation

## 2021-12-05 NOTE — Therapy (Signed)
OUTPATIENT PHYSICAL THERAPY TREATMENT NOTE/ PROGRESS NOTE    Patient Name: Victor Ramirez MRN: [id number] DOB:1955/06/23, 66 y.o., male Today's Date: 12/05/2021  PCP: Wilfrid Lund, [place]   REFERRING PROVIDER: Cammy Copa, MD   PT End of Session - 12/05/21 0920     Visit Number 14    Number of Visits 17    Date for PT Re-Evaluation 12/12/21    Authorization Type BCBS    Authorization Time Period FOTO v6, v10    Progress Note Due on Visit 10    PT Start Time 0920    PT Stop Time 0958    PT Time Calculation (min) 38 min    Activity Tolerance Patient tolerated treatment well    Behavior During Therapy WFL for tasks assessed/performed                        Past Medical History:  Diagnosis Date   Arthritis    Hemochromatosis    Hypertension    Past Surgical History:  Procedure Laterality Date   ANTERIOR LATERAL LUMBAR FUSION WITH PERCUTANEOUS SCREW 1 LEVEL Right 11/27/2016   Procedure: Lumbar Three-Four Transpsoas Lumbar InterbodyFfusion;  Surgeon: Ditty, Loura Halt, MD;  Location: [facility] OR;  Service: Neurosurgery;  Laterality: Right;  L3-4 Transpsoas lumbar interbody fusion/L3-4 Pedicle screw fixation with posterolateral arthrodesis/minimally invasive decompression/[place]or   APPLICATION OF ROBOTIC ASSISTANCE FOR SPINAL PROCEDURE N/A 11/27/2016   Procedure: Lumbar Three-Four Pedicle Screw Fixation with Posterolateral Arthrodesis with Minimally Invasive Decompression with Application of Robotic Assistance;  Surgeon: Ditty, Loura Halt, MD;  Location: [facility] OR;  Service: Neurosurgery;  Laterality: N/A;   BACK SURGERY     x3    BACK SURGERY     battery surgery    REVERSE SHOULDER ARTHROPLASTY Left 09/16/2021   Procedure: LEFT REVERSE SHOULDER ARTHROPLASTY;  Surgeon: Cammy Copa, MD;  Location: [facility] OR;  Service: Orthopedics;  Laterality: Left;   spinal manipulation under anesthesia     TONSILLECTOMY     Patient Active Problem List   Diagnosis Date  Noted   Vitamin D deficiency 10/15/2021   Osteoporosis 10/15/2021   OA (osteoarthritis) of shoulder 09/16/2021   S/P reverse total shoulder arthroplasty, left 09/16/2021   Pain in right wrist 05/12/2021   RUQ abdominal pain    Acute pancreatitis without infection or necrosis 12/02/2020   Pain in left shoulder 11/28/2020   Rheumatoid factor positive 08/29/2020   Polymyalgia rheumatica (HCC) 08/08/2020   Polyarthralgia 08/08/2020   High risk medication use 08/08/2020   Lumbosacral spondylosis with radiculopathy 11/27/2016   Atrophy of muscle of right lower leg 02/04/2014   History of post-polio syndrome 02/02/2014   Pain of left lower extremity 02/02/2014   Chronic back pain 05/14/2013   HTN (hypertension) 05/14/2013   Headache 12/22/2011   Sleep apnea 11/17/2011   Elevation of level of transaminase or lactic acid dehydrogenase (LDH) 09/18/2010   Anxiety 09/08/2010   Tachycardia 09/08/2010   Hyperlipemia 03/01/2006   TIA (transient ischemic attack) 03/01/2006   Tobacco use disorder 03/01/2006   Depressive disorder 10/16/2005    THERAPY DIAG:  Chronic left shoulder pain  Decreased ROM of left shoulder  Muscle weakness (generalized)  Localized edema  REFERRING DIAG: W58.099 (ICD-10-CM) - History of arthroplasty of left shoulder  PERTINENT HISTORY: Left reverse shoulder arthroplasty with Dr. Rise Paganini on 09/16/2021, L1 fx from 09/05/2021 (first orthopedic appoint scheduled for 10/16/2021), HTN  PRECAUTIONS/RESTRICTIONS:    Shoulder: Active and passive  ROM as tolerated to pain tolerance (Utilizing [facility] and [facility] Reverse Total Shoulder Arthoplasty Protocol), avoid shoulder extension past neutral and combined shoulder adduction/IR x10-12 weeks  SUBJECTIVE: Pt reports no pain today, adding that he is feeling more and more independent with activity.   PAIN:  Are you having pain? Yes: NPRS scale: 0/10 Pain location: Lt shoulder Pain description: Achy,  sharp Aggravating factors: reaching overhead, shoulder rotation Relieving factors: ice, compression, pain medications  OBJECTIVE:  DIAGNOSTIC FINDINGS:  10/01/2021: XR Shoulder Left: AP axillary outlet radiographs left shoulder reviewed.  Reverse shoulder  prosthesis in good position alignment with no complicating features.  11/15/2021: MR Lumbar Spine WO Contrast: IMPRESSION: 1. L1 and L5 superior endplate fractures with mild height loss, new since 2019. Limited associated marrow edema, likely recent but nearly healed. 2. L2-3 progressive adjacent segment degeneration with compressive spinal stenosis and moderate left foraminal narrowing. 3. L3-S1 solid fusion.     PATIENT SURVEYS:  FOTO 32%, predicted 61% in 21 visits 10/28/2021: FOTO: 47% 11/11/2021: 53%   COGNITION:           Overall cognitive status: Within functional limits for tasks assessed                                  SENSATION: Not tested   POSTURE: Forward shoulders/ head   UPPER EXTREMITY ROM:    A/PROM Right eval Left eval Left 10/23/2021 Left 11/27/2021  Shoulder flexion   122p!/140 (springy end-feel) 150 155  Shoulder abduction   130p!/ 140 (springy end-feel) 170, p! At end range 175  Shoulder internal rotation   30/40p! 58 70  Shoulder external rotation   25/34p! 50 60, mild p!  (Blank rows = not tested)   UPPER EXTREMITY MMT:   MMT Right eval Left eval Left 11/11/2021  Shoulder flexion 5/5 3+/5p! 4+/5, minor pain  Shoulder extension 5/5 3/5p! 4+/5  Shoulder abduction 5/5 3+/5p! 4+/5, minor pain  Shoulder internal rotation 5/5 3+/5p! 4/5, minor pain  Shoulder external rotation 5/5 3+/5p! 4+/5  Middle trapezius       Lower trapezius       Latissimus dorsi       Elbow flexion 5/5 4/5p! 4+/5  Elbow extension 5/5 5/5   Grip strength (lbs) 101 98   (Blank rows = not tested)     PALPATION:  TTP along surgical incision on Lt shoulder  FUNCTIONAL TESTS:  11/11/2021: 5# dumbbell overhead lift  x1 with minor discomfort     HOME EXERCISE PROGRAM: Access Code: [email or web address] URL: [email or web address] Date: 10/10/2021 Prepared by: Carmelina Dane   Exercises - Shoulder Scaption AAROM with Dowel (Mirrored)  - 2 x daily - 7 x weekly - 3 sets - 10 reps - 5 seconds hold - Standing Shoulder External Rotation AAROM with Dowel  - 2 x daily - 7 x weekly - 3 sets - 10 reps - 5 seconds hold - Isometric shoulder flexion at wall with 50% force  - 1 x daily - 7 x weekly - 2 sets - 10 reps - 5 seconds hold - Isometric shoulder external rotation at wall with 50% force  - 1 x daily - 7 x weekly - 2 sets - 10 reps - 5 seconds hold - Isometric shoulder abduction at wall with 50% force  - 1 x daily - 7 x weekly - 2 sets - 10 reps - 5 seconds hold   [facility] Adult  PT Treatment:                                                DATE: 12/05/2021 Therapeutic Exercise: Standing biceps pull-downs with 17# cable at Free Motion machine 3x10 Standing low rows with 10# cables 3x10 Seated Biceps curls into underhand overhead shoulder press with 3# dumbbells 3x10 Seated Lt shoulder ER AAROM with dowel rod 2x10 with 5-sec holds at end range Seated alternating BIL shoulder scaption with 3# dumbbells 3x10 BIL Seated BIL serratus punch with 35# 3x12 Manual Therapy: N/A Neuromuscular re-ed: N/A Therapeutic Activity: N/A Modalities: N/A Self Care: N/A   [facility] Adult PT Treatment:                                                DATE: 11/28/2021 Therapeutic Exercise: Seated low rows with 20# 2x10 Seated high rows with 15# 2x10 Seated lat pull-downs with 15# 2x10 Seated shoulder rolls 2x10 forward Standing BIL shoulder ER with 3# cables 3x10 Standing Lt shoulder IR with 3# cable 3x10 Lt shoulder flexion AAROM with UE Ranger 2x10 with 5-sec hold at end range Lt shoulder abduction AAROM with UE Ranger 2x10 with 5-sec hold at end range Manual Therapy: N/A Neuromuscular re-ed: N/A Therapeutic  Activity: N/A Modalities: N/A Self Care: N/A   [facility] Adult PT Treatment:                                                DATE: 11/27/2021 Therapeutic Exercise: Rt sidelying Lt shoulder ER with 5# dumbbell 3x10 Lt sidelying Lt shoulder IR with 3# dumbbell 3x10 Seated low rows with 15# 2x10 Seated high rows with 15# 2x10 Seated lat pull-downs with 15# 2x10 Seated shoulder rolls 2x10 forward and backward Standing BIL shoulder scaption with 2# dumbbells with back to wall 2x10 Standing corner pec stretch x17min with light stretch Manual Therapy: N/A Neuromuscular re-ed: N/A Therapeutic Activity: N/A Modalities: N/A Self Care: N/A     ASSESSMENT:   CLINICAL IMPRESSION: Pt presents 11 weeks and 43days s/p Lt reverse shoulder arthroplasty with Dr. Marcene Duos on 09/16/2021. Pt is nearing readiness for discharge from PT as is able to manage more advanced RTC/ deltoid/ biceps exercises with no problem. Additionally, he demonstrated more than adequate functional shoulder range at this point. The pt will benefit from continued skilled PT to address his primary impairments and return to his prior level of function with less limitation.      OBJECTIVE IMPAIRMENTS decreased mobility, decreased ROM, decreased strength, hypomobility, increased edema, impaired flexibility, impaired UE functional use, improper body mechanics, postural dysfunction, and pain.    ACTIVITY LIMITATIONS carrying, lifting, sleeping, bathing, dressing, reach over head, and hygiene/grooming   PARTICIPATION LIMITATIONS: meal prep, cleaning, laundry, driving, shopping, community activity, and yard work   PERSONAL FACTORS 1-2 comorbidities: See medical hx  are also affecting patient's functional outcome.      GOALS: Goals reviewed with patient? Yes   SHORT TERM GOALS: Target date: 11/07/2021    Pt will report understanding and adherence to initial HEP in order to promote independence in the management  of primary  impairments. Baseline: HEP provided at eval 10/23/2021: ACHIEVED Goal status: ACHIEVED   2.  Pt will achieve Lt shoulder elevation AROM of 150 degrees in order to progress post-op protocol. Baseline: Flexion: 122d, abduction: 130d 10/23/2021: Flexion 150d, abduction: 170d Goal status: ACHIEVED     LONG TERM GOALS: Target date: 12/05/2021   Pt will achieve a FOTO score of 61% in order to demonstrate improved functional ability as it relates to his primary impairments. Baseline: 32% 10/28/2021: 47% 11/11/2021: 53% Goal status: IN PROGRESS   2.  Pt will achieve Lt shoulder elevation AROM of 165 degrees in order to reach into overhead cabinets with less limitation. Baseline: Flexion: 122d, abductio: 130d 10/23/2021: Flexion: 150d, abduction 170d 11/27/2021: Flexion: 155d, abduction 175d Goal status: IN PROGRESS   3.  Pt will achieve Lt shoulder IR and ER AROM of 60 degrees in order to get dressed with less limitation. Baseline: ER: 25d, IR: 30d 10/23/2021: ER: 50d, IR: 58d 11/23/2021: ER: 60d, IR: 70d Goal status: ACHIEVED   4.  Pt will achieve global Lt shoulder MMT of 4+/5 or greater in order to progress his independent strengthening regimen with less limitation. Baseline: See MMT chart 11/11/2021: 4+/5 globally except for Lt shoulder IR MMT Goal status: IN PROGRESS   5.  Pt will demonstrate ability to lift 10# overhead with Lt UE with 0-3/10 pain in order to put away storage items into his attic with less limitation. Baseline: Unable to lift with Lt UE due to post-op status 11/11/2021: 5# overhead lift with minor discomfort Goal status: IN PROGRESS       PLAN: PT FREQUENCY: 2x/week   PT DURATION: 8 weeks   PLANNED INTERVENTIONS: Therapeutic exercises, Therapeutic activity, Neuromuscular re-education, Patient/Family education, Self Care, Joint mobilization, Aquatic Therapy, Dry Needling, Electrical stimulation, Spinal manipulation, Spinal mobilization, Cryotherapy, Moist heat, scar  mobilization, Taping, Vasopneumatic device, Biofeedback, Ionotophoresis 4mg /ml Dexamethasone, Manual therapy, and Re-evaluation   PLAN FOR NEXT SESSION: Progress early shoulder ROM, strengthening in accordance with [facility] and [facility] Reverse Total Shoulder Arthoplasty Protocol   [facility], PT, DPT 12/05/21 9:58 AM

## 2021-12-09 DIAGNOSIS — M5136 Other intervertebral disc degeneration, lumbar region: Secondary | ICD-10-CM | POA: Diagnosis not present

## 2021-12-09 DIAGNOSIS — M5416 Radiculopathy, lumbar region: Secondary | ICD-10-CM | POA: Diagnosis not present

## 2021-12-10 ENCOUNTER — Ambulatory Visit: Payer: Medicare Other

## 2021-12-10 DIAGNOSIS — M25612 Stiffness of left shoulder, not elsewhere classified: Secondary | ICD-10-CM

## 2021-12-10 DIAGNOSIS — M6281 Muscle weakness (generalized): Secondary | ICD-10-CM

## 2021-12-10 DIAGNOSIS — R600 Localized edema: Secondary | ICD-10-CM

## 2021-12-10 DIAGNOSIS — G8929 Other chronic pain: Secondary | ICD-10-CM | POA: Diagnosis not present

## 2021-12-10 DIAGNOSIS — M25512 Pain in left shoulder: Secondary | ICD-10-CM | POA: Diagnosis not present

## 2021-12-10 NOTE — Therapy (Signed)
OUTPATIENT PHYSICAL THERAPY TREATMENT NOTE/ DISCHARGE SUMMARY    Patient Name: Victor Ramirez MRN: [id number] DOB:05-Dec-1955, 66 y.o., male Today's Date: 12/10/2021  PCP: Lois Huxley, [place]   REFERRING PROVIDER: Meredith Pel, MD   PT End of Session - 12/10/21 1219     Visit Number 15    Number of Visits 17    Date for PT Re-Evaluation 12/12/21    Authorization Type BCBS    Authorization Time Period FOTO v6, v10    PT Start Time 1219    PT Stop Time 1257    PT Time Calculation (min) 38 min    Activity Tolerance Patient tolerated treatment well    Behavior During Therapy WFL for tasks assessed/performed                         Past Medical History:  Diagnosis Date   Arthritis    Hemochromatosis    Hypertension    Past Surgical History:  Procedure Laterality Date   ANTERIOR LATERAL LUMBAR FUSION WITH PERCUTANEOUS SCREW 1 LEVEL Right 11/27/2016   Procedure: Lumbar Three-Four Transpsoas Lumbar InterbodyFfusion;  Surgeon: Ditty, Kevan Ny, MD;  Location: [place];  Service: Neurosurgery;  Laterality: Right;  L3-4 Transpsoas lumbar interbody fusion/L3-4 Pedicle screw fixation with posterolateral arthrodesis/minimally invasive decompression/Mazor   APPLICATION OF ROBOTIC ASSISTANCE FOR SPINAL PROCEDURE N/A 11/27/2016   Procedure: Lumbar Three-Four Pedicle Screw Fixation with Posterolateral Arthrodesis with Minimally Invasive Decompression with Application of Robotic Assistance;  Surgeon: Ditty, Kevan Ny, MD;  Location: [place];  Service: Neurosurgery;  Laterality: N/A;   BACK SURGERY     x3    BACK SURGERY     battery surgery    REVERSE SHOULDER ARTHROPLASTY Left 09/16/2021   Procedure: LEFT REVERSE SHOULDER ARTHROPLASTY;  Surgeon: Meredith Pel, MD;  Location: [place];  Service: Orthopedics;  Laterality: Left;   spinal manipulation under anesthesia     TONSILLECTOMY     Patient Active Problem List   Diagnosis Date Noted   Vitamin D  deficiency 10/15/2021   Osteoporosis 10/15/2021   OA (osteoarthritis) of shoulder 09/16/2021   S/P reverse total shoulder arthroplasty, left 09/16/2021   Pain in right wrist 05/12/2021   RUQ abdominal pain    Acute pancreatitis without infection or necrosis 12/02/2020   Pain in left shoulder 11/28/2020   Rheumatoid factor positive 08/29/2020   Polymyalgia rheumatica ([place]) 08/08/2020   Polyarthralgia 08/08/2020   High risk medication use 08/08/2020   Lumbosacral spondylosis with radiculopathy 11/27/2016   Atrophy of muscle of right lower leg 02/04/2014   History of post-polio syndrome 02/02/2014   Pain of left lower extremity 02/02/2014   Chronic back pain 05/14/2013   HTN (hypertension) 05/14/2013   Headache 12/22/2011   Sleep apnea 11/17/2011   Elevation of level of transaminase or lactic acid dehydrogenase (LDH) 09/18/2010   Anxiety 09/08/2010   Tachycardia 09/08/2010   Hyperlipemia 03/01/2006   TIA (transient ischemic attack) 03/01/2006   Tobacco use disorder 03/01/2006   Depressive disorder 10/16/2005    THERAPY DIAG:  Chronic left shoulder pain  Decreased ROM of left shoulder  Muscle weakness (generalized)  Localized edema  REFERRING DIAG: Y40.347 (ICD-10-CM) - History of arthroplasty of left shoulder  PERTINENT HISTORY: Left reverse shoulder arthroplasty with Dr. Marcene Duos on 09/16/2021, L1 fx from 09/05/2021 (first orthopedic appoint scheduled for 10/16/2021), HTN  PRECAUTIONS/RESTRICTIONS:    Shoulder: Active and passive ROM as tolerated to pain tolerance (Utilizing [employer]  and [facility] Reverse Total Shoulder Arthoplasty Protocol), avoid shoulder extension past neutral and combined shoulder adduction/IR x10-12 weeks  SUBJECTIVE: Pt reports he has his final follow-up with ortho regarding his shoulder next week. He reports continued improvement with his shoulder impairments and is ready to be discharged at this time. He reports his primary concern moving  forward is his back, for which he received an epidural injection yesterday.  PAIN:  Are you having pain? Yes: NPRS scale: 0/10 Pain location: Lt shoulder Pain description: Achy, sharp Aggravating factors: reaching overhead, shoulder rotation Relieving factors: ice, compression, pain medications  OBJECTIVE:  DIAGNOSTIC FINDINGS:  10/01/2021: XR Shoulder Left: AP axillary outlet radiographs left shoulder reviewed.  Reverse shoulder  prosthesis in good position alignment with no complicating features.  11/15/2021: MR Lumbar Spine WO Contrast: IMPRESSION: 1. L1 and L5 superior endplate fractures with mild height loss, new since 2019. Limited associated marrow edema, likely recent but nearly healed. 2. L2-3 progressive adjacent segment degeneration with compressive spinal stenosis and moderate left foraminal narrowing. 3. L3-S1 solid fusion.     PATIENT SURVEYS:  FOTO 32%, predicted 61% in 21 visits 10/28/2021: FOTO: 47% 11/11/2021: 53%   12/10/2021: 71%   COGNITION:           Overall cognitive status: Within functional limits for tasks assessed                                  SENSATION: Not tested   POSTURE: Forward shoulders/ head   UPPER EXTREMITY ROM:    A/PROM Right eval Left eval Left 10/23/2021 Left 11/27/2021 Left 12/10/2021  Shoulder flexion   122p!/140 (springy end-feel) 150 155 165  Shoulder abduction   130p!/ 140 (springy end-feel) 170, p! At end range 175 180  Shoulder internal rotation   30/40p! 58 70 70  Shoulder external rotation   25/34p! 50 60, mild p! 75  (Blank rows = not tested)   UPPER EXTREMITY MMT:   MMT Right eval Left eval Left 11/11/2021 Left 12/10/2021  Shoulder flexion 5/5 3+/5p! 4+/5, minor pain 5/5  Shoulder extension 5/5 3/5p! 4+/5 5/5  Shoulder abduction 5/5 3+/5p! 4+/5, minor pain 5/5  Shoulder internal rotation 5/5 3+/5p! 4/5, minor pain 5/5  Shoulder external rotation 5/5 3+/5p! 4+/5 5/5  Middle trapezius        Lower  trapezius        Latissimus dorsi        Elbow flexion 5/5 4/5p! 4+/5 5/5  Elbow extension 5/5 5/5    Grip strength (lbs) 101 98    (Blank rows = not tested)     PALPATION:  TTP along surgical incision on Lt shoulder  FUNCTIONAL TESTS:  11/11/2021: 5# dumbbell overhead lift x1 with minor discomfort  12/10/2021: 10# dumbbell overhead lift with Lt UE: Achieved with no pain     HOME EXERCISE PROGRAM: Access Code: [email or web address] URL: [email or web address] Date: 10/10/2021 Prepared by: Vanessa [place]   Exercises - Shoulder Scaption AAROM with Dowel (Mirrored)  - 2 x daily - 7 x weekly - 3 sets - 10 reps - 5 seconds hold - Standing Shoulder External Rotation AAROM with Dowel  - 2 x daily - 7 x weekly - 3 sets - 10 reps - 5 seconds hold - Isometric shoulder flexion at wall with 50% force  - 1 x daily - 7 x weekly - 2 sets - 10 reps - 5 seconds hold -  Isometric shoulder external rotation at wall with 50% force  - 1 x daily - 7 x weekly - 2 sets - 10 reps - 5 seconds hold - Isometric shoulder abduction at wall with 50% force  - 1 x daily - 7 x weekly - 2 sets - 10 reps - 5 seconds hold  Added 12/10/2021: - Shoulder External Rotation and Scapular Retraction with Resistance  - 1 x daily - 7 x weekly - 3 sets - 10 reps - 3-sec hold - Standing Shoulder Row with Anchored Resistance  - 1 x daily - 7 x weekly - 3 sets - 10 reps - 3-sec hold - Shoulder extension with resistance - Neutral  - 1 x daily - 7 x weekly - 3 sets - 10 reps - 3-sec hold - Scaption with Dumbbells  - 1 x daily - 7 x weekly - 3 sets - 10 reps   [facility] Adult PT Treatment:                                                DATE: 12/10/2021 Therapeutic Exercise: Standing BIL shoulder ER with GTB 3x10 with 3-sec hold Standing low rows with blue TB 3x10 with 3-sec hold BIL shoulder scaption lifts with 4# dumbbells 2x10 with back against wall BIL shoulder extension with RTB 2x10 with 3-sec hold Manual  Therapy: N/A Neuromuscular re-ed: N/A Therapeutic Activity: Re-assessment of objective measures and rehab goals with pt education Re-administration of FOTO with pt education Update to HEP with pt education Modalities: N/A Self Care: N/A   [facility] Adult PT Treatment:                                                DATE: 12/05/2021 Therapeutic Exercise: Standing biceps pull-downs with 17# cable at Free Motion machine 3x10 Standing low rows with 10# cables 3x10 Seated Biceps curls into underhand overhead shoulder press with 3# dumbbells 3x10 Seated Lt shoulder ER AAROM with dowel rod 2x10 with 5-sec holds at end range Seated alternating BIL shoulder scaption with 3# dumbbells 3x10 BIL Seated BIL serratus punch with 35# 3x12 Manual Therapy: N/A Neuromuscular re-ed: N/A Therapeutic Activity: N/A Modalities: N/A Self Care: N/A   [facility] Adult PT Treatment:                                                DATE: 11/28/2021 Therapeutic Exercise: Seated low rows with 20# 2x10 Seated high rows with 15# 2x10 Seated lat pull-downs with 15# 2x10 Seated shoulder rolls 2x10 forward Standing BIL shoulder ER with 3# cables 3x10 Standing Lt shoulder IR with 3# cable 3x10 Lt shoulder flexion AAROM with UE Ranger 2x10 with 5-sec hold at end range Lt shoulder abduction AAROM with UE Ranger 2x10 with 5-sec hold at end range Manual Therapy: N/A Neuromuscular re-ed: N/A Therapeutic Activity: N/A Modalities: N/A Self Care: N/A     ASSESSMENT:   CLINICAL IMPRESSION: Pt presents 12 weeks and 1 day s/p Lt reverse shoulder arthroplasty with Dr. Marcene Duos on 09/16/2021. Upon re-assessment of objective measures, the pt has met all of his rehab  goals, making excellent improvements in FOTO score, global Lt shoulder strength, pain levels, and global Lt shoulder ROM. He has exceeded expectations in his rehab and is safe for discharge at this time. He tolerated exercises performed today well, and  these exercises were added to his HEP. Pt is discharged from PT at this time to continue to progress his independent HEP.      OBJECTIVE IMPAIRMENTS decreased mobility, decreased ROM, decreased strength, hypomobility, increased edema, impaired flexibility, impaired UE functional use, improper body mechanics, postural dysfunction, and pain.    ACTIVITY LIMITATIONS carrying, lifting, sleeping, bathing, dressing, reach over head, and hygiene/grooming   PARTICIPATION LIMITATIONS: meal prep, cleaning, laundry, driving, shopping, community activity, and yard work   PERSONAL FACTORS 1-2 comorbidities: See medical hx  are also affecting patient's functional outcome.      GOALS: Goals reviewed with patient? Yes   SHORT TERM GOALS: Target date: 11/07/2021    Pt will report understanding and adherence to initial HEP in order to promote independence in the management of primary impairments. Baseline: HEP provided at eval 10/23/2021: ACHIEVED Goal status: ACHIEVED   2.  Pt will achieve Lt shoulder elevation AROM of 150 degrees in order to progress post-op protocol. Baseline: Flexion: 122d, abduction: 130d 10/23/2021: Flexion 150d, abduction: 170d Goal status: ACHIEVED     LONG TERM GOALS: Target date: 12/05/2021   Pt will achieve a FOTO score of 61% in order to demonstrate improved functional ability as it relates to his primary impairments. Baseline: 32% 10/28/2021: 47% 11/11/2021: 53% 12/10/2021: 71% Goal status: ACHIEVED   2.  Pt will achieve Lt shoulder elevation AROM of 165 degrees in order to reach into overhead cabinets with less limitation. Baseline: Flexion: 122d, abductio: 130d 10/23/2021: Flexion: 150d, abduction 170d 11/27/2021: Flexion: 155d, abduction 175d 12/10/2021: Flexion: 165d, abduction: 180d Goal status: ACHIEVED   3.  Pt will achieve Lt shoulder IR and ER AROM of 60 degrees in order to get dressed with less limitation. Baseline: ER: 25d, IR: 30d 10/23/2021: ER: 50d, IR:  58d 11/23/2021: ER: 60d, IR: 70d Goal status: ACHIEVED   4.  Pt will achieve global Lt shoulder MMT of 4+/5 or greater in order to progress his independent strengthening regimen with less limitation. Baseline: See MMT chart 11/11/2021: 4+/5 globally except for Lt shoulder IR MMT 12/10/2021: 5/5 globally Goal status: ACHIEVED   5.  Pt will demonstrate ability to lift 10# overhead with Lt UE with 0-3/10 pain in order to put away storage items into his attic with less limitation. Baseline: Unable to lift with Lt UE due to post-op status 11/11/2021: 5# overhead lift with minor discomfort 12/10/2021: 10# overhead lift with no pain Goal status: ACHIEVED       PLAN: PT FREQUENCY: 2x/week   PT DURATION: 8 weeks   PLANNED INTERVENTIONS: Therapeutic exercises, Therapeutic activity, Neuromuscular re-education, Patient/Family education, Self Care, Joint mobilization, Aquatic Therapy, Dry Needling, Electrical stimulation, Spinal manipulation, Spinal mobilization, Cryotherapy, Moist heat, scar mobilization, Taping, Vasopneumatic device, Biofeedback, Ionotophoresis 61m/ml Dexamethasone, Manual therapy, and Re-evaluation   PLAN FOR NEXT SESSION: Pt is discharged from PT at this time   YVanessa [facility] [place] PT, DPT 12/10/21 12:57 PM

## 2021-12-17 ENCOUNTER — Encounter: Payer: Medicare Other | Admitting: Orthopedic Surgery

## 2021-12-24 ENCOUNTER — Ambulatory Visit (INDEPENDENT_AMBULATORY_CARE_PROVIDER_SITE_OTHER): Payer: Medicare Other | Admitting: Orthopedic Surgery

## 2021-12-24 DIAGNOSIS — Z96612 Presence of left artificial shoulder joint: Secondary | ICD-10-CM

## 2021-12-26 DIAGNOSIS — G8929 Other chronic pain: Secondary | ICD-10-CM | POA: Diagnosis not present

## 2021-12-26 DIAGNOSIS — F320 Major depressive disorder, single episode, mild: Secondary | ICD-10-CM | POA: Diagnosis not present

## 2021-12-27 ENCOUNTER — Encounter: Payer: Self-pay | Admitting: Orthopedic Surgery

## 2021-12-27 NOTE — Progress Notes (Signed)
Post-Op Visit Note   Patient: Victor Ramirez           Date of Birth: Jul 21, 1955           MRN: [id number] Visit Date: 12/24/2021 PCP: Lois Huxley, [place]   Assessment & Plan:  Chief Complaint:  Chief Complaint  Patient presents with   Left Shoulder - Routine Post Op     09/16/21 (74m 7d) Left Reverse Shoulder Arthroplasty     Visit Diagnoses:  1. History of arthroplasty of left shoulder     Plan: [name] is a patient underwent left reverse shoulder replacement 1723.  He is doing well with no problems.  He has been released from physical therapy.  Having little bit of low back pain.  Has had multiple surgeries on his lumbar spine.  On examination he has forward flexion active to 160 abduction greater than 90 as well as external rotation of 40.  Subscap strength intact.  Plan at this time is release and follow-up as needed.  Counseled him as to the need for antibiotic prophylaxis before any type of procedures within the first year after surgery.  Follow-up with [place] as needed.  Follow-Up Instructions: No follow-ups on file.   Orders:  No orders of the defined types were placed in this encounter.  No orders of the defined types were placed in this encounter.   Imaging: No results found.  PMFS History: Patient Active Problem List   Diagnosis Date Noted   Vitamin D deficiency 10/15/2021   Osteoporosis 10/15/2021   OA (osteoarthritis) of shoulder 09/16/2021   S/P reverse total shoulder arthroplasty, left 09/16/2021   Pain in right wrist 05/12/2021   RUQ abdominal pain    Acute pancreatitis without infection or necrosis 12/02/2020   Pain in left shoulder 11/28/2020   Rheumatoid factor positive 08/29/2020   Polymyalgia rheumatica ([place]) 08/08/2020   Polyarthralgia 08/08/2020   High risk medication use 08/08/2020   Lumbosacral spondylosis with radiculopathy 11/27/2016   Atrophy of muscle of right lower leg 02/04/2014   History of post-polio syndrome 02/02/2014   Pain of  left lower extremity 02/02/2014   Chronic back pain 05/14/2013   HTN (hypertension) 05/14/2013   Headache 12/22/2011   Sleep apnea 11/17/2011   Elevation of level of transaminase or lactic acid dehydrogenase (LDH) 09/18/2010   Anxiety 09/08/2010   Tachycardia 09/08/2010   Hyperlipemia 03/01/2006   TIA (transient ischemic attack) 03/01/2006   Tobacco use disorder 03/01/2006   Depressive disorder 10/16/2005   Past Medical History:  Diagnosis Date   Arthritis    Hemochromatosis    Hypertension     Family History  Problem Relation Age of Onset   Arthritis Mother    Thyroid cancer Mother    Melanoma Father    Fibromyalgia Sister    Cancer Brother    Prostate cancer Brother    Williams syndrome Son     Past Surgical History:  Procedure Laterality Date   ANTERIOR LATERAL LUMBAR FUSION WITH PERCUTANEOUS SCREW 1 LEVEL Right 11/27/2016   Procedure: Lumbar Three-Four Transpsoas Lumbar InterbodyFfusion;  Surgeon: Ditty, Kevan Ny, MD;  Location: [place];  Service: Neurosurgery;  Laterality: Right;  L3-4 Transpsoas lumbar interbody fusion/L3-4 Pedicle screw fixation with posterolateral arthrodesis/minimally invasive decompression/Mazor   APPLICATION OF ROBOTIC ASSISTANCE FOR SPINAL PROCEDURE N/A 11/27/2016   Procedure: Lumbar Three-Four Pedicle Screw Fixation with Posterolateral Arthrodesis with Minimally Invasive Decompression with Application of Robotic Assistance;  Surgeon: Ditty, Kevan Ny, MD;  Location: [place];  Service: Neurosurgery;  Laterality: N/A;   BACK SURGERY     x3    BACK SURGERY     battery surgery    REVERSE SHOULDER ARTHROPLASTY Left 09/16/2021   Procedure: LEFT REVERSE SHOULDER ARTHROPLASTY;  Surgeon: Cammy Copa, MD;  Location: [facility];  Service: Orthopedics;  Laterality: Left;   spinal manipulation under anesthesia     TONSILLECTOMY     Social History   Occupational History   Not on file  Tobacco Use   Smoking status: Former    Packs/day: 2.00     Years: 35.00    Total pack years: 70.00    Types: Cigarettes    Quit date: 2007    Years since quitting: 16.8   Smokeless tobacco: Never  Vaping Use   Vaping Use: Never used  Substance and Sexual Activity   Alcohol use: Yes    Alcohol/week: 7.0 - 10.0 standard drinks of alcohol    Types: 7 - 10 Cans of beer per week   Drug use: No   Sexual activity: Not on file

## 2022-01-01 ENCOUNTER — Ambulatory Visit: Payer: Medicare Other

## 2022-01-12 ENCOUNTER — Other Ambulatory Visit: Payer: Self-pay | Admitting: Internal Medicine

## 2022-01-12 DIAGNOSIS — M353 Polymyalgia rheumatica: Secondary | ICD-10-CM

## 2022-01-12 NOTE — Telephone Encounter (Signed)
Next Visit: 01/27/2022  Last Visit: 10/15/2021  Last Fill: 10/10/2021  Dx: Polymyalgia rheumatica   Current Dose per office note on 10/15/2021: Prednisone 1 TABLET(5 MG) BY MOUTH DAILY WITH BREAKFAST.    Okay to refill Prednisone?

## 2022-01-26 NOTE — Progress Notes (Signed)
Office Visit Note  Patient: Victor Ramirez             Date of Birth: [birth year]           MRN: [id number]             PCP: Wilfrid LundBecker, Anna G, PA Referring: Wilfrid LundBecker, Anna G, [place] Visit Date: 01/27/2022   Subjective:  Follow-up (Doing good)   History of Present Illness: Victor MastersDavid C Lengyel is a 66 y.o. male here for follow up for PMR with multiple joint pains on low dose prednisone 5 mg daily. He had lumbar spine injection last month with Dr. Conchita ParisNundkumar with very good relief of his back pain, currently sleeping much better than before and feels well.   Previous HPI 10/15/21 Victor MastersDavid C Towell is a 66 y.o. male here for follow up or PMR with multiple joint pains. He is having a good benefit in symptoms when taking the prednisone but does not do well with any further dose reduction. Reverse shoulder arthroplasty. He has had some shoulder symptom improvement with PT so far. He sustained a fall while forward bending at home and landed on his buttocks then felt severe low back pain. He went for evaluation and xray questionable for new changes. He has f/u scheduled 9/24 with Dr. Conchita ParisNundkumar.     Previous HPI 05/12/2021 Victor MastersDavid C Mcgilvery is a 66 y.o. male here for follow up for PMR with multiple joint pains on low dose prednisone 5 mg daily. He completed a series of physical therapy for chronic joint pains especially shoulders but concerned about advanced structural problems possibly to need further imaging or procedures. He continues taking the prednisone 5 mg daily without major complications but is having worse symptoms. Right wrist pain at the ulnar side is frequent and right hand numbness intermittently particularly when driving or first thing in the morning. He has numerous forearm scratches and bleeding areas from a new puppy. The left shoulder hurt worse with PT sessions although his ROM improved very slightly. He has an appointment with Dr. August Saucerean later today about the shoulder which has failed to improved  with steroid injections, NSAIDs, prednisone, and PT.   Previous HPI 01/29/21 Victor MastersDavid C Hawe is a 66 y.o. male here for follow up for PMR on prednisone 7.5 mg daily. Since our last visit he was at the hospital last month with abdominal pain workup thought to represent pancreatitis. Symptoms are overall doing better in most areas except right hand, left shoulder, and neck. Left shoulder steroid injection last visit helped for about a month but then had worsening again. His right hand is hurting more in the wrist and hand going numb intermittently. Especially notices this during prolonged driving, and improves keeping wrist in neutral position.   Previous HPI: 08/08/20 Victor MastersDavid C Mcquain is a 66 y.o. male referred for PMR. His symptoms started abruptly with severe pain in bilateral shoulders limiting use and mobility especially first thing in the morning.  He does not require any preceding injury or changes in health or medications. For evaluation and primary care clinic with x-ray imaging obtained that did not show any structural cause of symptoms he had markedly high inflammatory markers and was started on oral prednisone for suspected PMR.  He noticed dramatic improvement in symptoms within 2 days of starting the medicine.  At follow-up he was recommended to decrease the dose down to 15 mg of prednisone at which time he noticed a return of his symptoms.  He increased  back to 20 mg and feels this is doing well again.  The most affected areas were in the base of his neck and bilateral upper back and shoulders with radiation down the upper portion of the arms.  He also felt like it was affecting his right hip and thigh much worse than left, where he has chronic deficits due to history of polio.  He has started to notice some weight gain with the oral prednisone otherwise no specific side effect problems.   Review of Systems  Constitutional:  Positive for fatigue.  HENT:  Negative for mouth sores and mouth  dryness.   Eyes:  Negative for dryness.  Respiratory:  Negative for shortness of breath.   Cardiovascular:  Positive for palpitations. Negative for chest pain.  Gastrointestinal:  Positive for constipation. Negative for blood in stool and diarrhea.  Endocrine: Negative for increased urination.  Genitourinary:  Negative for involuntary urination.  Musculoskeletal:  Positive for joint pain, gait problem, joint pain, myalgias, muscle weakness, morning stiffness and myalgias. Negative for joint swelling and muscle tenderness.  Skin:  Negative for color change, rash, hair loss and sensitivity to sunlight.  Allergic/Immunologic: Negative for susceptible to infections.  Neurological:  Negative for dizziness and headaches.  Hematological:  Negative for swollen glands.  Psychiatric/Behavioral:  Positive for sleep disturbance. Negative for depressed mood. The patient is nervous/anxious.     PMFS History:  Patient Active Problem List   Diagnosis Date Noted   Vitamin D deficiency 10/15/2021   Osteoporosis 10/15/2021   OA (osteoarthritis) of shoulder 09/16/2021   S/P reverse total shoulder arthroplasty, left 09/16/2021   Pain in right wrist 05/12/2021   RUQ abdominal pain    Acute pancreatitis without infection or necrosis 12/02/2020   Pain in left shoulder 11/28/2020   Rheumatoid factor positive 08/29/2020   Polymyalgia rheumatica (HCC) 08/08/2020   Polyarthralgia 08/08/2020   High risk medication use 08/08/2020   Lumbosacral spondylosis with radiculopathy 11/27/2016   Atrophy of muscle of right lower leg 02/04/2014   History of post-polio syndrome 02/02/2014   Pain of left lower extremity 02/02/2014   Chronic back pain 05/14/2013   HTN (hypertension) 05/14/2013   Headache 12/22/2011   Sleep apnea 11/17/2011   Elevation of level of transaminase or lactic acid dehydrogenase (LDH) 09/18/2010   Anxiety 09/08/2010   Tachycardia 09/08/2010   Hyperlipemia 03/01/2006   TIA (transient ischemic  attack) 03/01/2006   Tobacco use disorder 03/01/2006   Depressive disorder 10/16/2005    Past Medical History:  Diagnosis Date   Arthritis    Hemochromatosis    Hypertension     Family History  Problem Relation Age of Onset   Arthritis Mother    Thyroid cancer Mother    Melanoma Father    Fibromyalgia Sister    Cancer Brother    Prostate cancer Brother    Williams syndrome Son    Past Surgical History:  Procedure Laterality Date   ANTERIOR LATERAL LUMBAR FUSION WITH PERCUTANEOUS SCREW 1 LEVEL Right 11/27/2016   Procedure: Lumbar Three-Four Transpsoas Lumbar InterbodyFfusion;  Surgeon: Ditty, Loura Halt, MD;  Location: [facility] OR;  Service: Neurosurgery;  Laterality: Right;  L3-4 Transpsoas lumbar interbody fusion/L3-4 Pedicle screw fixation with posterolateral arthrodesis/minimally invasive decompression/Mazor   APPLICATION OF ROBOTIC ASSISTANCE FOR SPINAL PROCEDURE N/A 11/27/2016   Procedure: Lumbar Three-Four Pedicle Screw Fixation with Posterolateral Arthrodesis with Minimally Invasive Decompression with Application of Robotic Assistance;  Surgeon: Ditty, Loura Halt, MD;  Location: [facility] OR;  Service: Neurosurgery;  Laterality:  N/A;   BACK SURGERY     x3    BACK SURGERY     battery surgery    REVERSE SHOULDER ARTHROPLASTY Left 09/16/2021   Procedure: LEFT REVERSE SHOULDER ARTHROPLASTY;  Surgeon: Cammy Copa, MD;  Location: [facility] OR;  Service: Orthopedics;  Laterality: Left;   spinal manipulation under anesthesia     TONSILLECTOMY     Social History   Social History Narrative   Not on file   Immunization History  Administered Date(s) Administered   PFIZER(Purple Top)SARS-COV-2 Vaccination 06/01/2019, 07/01/2019     Objective: Vital Signs: Resp 16   Ht 5\' 8"  (1.727 m)   Wt 179 lb (81.2 kg)   BMI 27.22 kg/m    Physical Exam Cardiovascular:     Rate and Rhythm: Normal rate and regular rhythm.  Pulmonary:     Effort: Pulmonary effort is normal.     Breath  sounds: Normal breath sounds.  Musculoskeletal:     Right lower leg: No edema.     Left lower leg: No edema.  Skin:    General: Skin is warm and dry.     Findings: No rash.  Neurological:     Mental Status: He is alert.  Psychiatric:        Mood and Affect: Mood normal.      Musculoskeletal Exam:  Shoulders full ROM no tenderness or swelling Elbows full ROM no tenderness or swelling Wrists full ROM no tenderness or swelling Fingers full ROM no tenderness or swelling Knees full ROM no tenderness or swelling   Investigation: No additional findings.  Imaging: No results found.  Recent Labs: Lab Results  Component Value Date   WBC 11.4 (H) 09/08/2021   HGB 14.8 09/08/2021   PLT 284 09/08/2021   NA 137 01/27/2022   K 5.2 01/27/2022   CL 100 01/27/2022   CO2 27 01/27/2022   GLUCOSE 99 01/27/2022   BUN 24 01/27/2022   CREATININE 1.48 (H) 01/27/2022   BILITOT 1.0 12/03/2020   ALKPHOS 71 12/03/2020   AST 33 12/03/2020   ALT 46 (H) 12/03/2020   PROT 7.0 12/03/2020   ALBUMIN 3.6 12/03/2020   CALCIUM 10.0 01/27/2022   GFRAA 111 08/08/2020    Speciality Comments: No specialty comments available.  Procedures:  No procedures performed Allergies: Bee venom   Assessment / Plan:     Visit Diagnoses: Polymyalgia rheumatica (HCC) - Plan: Sedimentation rate  Doing pretty well currently still getting some varying intensity of symptoms most commonly finds problems in the right leg whenever having some more inflammation.  The shoulder problem has remained a good bit better after his neck surgery.  Will recheck sedimentation rate for disease activity monitoring.  Unfortunately symptoms have gotten worse when trying to stop or reduce steroids further.  Plan to continue prednisone 5 mg daily.  Vitamin D deficiency - Plan: VITAMIN D 25 Hydroxy (Vit-D Deficiency, Fractures), BASIC METABOLIC PANEL WITH GFR  Checking current vitamin D level and basic metabolic panel for baseline  anticipating starting bisphosphonate treatment.  Osteoporosis with current pathological fracture with routine healing, unspecified osteoporosis type, subsequent encounter - Plan: VITAMIN D 25 Hydroxy (Vit-D Deficiency, Fractures), BASIC METABOLIC PANEL WITH GFR, alendronate (FOSAMAX) 70 MG tablet  Previously had bone density testing without osteoporotic range values however additional vertebral fracture with a minimal amount of following trauma suggest disease defining fracture.  Although his anatomy is slightly abnormal as this was 1 level above some areas of previous fusion.  However all  things considered with the ongoing long-term use of low-dose prednisone we will plan to start treatment with alendronate 70 mg weekly for osteoporosis and prevention of worsening due to glucocorticoid induced osteoporosis.  Orders: Orders Placed This Encounter  Procedures   Sedimentation rate   VITAMIN D 25 Hydroxy (Vit-D Deficiency, Fractures)   BASIC METABOLIC PANEL WITH GFR   Meds ordered this encounter  Medications   alendronate (FOSAMAX) 70 MG tablet    Sig: Take 1 tablet (70 mg total) by mouth once a week. Take with a full glass of water on an empty stomach.    Dispense:  13 tablet    Refill:  0     Follow-Up Instructions: Return in about 3 months (around 04/29/2022) for PMR/OP on GC/alendronate start f/u 24mos.   Fuller Plan, MD  Note - This record has been created using [employer].  Chart creation errors have been sought, but may not always  have been located. Such creation errors do not reflect on  the standard of medical care.

## 2022-01-27 ENCOUNTER — Ambulatory Visit: Payer: Medicare Other | Attending: Internal Medicine | Admitting: Internal Medicine

## 2022-01-27 ENCOUNTER — Encounter: Payer: Self-pay | Admitting: Internal Medicine

## 2022-01-27 VITALS — Resp 16 | Ht 68.0 in | Wt 179.0 lb

## 2022-01-27 DIAGNOSIS — E559 Vitamin D deficiency, unspecified: Secondary | ICD-10-CM

## 2022-01-27 DIAGNOSIS — M8000XD Age-related osteoporosis with current pathological fracture, unspecified site, subsequent encounter for fracture with routine healing: Secondary | ICD-10-CM

## 2022-01-27 DIAGNOSIS — M2550 Pain in unspecified joint: Secondary | ICD-10-CM

## 2022-01-27 DIAGNOSIS — M353 Polymyalgia rheumatica: Secondary | ICD-10-CM

## 2022-01-27 DIAGNOSIS — R768 Other specified abnormal immunological findings in serum: Secondary | ICD-10-CM

## 2022-01-27 DIAGNOSIS — Z79899 Other long term (current) drug therapy: Secondary | ICD-10-CM

## 2022-01-27 NOTE — Patient Instructions (Signed)
Alendronate Tablets What is this medication? ALENDRONATE (a LEN droe nate) prevents and treats osteoporosis. It may also be used to treat Paget disease of the bone. It works by making your bones stronger and less likely to break (fracture). It belongs to a group of medications called bisphosphonates. This medicine may be used for other purposes; ask your health care provider or pharmacist if you have questions. COMMON BRAND NAME(S): Fosamax What should I tell my care team before I take this medication? They need to know if you have any of these conditions: Bleeding disorder Cancer Dental disease Difficulty swallowing Infection (fever, chills, cough, sore throat, pain or trouble passing urine) Kidney disease Low levels of calcium or other minerals in the blood Low red blood cell counts Receiving steroids like dexamethasone or prednisone Stomach or intestine problems Trouble sitting or standing for 30 minutes An unusual or allergic reaction to alendronate, other medications, foods, dyes or preservatives Pregnant or trying to get pregnant Breast-feeding How should I use this medication? Take this medication by mouth with a full glass of water. Take it as directed on the prescription label at the same time every day. Take the dose right after waking up. Do not eat or drink anything before taking it. Do not take it with any other drink except water. Do not chew or crush the tablet. After taking it, do not eat breakfast, drink, or take any other medications or vitamins for at least 30 minutes. Sit or stand up for at least 30 minutes after you take it. Do not lie down. Keep taking it unless your care team tells you to stop. A special MedGuide will be given to you by the pharmacist with each prescription and refill. Be sure to read this information carefully each time. Talk to your care team about the use of this medication in children. Special care may be needed. Overdosage: If you think you have  taken too much of this medicine contact a poison control center or emergency room at once. NOTE: This medicine is only for you. Do not share this medicine with others. What if I miss a dose? If you take your medication once a day, skip it. Take your next dose at the scheduled time the next morning. Do not take two doses on the same day. If you take your medication once a week, take the missed dose on the morning after you remember. Do not take two doses on the same day. What may interact with this medication? Aluminum hydroxide Antacids Aspirin Calcium supplements Medications for inflammation like ibuprofen, naproxen, and others Iron supplements Magnesium supplements Vitamins with minerals This list may not describe all possible interactions. Give your health care provider a list of all the medicines, herbs, non-prescription drugs, or dietary supplements you use. Also tell them if you smoke, drink alcohol, or use illegal drugs. Some items may interact with your medicine. What should I watch for while using this medication? Visit your care team for regular checks on your progress. It may be some time before you see the benefit from this medication. Some people who take this medication have severe bone, joint, or muscle pain. This medication may also increase your risk for jaw problems or a broken thigh bone. Tell your care team right away if you have severe pain in your jaw, bones, joints, or muscles. Tell you care team if you have any pain that does not go away or that gets worse. Tell your dentist and dental surgeon that you are   taking this medication. You should not have major dental surgery while on this medication. See your dentist to have a dental exam and fix any dental problems before starting this medication. Take good care of your teeth while on this medication. Make sure you see your dentist for regular follow-up appointments. You should make sure you get enough calcium and vitamin D  while you are taking this medication. Discuss the foods you eat and the vitamins you take with your care team. You may need blood work done while you are taking this medication. What side effects may I notice from receiving this medication? Side effects that you should report to your care team as soon as possible: Allergic reactions--skin rash, itching, hives, swelling of the face, lips, tongue, or throat Low calcium level--muscle pain or cramps, confusion, tingling, or numbness in the hands or feet Osteonecrosis of the jaw--pain, swelling, or redness in the mouth, numbness of the jaw, poor healing after dental work, unusual discharge from the mouth, visible bones in the mouth Pain or trouble swallowing Severe bone, joint, or muscle pain Stomach bleeding--bloody or black, tar-like stools, vomiting blood or brown material that looks like coffee grounds Side effects that usually do not require medical attention (report to your care team if they continue or are bothersome): Constipation Diarrhea Nausea Stomach pain This list may not describe all possible side effects. Call your doctor for medical advice about side effects. You may report side effects to FDA at 1-800-FDA-1088. Where should I keep my medication? Keep out of the reach of children and pets. Store at room temperature between 15 and 30 degrees C (59 and 86 degrees F). Throw away any unused medication after the expiration date. NOTE: This sheet is a summary. It may not cover all possible information. If you have questions about this medicine, talk to your doctor, pharmacist, or health care provider.  2023 Elsevier/Gold Standard (2020-02-12 00:00:00)  

## 2022-01-28 LAB — BASIC METABOLIC PANEL WITH GFR
BUN/Creatinine Ratio: 16 (calc) (ref 6–22)
BUN: 24 mg/dL (ref 7–25)
CO2: 27 mmol/L (ref 20–32)
Calcium: 10 mg/dL (ref 8.6–10.3)
Chloride: 100 mmol/L (ref 98–110)
Creat: 1.48 mg/dL — ABNORMAL HIGH (ref 0.70–1.35)
Glucose, Bld: 99 mg/dL (ref 65–139)
Potassium: 5.2 mmol/L (ref 3.5–5.3)
Sodium: 137 mmol/L (ref 135–146)
eGFR: 53 mL/min/{1.73_m2} — ABNORMAL LOW (ref >=60)

## 2022-01-28 LAB — SEDIMENTATION RATE: Sed Rate: 25 mm/h — ABNORMAL HIGH (ref 0–20)

## 2022-01-28 LAB — VITAMIN D 25 HYDROXY (VIT D DEFICIENCY, FRACTURES): Vit D, 25-Hydroxy: 30 ng/mL (ref 30–100)

## 2022-01-30 MED ORDER — ALENDRONATE SODIUM 70 MG PO TABS: 70.0000 mg | ORAL_TABLET | ORAL | 0 refills | Status: DC

## 2022-01-30 NOTE — Progress Notes (Signed)
Lab results look fine for starting the Fosamax as planned.  His vitamin D level is 30 which is adequate.  Kidney function test is slightly worse compared to 3 to 4 months ago but is not so bad that it would require change in treatment plan.  We can just monitor this.  His sedimentation rate test remains very slightly elevated at 25 we can continue the 5 mg prednisone as planned.

## 2022-02-10 DIAGNOSIS — H2513 Age-related nuclear cataract, bilateral: Secondary | ICD-10-CM | POA: Diagnosis not present

## 2022-02-10 DIAGNOSIS — H52223 Regular astigmatism, bilateral: Secondary | ICD-10-CM | POA: Diagnosis not present

## 2022-02-10 DIAGNOSIS — H5203 Hypermetropia, bilateral: Secondary | ICD-10-CM | POA: Diagnosis not present

## 2022-02-10 DIAGNOSIS — H25013 Cortical age-related cataract, bilateral: Secondary | ICD-10-CM | POA: Diagnosis not present

## 2022-02-11 DIAGNOSIS — H524 Presbyopia: Secondary | ICD-10-CM | POA: Diagnosis not present

## 2022-03-12 DIAGNOSIS — M5416 Radiculopathy, lumbar region: Secondary | ICD-10-CM | POA: Diagnosis not present

## 2022-04-06 DIAGNOSIS — M5136 Other intervertebral disc degeneration, lumbar region: Secondary | ICD-10-CM | POA: Diagnosis not present

## 2022-04-15 ENCOUNTER — Other Ambulatory Visit: Payer: Self-pay | Admitting: Internal Medicine

## 2022-04-15 DIAGNOSIS — M353 Polymyalgia rheumatica: Secondary | ICD-10-CM

## 2022-04-15 NOTE — Telephone Encounter (Signed)
Next Visit: 04/30/2022  Last Visit: 01/27/2022  Last Fill:01/22/2022  Dx:  Polymyalgia rheumatica   Current Dose per office note on 01/27/2022: Plan to continue prednisone 5 mg daily.   Okay to refill Prednisone?

## 2022-04-29 NOTE — Progress Notes (Unsigned)
Office Visit Note  Patient: Victor Ramirez             Date of Birth: [birth year]           MRN: [id number]             PCP: Lois Huxley, PA Referring: Lois Huxley, [place] Visit Date: 04/30/2022   Subjective:  No chief complaint on file.   History of Present Illness: Victor Ramirez is a 67 y.o. male here for follow up ***   Previous HPI 01/27/22 ????d [name] is a 67 y.o. male here for follow up for PMR with multiple joint pains on low dose prednisone 5 mg daily. He had lumbar spine injection last month with Dr. Kathyrn Sheriff with very good relief of his back pain, currently sleeping much better than before and feels well.    Previous HPI 10/15/21 Victor Ramirez is a 67 y.o. male here for follow up or PMR with multiple joint pains. He is having a good benefit in symptoms when taking the prednisone but does not do well with any further dose reduction. Reverse shoulder arthroplasty. He has had some shoulder symptom improvement with PT so far. He sustained a fall while forward bending at home and landed on his buttocks then felt severe low back pain. He went for evaluation and xray questionable for new changes. He has f/u scheduled 9/24 with Dr. Kathyrn Sheriff.     Previous HPI 05/12/2021 Victor Ramirez is a 67 y.o. male here for follow up for PMR with multiple joint pains on low dose prednisone 5 mg daily. He completed a series of physical therapy for chronic joint pains especially shoulders but concerned about advanced structural problems possibly to need further imaging or procedures. He continues taking the prednisone 5 mg daily without major complications but is having worse symptoms. Right wrist pain at the ulnar side is frequent and right hand numbness intermittently particularly when driving or first thing in the morning. He has numerous forearm scratches and bleeding areas from a new puppy. The left shoulder hurt worse with PT sessions although his ROM improved very slightly. He has  an appointment with Dr. Marlou Sa later today about the shoulder which has failed to improved with steroid injections, NSAIDs, prednisone, and PT.   Previous HPI 01/29/21 [name] is a 67 y.o. male here for follow up for PMR on prednisone 7.5 mg daily. Since our last visit he was at the hospital last month with abdominal pain workup thought to represent pancreatitis. Symptoms are overall doing better in most areas except right hand, left shoulder, and neck. Left shoulder steroid injection last visit helped for about a month but then had worsening again. His right hand is hurting more in the wrist and hand going numb intermittently. Especially notices this during prolonged driving, and improves keeping wrist in neutral position.   Previous HPI: 08/08/20 Victor Ramirez is a 67 y.o. male referred for PMR. His symptoms started abruptly with severe pain in bilateral shoulders limiting use and mobility especially first thing in the morning.  He does not require any preceding injury or changes in health or medications. For evaluation and primary care clinic with x-ray imaging obtained that did not show any structural cause of symptoms he had markedly high inflammatory markers and was started on oral prednisone for suspected PMR.  He noticed dramatic improvement in symptoms within 2 days of starting the medicine.  At follow-up he was recommended to decrease  the dose down to 15 mg of prednisone at which time he noticed a return of his symptoms.  He increased back to 20 mg and feels this is doing well again.  The most affected areas were in the base of his neck and bilateral upper back and shoulders with radiation down the upper portion of the arms.  He also felt like it was affecting his right hip and thigh much worse than left, where he has chronic deficits due to history of polio.  He has started to notice some weight gain with the oral prednisone otherwise no specific side effect problems.    No Rheumatology  ROS completed.   PMFS History:  Patient Active Problem List   Diagnosis Date Noted   Vitamin D deficiency 10/15/2021   Osteoporosis 10/15/2021   OA (osteoarthritis) of shoulder 09/16/2021   S/P reverse total shoulder arthroplasty, left 09/16/2021   Pain in right wrist 05/12/2021   RUQ abdominal pain    Acute pancreatitis without infection or necrosis 12/02/2020   Pain in left shoulder 11/28/2020   Rheumatoid factor positive 08/29/2020   Polymyalgia rheumatica ([place]) 08/08/2020   Polyarthralgia 08/08/2020   High risk medication use 08/08/2020   Lumbosacral spondylosis with radiculopathy 11/27/2016   Atrophy of muscle of right lower leg 02/04/2014   History of post-polio syndrome 02/02/2014   Pain of left lower extremity 02/02/2014   Chronic back pain 05/14/2013   HTN (hypertension) 05/14/2013   Headache 12/22/2011   Sleep apnea 11/17/2011   Elevation of level of transaminase or lactic acid dehydrogenase (LDH) 09/18/2010   Anxiety 09/08/2010   Tachycardia 09/08/2010   Hyperlipemia 03/01/2006   TIA (transient ischemic attack) 03/01/2006   Tobacco use disorder 03/01/2006   Depressive disorder 10/16/2005    Past Medical History:  Diagnosis Date   Arthritis    Hemochromatosis    Hypertension     Family History  Problem Relation Age of Onset   Arthritis Mother    Thyroid cancer Mother    Melanoma Father    Fibromyalgia Sister    Cancer Brother    Prostate cancer Brother    Williams syndrome Son    Past Surgical History:  Procedure Laterality Date   ANTERIOR LATERAL LUMBAR FUSION WITH PERCUTANEOUS SCREW 1 LEVEL Right 11/27/2016   Procedure: Lumbar Three-Four Transpsoas Lumbar InterbodyFfusion;  Surgeon: Ditty, Kevan Ny, MD;  Location: [facility];  Service: Neurosurgery;  Laterality: Right;  L3-4 Transpsoas lumbar interbody fusion/L3-4 Pedicle screw fixation with posterolateral arthrodesis/minimally invasive decompression/Mazor   APPLICATION OF ROBOTIC ASSISTANCE FOR  SPINAL PROCEDURE N/A 11/27/2016   Procedure: Lumbar Three-Four Pedicle Screw Fixation with Posterolateral Arthrodesis with Minimally Invasive Decompression with Application of Robotic Assistance;  Surgeon: Ditty, Kevan Ny, MD;  Location: [facility];  Service: Neurosurgery;  Laterality: N/A;   BACK SURGERY     x3    BACK SURGERY     battery surgery    REVERSE SHOULDER ARTHROPLASTY Left 09/16/2021   Procedure: LEFT REVERSE SHOULDER ARTHROPLASTY;  Surgeon: Meredith Pel, MD;  Location: [facility];  Service: Orthopedics;  Laterality: Left;   spinal manipulation under anesthesia     TONSILLECTOMY     Social History   Social History Narrative   Not on file   Immunization History  Administered Date(s) Administered   PFIZER(Purple Top)SARS-COV-2 Vaccination 06/01/2019, 07/01/2019     Objective: Vital Signs: There were no vitals taken for this visit.   Physical Exam   Musculoskeletal Exam: ***  CDAI Exam: CDAI Score: --  Patient Global: --; Provider Global: -- Swollen: --; Tender: -- Joint Exam 04/30/2022   No joint exam has been documented for this visit   There is currently no information documented on the homunculus. Go to the Rheumatology activity and complete the homunculus joint exam.  Investigation: No additional findings.  Imaging: No results found.  Recent Labs: Lab Results  Component Value Date   WBC 11.4 (H) 09/08/2021   HGB 14.8 09/08/2021   PLT 284 09/08/2021   NA 137 01/27/2022   K 5.2 01/27/2022   CL 100 01/27/2022   CO2 27 01/27/2022   GLUCOSE 99 01/27/2022   BUN 24 01/27/2022   CREATININE 1.48 (H) 01/27/2022   BILITOT 1.0 12/03/2020   ALKPHOS 71 12/03/2020   AST 33 12/03/2020   ALT 46 (H) 12/03/2020   PROT 7.0 12/03/2020   ALBUMIN 3.6 12/03/2020   CALCIUM 10.0 01/27/2022   GFRAA 111 08/08/2020    Speciality Comments: No specialty comments available.  Procedures:  No procedures performed Allergies: Bee venom   Assessment / Plan:      Visit Diagnoses: No diagnosis found.  ***  Orders: No orders of the defined types were placed in this encounter.  No orders of the defined types were placed in this encounter.    Follow-Up Instructions: No follow-ups on file.   Collier Salina, MD  Note - This record has been created using [employer].  Chart creation errors have been sought, but may not always  have been located. Such creation errors do not reflect on  the standard of medical care.

## 2022-04-30 ENCOUNTER — Encounter: Payer: Self-pay | Admitting: Internal Medicine

## 2022-04-30 ENCOUNTER — Ambulatory Visit: Payer: Medicare Other | Attending: Internal Medicine | Admitting: Internal Medicine

## 2022-04-30 VITALS — BP 134/90 | HR 83 | Resp 16 | Ht 68.0 in | Wt 177.0 lb

## 2022-04-30 DIAGNOSIS — M8000XD Age-related osteoporosis with current pathological fracture, unspecified site, subsequent encounter for fracture with routine healing: Secondary | ICD-10-CM | POA: Diagnosis not present

## 2022-04-30 DIAGNOSIS — M353 Polymyalgia rheumatica: Secondary | ICD-10-CM | POA: Diagnosis not present

## 2022-04-30 DIAGNOSIS — R768 Other specified abnormal immunological findings in serum: Secondary | ICD-10-CM

## 2022-04-30 DIAGNOSIS — I10 Essential (primary) hypertension: Secondary | ICD-10-CM

## 2022-04-30 MED ORDER — PREDNISONE 5 MG PO TABS: ORAL_TABLET | ORAL | 1 refills | Status: DC

## 2022-05-01 LAB — CBC WITH DIFFERENTIAL/PLATELET
Absolute Monocytes: 1049 cells/uL — ABNORMAL HIGH (ref 200–950)
Basophils Absolute: 64 cells/uL (ref 0–200)
Basophils Relative: 0.7 %
Eosinophils Absolute: 101 cells/uL (ref 15–500)
Eosinophils Relative: 1.1 %
HCT: 43.2 % (ref 38.5–50.0)
Hemoglobin: 14.6 g/dL (ref 13.2–17.1)
Lymphs Abs: 2374 cells/uL (ref 850–3900)
MCH: 32.6 pg (ref 27.0–33.0)
MCHC: 33.8 g/dL (ref 32.0–36.0)
MCV: 96.4 fL (ref 80.0–100.0)
MPV: 10.1 fL (ref 7.5–12.5)
Monocytes Relative: 11.4 %
Neutro Abs: 5612 cells/uL (ref 1500–7800)
Neutrophils Relative %: 61 %
Platelets: 344 10*3/uL (ref 140–400)
RBC: 4.48 10*6/uL (ref 4.20–5.80)
RDW: 12.8 % (ref 11.0–15.0)
Total Lymphocyte: 25.8 %
WBC: 9.2 10*3/uL (ref 3.8–10.8)

## 2022-05-01 LAB — COMPLETE METABOLIC PANEL WITH GFR
AG Ratio: 1.6 (calc) (ref 1.0–2.5)
ALT: 42 U/L (ref 9–46)
AST: 25 U/L (ref 10–35)
Albumin: 4.2 g/dL (ref 3.6–5.1)
Alkaline phosphatase (APISO): 58 U/L (ref 35–144)
BUN: 11 mg/dL (ref 7–25)
CO2: 29 mmol/L (ref 20–32)
Calcium: 9.8 mg/dL (ref 8.6–10.3)
Chloride: 101 mmol/L (ref 98–110)
Creat: 0.7 mg/dL (ref 0.70–1.35)
Globulin: 2.7 g/dL (calc) (ref 1.9–3.7)
Glucose, Bld: 87 mg/dL (ref 65–99)
Potassium: 5.6 mmol/L — ABNORMAL HIGH (ref 3.5–5.3)
Sodium: 137 mmol/L (ref 135–146)
Total Bilirubin: 0.3 mg/dL (ref 0.2–1.2)
Total Protein: 6.9 g/dL (ref 6.1–8.1)
eGFR: 103 mL/min/{1.73_m2} (ref >=60)

## 2022-05-01 LAB — SEDIMENTATION RATE: Sed Rate: 17 mm/h (ref 0–20)

## 2022-05-03 ENCOUNTER — Other Ambulatory Visit: Payer: Self-pay | Admitting: Internal Medicine

## 2022-05-03 DIAGNOSIS — M8000XD Age-related osteoporosis with current pathological fracture, unspecified site, subsequent encounter for fracture with routine healing: Secondary | ICD-10-CM

## 2022-05-04 NOTE — Progress Notes (Signed)
Results look good his sedimentation rate is normal for the first time in years so that is encouraging. Metabolic panel looks fine normal calcium and kidney function no problems for continuing alendronate.

## 2022-05-04 NOTE — Telephone Encounter (Signed)
Next Visit: 10/28/2022  Last Visit: 04/30/2022  Last Fill: 01/30/2022  DX: Osteoporosis with current pathological fracture with routine healing   Current Dose per office note 04/30/2022: alendronate 70 mg p.o. weekly   Labs: 04/30/2022 Absolute Monocytes 1,049, Potassium 5.6  Okay to refill Fosamax?

## 2022-06-15 DIAGNOSIS — M5416 Radiculopathy, lumbar region: Secondary | ICD-10-CM | POA: Diagnosis not present

## 2022-06-16 DIAGNOSIS — R739 Hyperglycemia, unspecified: Secondary | ICD-10-CM | POA: Diagnosis not present

## 2022-06-16 DIAGNOSIS — Z0000 Encounter for general adult medical examination without abnormal findings: Secondary | ICD-10-CM | POA: Diagnosis not present

## 2022-06-16 DIAGNOSIS — M353 Polymyalgia rheumatica: Secondary | ICD-10-CM | POA: Diagnosis not present

## 2022-06-16 DIAGNOSIS — I10 Essential (primary) hypertension: Secondary | ICD-10-CM | POA: Diagnosis not present

## 2022-06-16 DIAGNOSIS — D692 Other nonthrombocytopenic purpura: Secondary | ICD-10-CM | POA: Diagnosis not present

## 2022-06-16 DIAGNOSIS — G9529 Other cord compression: Secondary | ICD-10-CM | POA: Diagnosis not present

## 2022-06-16 DIAGNOSIS — J3089 Other allergic rhinitis: Secondary | ICD-10-CM | POA: Diagnosis not present

## 2022-06-16 DIAGNOSIS — E7800 Pure hypercholesterolemia, unspecified: Secondary | ICD-10-CM | POA: Diagnosis not present

## 2022-06-16 DIAGNOSIS — Z1331 Encounter for screening for depression: Secondary | ICD-10-CM | POA: Diagnosis not present

## 2022-06-16 DIAGNOSIS — I7143 Infrarenal abdominal aortic aneurysm, without rupture: Secondary | ICD-10-CM | POA: Diagnosis not present

## 2022-06-16 DIAGNOSIS — F320 Major depressive disorder, single episode, mild: Secondary | ICD-10-CM | POA: Diagnosis not present

## 2022-06-17 ENCOUNTER — Other Ambulatory Visit: Payer: Self-pay | Admitting: Family Medicine

## 2022-06-17 DIAGNOSIS — I7143 Infrarenal abdominal aortic aneurysm, without rupture: Secondary | ICD-10-CM

## 2022-06-18 ENCOUNTER — Other Ambulatory Visit: Payer: Self-pay | Admitting: Family Medicine

## 2022-06-18 DIAGNOSIS — I6523 Occlusion and stenosis of bilateral carotid arteries: Secondary | ICD-10-CM

## 2022-07-01 ENCOUNTER — Ambulatory Visit: Payer: Medicare Other

## 2022-07-06 NOTE — Therapy (Signed)
OUTPATIENT PHYSICAL THERAPY THORACOLUMBAR EVALUATION   Patient Name: Victor Ramirez MRN: [id number] DOB:03-01-1956, 67 y.o., male Today's Date: 07/07/2022  END OF SESSION:  PT End of Session - 07/07/22 0847     Visit Number 1    Number of Visits 13    Date for PT Re-Evaluation 08/18/22    Authorization Type [employer] MDC    Progress Note Due on Visit 10    PT Start Time 0848    PT Stop Time 0931    PT Time Calculation (min) 43 min    Activity Tolerance Patient tolerated treatment well    Behavior During Therapy WFL for tasks assessed/performed             Past Medical History:  Diagnosis Date   Arthritis    Hemochromatosis    Hypertension    Past Surgical History:  Procedure Laterality Date   ANTERIOR LATERAL LUMBAR FUSION WITH PERCUTANEOUS SCREW 1 LEVEL Right 11/27/2016   Procedure: Lumbar Three-Four Transpsoas Lumbar InterbodyFfusion;  Surgeon: Ditty, Loura Halt, MD;  Location: [facility] OR;  Service: Neurosurgery;  Laterality: Right;  L3-4 Transpsoas lumbar interbody fusion/L3-4 Pedicle screw fixation with posterolateral arthrodesis/minimally invasive decompression/Mazor   APPLICATION OF ROBOTIC ASSISTANCE FOR SPINAL PROCEDURE N/A 11/27/2016   Procedure: Lumbar Three-Four Pedicle Screw Fixation with Posterolateral Arthrodesis with Minimally Invasive Decompression with Application of Robotic Assistance;  Surgeon: Ditty, Loura Halt, MD;  Location[facility] OR;  Service: Neurosurgery;  Laterality: N/A;   BACK SURGERY     x3    BACK SURGERY     battery surgery    REVERSE SHOULDER ARTHROPLASTY Left 09/16/2021   Procedure: LEFT REVERSE SHOULDER ARTHROPLASTY;  Surgeon: Cammy Copa, MD;  Location: [facility] OR;  Service: Orthopedics;  Laterality: Left;   spinal manipulation under anesthesia     TONSILLECTOMY     Patient Active Problem List   Diagnosis Date Noted   Vitamin D deficiency 10/15/2021   Osteoporosis 10/15/2021   OA (osteoarthritis) of shoulder 09/16/2021   S/P  reverse total shoulder arthroplasty, left 09/16/2021   Pain in right wrist 05/12/2021   RUQ abdominal pain    Acute pancreatitis without infection or necrosis 12/02/2020   Pain in left shoulder 11/28/2020   Rheumatoid factor positive 08/29/2020   Polymyalgia rheumatica ([facility]) 08/08/2020   Polyarthralgia 08/08/2020   High risk medication use 08/08/2020   Lumbosacral spondylosis with radiculopathy 11/27/2016   Atrophy of muscle of right lower leg 02/04/2014   History of post-polio syndrome 02/02/2014   Pain of left lower extremity 02/02/2014   Chronic back pain 05/14/2013   HTN (hypertension) 05/14/2013   Headache 12/22/2011   Sleep apnea 11/17/2011   Elevation of level of transaminase or lactic acid dehydrogenase (LDH) 09/18/2010   Anxiety 09/08/2010   Tachycardia 09/08/2010   Hyperlipemia 03/01/2006   TIA (transient ischemic attack) 03/01/2006   Tobacco use disorder 03/01/2006   Depressive disorder 10/16/2005    PCP: Wilfrid Lund, PA  REFERRING PROVIDER: Wilfrid Lund, PA  REFERRING DIAG: R53.1 (ICD-10-CM) - Weakness  Rationale for Evaluation and Treatment: Rehabilitation  THERAPY DIAG:  Muscle weakness (generalized)  Other abnormalities of gait and mobility  ONSET DATE: since childhood  SUBJECTIVE:  SUBJECTIVE STATEMENT: Pt arrives w/ primary report of chronic RLE weakness since childhood. He states he had polio as a child that required RLE bracing, although he states he was also told that his RLE was weaker due to a possible leg length discrepancy or his achilles tendon being shorter. He also endorses history of back issues since 1980's for which he has had multiple surgeries, most recent in 2018. He did have a fall last year that resulted in lumbar fractures that were deemed  nonsurgical, no emergent issues and he was told to perform activity as tolerated.  Pt describes himself as fairly active and that RLE weakness seems to fluctuate, unsure if based on activity. Does notice some mild balance issues, no recent falls but endorses difficulty with prolonged standing, navigating uneven surfaces and stairs.  He states he feels most of his weakness is in his core, R hamstrings, and achilles. Denies any N/T or bowel/bladder issues. Follows with spine specialist every 3 months for injections. States he has responded well to PT in the past although has not been seen for this issue before.    PERTINENT HISTORY:  HTN, TIA,  hx polio, L reverse TSA, multiple lumbar surgeries (most recent in 2018, fracture in 2023 see MRI below)   PAIN:  Pt w/ hx of MSK issues but states pain is well controlled with current regimen, denies any pain related to this episode  PRECAUTIONS: None  WEIGHT BEARING RESTRICTIONS: No  FALLS:  Has patient fallen in last 6 months? No  LIVING ENVIRONMENT: 2 story house, avoids stairs due to RLE weakness, uses rails Lives w/ adult son, pt states they help take care of each other Also has a friend who helps with housework/yardwork   OCCUPATION: on disability   PLOF: Independent - active, enjoys [place] hunting   PATIENT GOALS: get stronger, core strengthh  NEXT MD VISIT: August  OBJECTIVE:   DIAGNOSTIC FINDINGS:  Sept 2023 lumbar MRI: "IMPRESSION: 1. L1 and L5 superior endplate fractures with mild height loss, new since 2019. Limited associated marrow edema, likely recent but nearly healed. 2. L2-3 progressive adjacent segment degeneration with compressive spinal stenosis and moderate left foraminal narrowing. 3. L3-S1 solid fusion."  PATIENT SURVEYS:  FOTO 46 current, 56 predicted  SCREENING FOR RED FLAGS: Red flag questioning/screening reassuring    COGNITION: Overall cognitive status: Within functional limits for tasks  assessed     SENSATION: Light touch intact B LE, does not endorse any neuro complaints  POSTURE: mild lateral shift to L, fwd head posture  PALPATION: Not indicated  LUMBAR ROM:   AROM eval  Flexion   Extension   Right lateral flexion   Left lateral flexion   Right rotation   Left rotation    (Blank rows = not tested) (Key: WFL = within functional limits not formally assessed, * = concordant pain, s = stiffness/stretching sensation, NT = not tested)   LOWER EXTREMITY ROM:     Active  Right eval Left eval  Hip flexion    Hip extension    Hip internal rotation    Hip external rotation    Knee extension    Knee flexion    (Blank rows = not tested) (Key: WFL = within functional limits not formally assessed, * = concordant pain, s = stiffness/stretching sensation, NT = not tested)  Comments:    LOWER EXTREMITY MMT:    MMT Right eval Left eval  Hip flexion 3+ 4  Hip abduction (modified sitting) 4 5  Hip internal rotation    Hip external rotation    Knee flexion 3+ 5  Knee extension 3+ 5  Ankle dorsiflexion 3- 5   (Blank rows = not tested) (Key: WFL = within functional limits not formally assessed, * = concordant pain, s = stiffness/stretching sensation, NT = not tested)  Comments: MMT painless   FUNCTIONAL TESTS:  5xSTS 16.40 sec no UE support, weight shift to R   BIL calf raise w/ UE balancing support x30 repetitions, increased ROM and weight shift for LLE consistent throughout repetitions, pt reports subjective improvement in ease of movement with repetition  GAIT: Distance walked: within clinic Assistive device utilized: None Level of assistance: Complete Independence Comments: increased weight shift to L, reduced truncal ROM and arm swing, reduced stance time on R  TODAY'S TREATMENT:                                                                                                                              [facility] Adult PT Treatment:                                                 DATE: 07/07/22 Therapeutic Exercise: Staggered stance STS x5 Heel raises x30 total HEP handout + education   PATIENT EDUCATION:  Education details: Pt education on PT impairments, prognosis, and POC. Informed consent. Rationale for interventions, safe/appropriate HEP performance Person educated: Patient Education method: Explanation, Demonstration, Tactile cues, Verbal cues, and Handouts Education comprehension: verbalized understanding, returned demonstration, verbal cues required, tactile cues required, and needs further education    HOME EXERCISE PROGRAM: Access Code: [email or web address] URL: [email or web address] Date: 07/07/2022 Prepared by: Fransisco Hertz  Exercises - Heel Raises with Counter Support  - 1 x daily - 7 x weekly - 2 sets - 12 reps - Staggered Sit-to-Stand  - 1 x daily - 7 x weekly - 3 sets - 5 reps  ASSESSMENT:  CLINICAL IMPRESSION: Pt is a pleasant 67 year old gentleman who arrives to PT evaluation on this date for LE weakness. Pt reports difficulty with static standing, stair navigation, balance, and higher level activities due to weakness. During today's session pt demonstrates generalized R LE weakness, altered functional mechanics, and gait impairments which are likely contributing to difficulty with aforementioned activities. Did discuss with pt that we will plan to monitor tolerance to activity given reported hx of polio affecting this limb, and aim to accommodate treatment accordingly although he notes in the past he has responded well to PT/exercise. No adverse events during today's session, pt verbalizes agreement with plan below. Recommend skilled PT to address aforementioned deficits to improve functional independence/tolerance. Pt departs today's session in no acute distress, all voiced questions/concerns addressed appropriately from PT perspective.    OBJECTIVE IMPAIRMENTS: Abnormal gait, decreased activity tolerance, decreased  balance, decreased endurance, decreased  mobility, difficulty walking, decreased ROM, decreased strength, improper body mechanics, postural dysfunction, and pain.   ACTIVITY LIMITATIONS: carrying, lifting, standing, stairs, transfers, and locomotion level  PARTICIPATION LIMITATIONS: community activity and yard work  PERSONAL FACTORS: Time since onset of injury/illness/exacerbation and 3+ comorbidities: L rTSA, HTN, hx polio, multiple back surgeries  are also affecting patient's functional outcome.   REHAB POTENTIAL: Good  CLINICAL DECISION MAKING: Stable/uncomplicated  EVALUATION COMPLEXITY: Low   GOALS: Goals reviewed with patient? No  SHORT TERM GOALS: Target date: 07/28/2022 Pt will demonstrate appropriate understanding and performance of initially prescribed HEP in order to facilitate improved independence with management of symptoms.  Baseline: HEP provided on eval Goal status: INITIAL   2. Pt will score greater than or equal to 51 on FOTO in order to demonstrate improved perception of function due to symptoms.  Baseline: 46  Goal status: INITIAL    LONG TERM GOALS: Target date: 08/18/2022  Pt will score 56 on FOTO in order to demonstrate improved perception of functional status due to symptoms.  Baseline: 46 Goal status: INITIAL  2.  Pt will report/demonstrate ability to stand for up to without increase in instability/discomfort in order to improve tolerance to ADLs. Baseline: fatigue/instability after 10-53min Goal status: INITIAL  3.  Pt will demonstrate at least 1pt increase on MMT scale globally throughout RLE in order to promote improved functional strength. Baseline: see MMT chart above Goal status: INITIAL  4. Pt will perform 5xSTS in =/<12 sec in order to demonstrate reduced fall risk and improved functional independence. (MCID of 2.3sec)  Baseline: 16sec no UE support, altered kinematics  Goal status: INITIAL   5. Pt will demonstrate ability to safely  navigate a full flight of stairs using unilateral rail and reported RPE <5/10 in order to promote improved home navigation.  Baseline: states he avoids stairs in home due to difficulty  Goal status: INITIAL   PLAN:  PT FREQUENCY: 2x/week  PT DURATION: 6 weeks  PLANNED INTERVENTIONS: Therapeutic exercises, Therapeutic activity, Neuromuscular re-education, Balance training, Gait training, Patient/Family education, Self Care, Stair training, Vestibular training, Aquatic Therapy, Cryotherapy, Moist heat, Taping, Manual therapy, and Re-evaluation.  PLAN FOR NEXT SESSION: RLE and core strengthening. Monitor response to activity and adjust accordingly.    Ashley Murrain PT, DPT 07/07/2022 10:41 AM

## 2022-07-07 ENCOUNTER — Encounter: Payer: Self-pay | Admitting: Physical Therapy

## 2022-07-07 ENCOUNTER — Ambulatory Visit: Payer: Medicare Other | Attending: Family Medicine | Admitting: Physical Therapy

## 2022-07-07 ENCOUNTER — Other Ambulatory Visit: Payer: Self-pay

## 2022-07-07 DIAGNOSIS — R2689 Other abnormalities of gait and mobility: Secondary | ICD-10-CM | POA: Diagnosis not present

## 2022-07-07 DIAGNOSIS — M6281 Muscle weakness (generalized): Secondary | ICD-10-CM | POA: Diagnosis not present

## 2022-07-21 ENCOUNTER — Ambulatory Visit
Admission: RE | Admit: 2022-07-21 | Discharge: 2022-07-21 | Disposition: A | Discharge status: Home / Self Care | Payer: Medicare Other | Source: Ambulatory Visit | Attending: Family Medicine | Admitting: Family Medicine

## 2022-07-21 DIAGNOSIS — I700 Atherosclerosis of aorta: Secondary | ICD-10-CM | POA: Diagnosis not present

## 2022-07-21 DIAGNOSIS — I714 Abdominal aortic aneurysm, without rupture, unspecified: Secondary | ICD-10-CM | POA: Diagnosis not present

## 2022-07-21 DIAGNOSIS — I7143 Infrarenal abdominal aortic aneurysm, without rupture: Secondary | ICD-10-CM

## 2022-07-21 DIAGNOSIS — I723 Aneurysm of iliac artery: Secondary | ICD-10-CM | POA: Diagnosis not present

## 2022-07-21 DIAGNOSIS — I6523 Occlusion and stenosis of bilateral carotid arteries: Secondary | ICD-10-CM | POA: Diagnosis not present

## 2022-07-22 ENCOUNTER — Ambulatory Visit: Payer: Medicare Other | Admitting: Physical Therapy

## 2022-07-22 DIAGNOSIS — M6281 Muscle weakness (generalized): Secondary | ICD-10-CM | POA: Diagnosis not present

## 2022-07-22 DIAGNOSIS — R2689 Other abnormalities of gait and mobility: Secondary | ICD-10-CM | POA: Diagnosis not present

## 2022-07-22 NOTE — Therapy (Signed)
OUTPATIENT PHYSICAL THERAPY TREATMENT   Patient Name: Victor Ramirez MRN: [id number] DOB:14-Jul-1955, 67 y.o., male Today's Date: 07/22/2022  END OF SESSION:  PT End of Session - 07/22/22 0935     Visit Number 2    Number of Visits 13    Date for PT Re-Evaluation 08/18/22    Authorization Type [employer] MDC    Progress Note Due on Visit 10    PT Start Time [phone]    PT Stop Time 1018    PT Time Calculation (min) 42 min    Activity Tolerance Patient tolerated treatment well    Behavior During Therapy WFL for tasks assessed/performed              Past Medical History:  Diagnosis Date   Arthritis    Hemochromatosis    Hypertension    Past Surgical History:  Procedure Laterality Date   ANTERIOR LATERAL LUMBAR FUSION WITH PERCUTANEOUS SCREW 1 LEVEL Right 11/27/2016   Procedure: Lumbar Three-Four Transpsoas Lumbar InterbodyFfusion;  Surgeon: Ditty, Loura Halt, MD;  Location: [facility] OR;  Service: Neurosurgery;  Laterality: Right;  L3-4 Transpsoas lumbar interbody fusion/L3-4 Pedicle screw fixation with posterolateral arthrodesis/minimally invasive decompression/Mazor   APPLICATION OF ROBOTIC ASSISTANCE FOR SPINAL PROCEDURE N/A 11/27/2016   Procedure: Lumbar Three-Four Pedicle Screw Fixation with Posterolateral Arthrodesis with Minimally Invasive Decompression with Application of Robotic Assistance;  Surgeon: Ditty, Loura Halt, MD;  Location: [facility] OR;  Service: Neurosurgery;  Laterality: N/A;   BACK SURGERY     x3    BACK SURGERY     battery surgery    REVERSE SHOULDER ARTHROPLASTY Left 09/16/2021   Procedure: LEFT REVERSE SHOULDER ARTHROPLASTY;  Surgeon: Cammy Copa, MD;  Location: [facility] OR;  Service: Orthopedics;  Laterality: Left;   spinal manipulation under anesthesia     TONSILLECTOMY     Patient Active Problem List   Diagnosis Date Noted   Vitamin D deficiency 10/15/2021   Osteoporosis 10/15/2021   OA (osteoarthritis) of shoulder 09/16/2021   S/P reverse total  shoulder arthroplasty, left 09/16/2021   Pain in right wrist 05/12/2021   RUQ abdominal pain    Acute pancreatitis without infection or necrosis 12/02/2020   Pain in left shoulder 11/28/2020   Rheumatoid factor positive 08/29/2020   Polymyalgia rheumatica (HCC) 08/08/2020   Polyarthralgia 08/08/2020   High risk medication use 08/08/2020   Lumbosacral spondylosis with radiculopathy 11/27/2016   Atrophy of muscle of right lower leg 02/04/2014   History of post-polio syndrome 02/02/2014   Pain of left lower extremity 02/02/2014   Chronic back pain 05/14/2013   HTN (hypertension) 05/14/2013   Headache 12/22/2011   Sleep apnea 11/17/2011   Elevation of level of transaminase or lactic acid dehydrogenase (LDH) 09/18/2010   Anxiety 09/08/2010   Tachycardia 09/08/2010   Hyperlipemia 03/01/2006   TIA (transient ischemic attack) 03/01/2006   Tobacco use disorder 03/01/2006   Depressive disorder 10/16/2005    PCP: Wilfrid Lund, PA  REFERRING PROVIDER: Wilfrid Lund, PA  REFERRING DIAG: R53.1 (ICD-10-CM) - Weakness  Rationale for Evaluation and Treatment: Rehabilitation  THERAPY DIAG:  Muscle weakness (generalized)  Other abnormalities of gait and mobility  ONSET DATE: since childhood  SUBJECTIVE:  SUBJECTIVE STATEMENT: "Had an [place] for a possible AAA and I am waiting to hear back."   PERTINENT HISTORY:  HTN, TIA,  hx polio, L reverse TSA, multiple lumbar surgeries (most recent in 2018, fracture in 2023 see MRI below)   PAIN:  Pt w/ hx of MSK issues but states pain is well controlled with current regimen, denies any pain related to this episode  PRECAUTIONS: None  WEIGHT BEARING RESTRICTIONS: No  FALLS:  Has patient fallen in last 6 months? No  LIVING ENVIRONMENT: 2 story house, avoids  stairs due to RLE weakness, uses rails Lives w/ adult son, pt states they help take care of each other Also has a friend who helps with housework/yardwork   OCCUPATION: on disability   PLOF: Independent - active, enjoys [place] hunting   PATIENT GOALS: get stronger, core strengthh  NEXT MD VISIT: August  OBJECTIVE:   DIAGNOSTIC FINDINGS:  Sept 2023 lumbar MRI: "IMPRESSION: 1. L1 and L5 superior endplate fractures with mild height loss, new since 2019. Limited associated marrow edema, likely recent but nearly healed. 2. L2-3 progressive adjacent segment degeneration with compressive spinal stenosis and moderate left foraminal narrowing. 3. L3-S1 solid fusion."  PATIENT SURVEYS:  FOTO 46 current, 56 predicted  SCREENING FOR RED FLAGS: Red flag questioning/screening reassuring    COGNITION: Overall cognitive status: Within functional limits for tasks assessed     SENSATION: Light touch intact B LE, does not endorse any neuro complaints  POSTURE: mild lateral shift to L, fwd head posture  PALPATION: Not indicated  LUMBAR ROM:   AROM eval  Flexion   Extension   Right lateral flexion   Left lateral flexion   Right rotation   Left rotation    (Blank rows = not tested) (Key: WFL = within functional limits not formally assessed, * = concordant pain, s = stiffness/stretching sensation, NT = not tested)   LOWER EXTREMITY ROM:     Active  Right eval Left eval  Hip flexion    Hip extension    Hip internal rotation    Hip external rotation    Knee extension    Knee flexion    (Blank rows = not tested) (Key: WFL = within functional limits not formally assessed, * = concordant pain, s = stiffness/stretching sensation, NT = not tested)  Comments:    LOWER EXTREMITY MMT:    MMT Right eval Left eval  Hip flexion 3+ 4  Hip abduction (modified sitting) 4 5  Hip internal rotation    Hip external rotation    Knee flexion 3+ 5  Knee extension 3+ 5  Ankle  dorsiflexion 3- 5   (Blank rows = not tested) (Key: WFL = within functional limits not formally assessed, * = concordant pain, s = stiffness/stretching sensation, NT = not tested)  Comments: MMT painless   FUNCTIONAL TESTS:  5xSTS 16.40 sec no UE support, weight shift to R   BIL calf raise w/ UE balancing support x30 repetitions, increased ROM and weight shift for LLE consistent throughout repetitions, pt reports subjective improvement in ease of movement with repetition  GAIT: Distance walked: within clinic Assistive device utilized: None Level of assistance: Complete Independence Comments: increased weight shift to L, reduced truncal ROM and arm swing, reduced stance time on R  TODAY'S TREATMENT:  [facility] Adult PT Treatment:                                                DATE: 07/22/2022 Therapeutic Exercise: Nu-step L6 x 7 min LE only goal to keep MET at 2.0 or higher to promote endurance  EMOM x 4 sets LAQ x 12 reps with 3# alternating L/R KB squat 12 reps 10# UE ball press seated holding 5 sec each AMRAP (verbal cues to keep arms straight) Hip abduction in sidelying Heel raise with bil UE HHA from chair AMRAP    [facility] Adult PT Treatment:                                                DATE: 07/07/22 Therapeutic Exercise: Staggered stance STS x5 Heel raises x30 total HEP handout + education   PATIENT EDUCATION:  Education details: Pt education on PT impairments, prognosis, and POC. Informed consent. Rationale for interventions, safe/appropriate HEP performance Person educated: Patient Education method: Explanation, Demonstration, Tactile cues, Verbal cues, and Handouts Education comprehension: verbalized understanding, returned demonstration, verbal cues required, tactile cues required, and needs further education    HOME EXERCISE PROGRAM: Access Code:  [email or web address] URL: [email or web address] Date: 07/07/2022 Prepared by: Fransisco Hertz  Exercises - Heel Raises with Counter Support  - 1 x daily - 7 x weekly - 2 sets - 12 reps - Staggered Sit-to-Stand  - 1 x daily - 7 x weekly - 3 sets - 5 reps  ASSESSMENT:  CLINICAL IMPRESSION: Pt arrives to PT today reporting some abdominal soreness secondary to an [place] regarding a AAA that was performed recently. Focused on core and LE strengthening via EMOM with AMRAP activities for seated core and heel raises. He did well with this technique but did report fatigue especially with seated core activation. No pain during or following session and reported he felt good.   EVALUATION: Pt is a pleasant 67 year old gentleman who arrives to PT evaluation on this date for LE weakness. Pt reports difficulty with static standing, stair navigation, balance, and higher level activities due to weakness. During today's session pt demonstrates generalized R LE weakness, altered functional mechanics, and gait impairments which are likely contributing to difficulty with aforementioned activities. Did discuss with pt that we will plan to monitor tolerance to activity given reported hx of polio affecting this limb, and aim to accommodate treatment accordingly although he notes in the past he has responded well to PT/exercise. No adverse events during today's session, pt verbalizes agreement with plan below. Recommend skilled PT to address aforementioned deficits to improve functional independence/tolerance. Pt departs today's session in no acute distress, all voiced questions/concerns addressed appropriately from PT perspective.    OBJECTIVE IMPAIRMENTS: Abnormal gait, decreased activity tolerance, decreased balance, decreased endurance, decreased mobility, difficulty walking, decreased ROM, decreased strength, improper body mechanics, postural dysfunction, and pain.   ACTIVITY LIMITATIONS: carrying, lifting, standing,  stairs, transfers, and locomotion level  PARTICIPATION LIMITATIONS: community activity and yard work  PERSONAL FACTORS: Time since onset of injury/illness/exacerbation and 3+ comorbidities: L rTSA, HTN, hx polio, multiple back surgeries  are also affecting patient's functional outcome.   REHAB POTENTIAL: Good  CLINICAL DECISION MAKING: Stable/uncomplicated  EVALUATION COMPLEXITY: Low  GOALS: Goals reviewed with patient? No  SHORT TERM GOALS: Target date: 07/28/2022 Pt will demonstrate appropriate understanding and performance of initially prescribed HEP in order to facilitate improved independence with management of symptoms.  Baseline: HEP provided on eval Goal status: INITIAL   2. Pt will score greater than or equal to 51 on FOTO in order to demonstrate improved perception of function due to symptoms.  Baseline: 46  Goal status: INITIAL    LONG TERM GOALS: Target date: 08/18/2022  Pt will score 56 on FOTO in order to demonstrate improved perception of functional status due to symptoms.  Baseline: 46 Goal status: INITIAL  2.  Pt will report/demonstrate ability to stand for up to without increase in instability/discomfort in order to improve tolerance to ADLs. Baseline: fatigue/instability after 10-27min Goal status: INITIAL  3.  Pt will demonstrate at least 1pt increase on MMT scale globally throughout RLE in order to promote improved functional strength. Baseline: see MMT chart above Goal status: INITIAL  4. Pt will perform 5xSTS in =/<12 sec in order to demonstrate reduced fall risk and improved functional independence. (MCID of 2.3sec)  Baseline: 16sec no UE support, altered kinematics  Goal status: INITIAL   5. Pt will demonstrate ability to safely navigate a full flight of stairs using unilateral rail and reported RPE <5/10 in order to promote improved home navigation.  Baseline: states he avoids stairs in home due to difficulty  Goal status: INITIAL    PLAN:  PT FREQUENCY: 2x/week  PT DURATION: 6 weeks  PLANNED INTERVENTIONS: Therapeutic exercises, Therapeutic activity, Neuromuscular re-education, Balance training, Gait training, Patient/Family education, Self Care, Stair training, Vestibular training, Aquatic Therapy, Cryotherapy, Moist heat, Taping, Manual therapy, and Re-evaluation.  PLAN FOR NEXT SESSION: RLE and core strengthening. Monitor response to activity and adjust accordingly.    Ashley Murrain PT, DPT 07/22/2022 10:22 AM

## 2022-07-23 NOTE — Therapy (Signed)
OUTPATIENT PHYSICAL THERAPY TREATMENT   Patient Name: Victor Ramirez MRN: [id number] DOB:24-Jul-1955, 67 y.o., male Today's Date: 07/24/2022  END OF SESSION:  PT End of Session - 07/24/22 0843     Visit Number 3    Number of Visits 13    Date for PT Re-Evaluation 08/18/22    Authorization Type [employer] MDC    Progress Note Due on Visit 10    PT Start Time 0844    PT Stop Time 0925    PT Time Calculation (min) 41 min    Activity Tolerance Patient tolerated treatment well    Behavior During Therapy [facility] for tasks assessed/performed               Past Medical History:  Diagnosis Date   Arthritis    Hemochromatosis    Hypertension    Past Surgical History:  Procedure Laterality Date   ANTERIOR LATERAL LUMBAR FUSION WITH PERCUTANEOUS SCREW 1 LEVEL Right 11/27/2016   Procedure: Lumbar Three-Four Transpsoas Lumbar InterbodyFfusion;  Surgeon: Ditty, Loura Halt, MD;  Location: [facility] OR;  Service: Neurosurgery;  Laterality: Right;  L3-4 Transpsoas lumbar interbody fusion/L3-4 Pedicle screw fixation with posterolateral arthrodesis/minimally invasive decompression/Mazor   APPLICATION OF ROBOTIC ASSISTANCE FOR SPINAL PROCEDURE N/A 11/27/2016   Procedure: Lumbar Three-Four Pedicle Screw Fixation with Posterolateral Arthrodesis with Minimally Invasive Decompression with Application of Robotic Assistance;  Surgeon: Ditty, Loura Halt, MD;  Location: [facility] OR;  Service: Neurosurgery;  Laterality: N/A;   BACK SURGERY     x3    BACK SURGERY     battery surgery    REVERSE SHOULDER ARTHROPLASTY Left 09/16/2021   Procedure: LEFT REVERSE SHOULDER ARTHROPLASTY;  Surgeon: Cammy Copa, MD;  Location: [facility] OR;  Service: Orthopedics;  Laterality: Left;   spinal manipulation under anesthesia     TONSILLECTOMY     Patient Active Problem List   Diagnosis Date Noted   Vitamin D deficiency 10/15/2021   Osteoporosis 10/15/2021   OA (osteoarthritis) of shoulder 09/16/2021   S/P reverse total  shoulder arthroplasty, left 09/16/2021   Pain in right wrist 05/12/2021   RUQ abdominal pain    Acute pancreatitis without infection or necrosis 12/02/2020   Pain in left shoulder 11/28/2020   Rheumatoid factor positive 08/29/2020   Polymyalgia rheumatica (HCC) 08/08/2020   Polyarthralgia 08/08/2020   High risk medication use 08/08/2020   Lumbosacral spondylosis with radiculopathy 11/27/2016   Atrophy of muscle of right lower leg 02/04/2014   History of post-polio syndrome 02/02/2014   Pain of left lower extremity 02/02/2014   Chronic back pain 05/14/2013   HTN (hypertension) 05/14/2013   Headache 12/22/2011   Sleep apnea 11/17/2011   Elevation of level of transaminase or lactic acid dehydrogenase (LDH) 09/18/2010   Anxiety 09/08/2010   Tachycardia 09/08/2010   Hyperlipemia 03/01/2006   TIA (transient ischemic attack) 03/01/2006   Tobacco use disorder 03/01/2006   Depressive disorder 10/16/2005    PCP: Wilfrid Lund, PA  REFERRING PROVIDER: Wilfrid Lund, PA  REFERRING DIAG: R53.1 (ICD-10-CM) - Weakness  Rationale for Evaluation and Treatment: Rehabilitation  THERAPY DIAG:  Muscle weakness (generalized)  Other abnormalities of gait and mobility  ONSET DATE: since childhood  SUBJECTIVE:  SUBJECTIVE STATEMENT: Pt states he is getting referred to vascular as it seems his old AAA has increased in size, but denies any new symptoms or restrictions from physician. States he felt great after last session.   PERTINENT HISTORY:  HTN, TIA,  hx polio, L reverse TSA, multiple lumbar surgeries (most recent in 2018, fracture in 2023 see MRI below)   PAIN:  Pt w/ hx of MSK issues but states pain is well controlled with current regimen, denies any pain related to this episode  PRECAUTIONS:  None  WEIGHT BEARING RESTRICTIONS: No  FALLS:  Has patient fallen in last 6 months? No  LIVING ENVIRONMENT: 2 story house, avoids stairs due to RLE weakness, uses rails Lives w/ adult son, pt states they help take care of each other Also has a friend who helps with housework/yardwork   OCCUPATION: on disability   PLOF: Independent - active, enjoys [place] hunting   PATIENT GOALS: get stronger, core strengthh  NEXT MD VISIT: August  OBJECTIVE:   DIAGNOSTIC FINDINGS:  Sept 2023 lumbar MRI: "IMPRESSION: 1. L1 and L5 superior endplate fractures with mild height loss, new since 2019. Limited associated marrow edema, likely recent but nearly healed. 2. L2-3 progressive adjacent segment degeneration with compressive spinal stenosis and moderate left foraminal narrowing. 3. L3-S1 solid fusion."  PATIENT SURVEYS:  FOTO 46 current, 56 predicted  SCREENING FOR RED FLAGS: Red flag questioning/screening reassuring    COGNITION: Overall cognitive status: Within functional limits for tasks assessed     SENSATION: Light touch intact B LE, does not endorse any neuro complaints  POSTURE: mild lateral shift to L, fwd head posture  PALPATION: Not indicated  LUMBAR ROM:   AROM eval  Flexion   Extension   Right lateral flexion   Left lateral flexion   Right rotation   Left rotation    (Blank rows = not tested) (Key: WFL = within functional limits not formally assessed, * = concordant pain, s = stiffness/stretching sensation, NT = not tested)   LOWER EXTREMITY ROM:     Active  Right eval Left eval  Hip flexion    Hip extension    Hip internal rotation    Hip external rotation    Knee extension    Knee flexion    (Blank rows = not tested) (Key: WFL = within functional limits not formally assessed, * = concordant pain, s = stiffness/stretching sensation, NT = not tested)  Comments:    LOWER EXTREMITY MMT:    MMT Right eval Left eval  Hip flexion 3+ 4  Hip  abduction (modified sitting) 4 5  Hip internal rotation    Hip external rotation    Knee flexion 3+ 5  Knee extension 3+ 5  Ankle dorsiflexion 3- 5   (Blank rows = not tested) (Key: WFL = within functional limits not formally assessed, * = concordant pain, s = stiffness/stretching sensation, NT = not tested)  Comments: MMT painless   FUNCTIONAL TESTS:  5xSTS 16.40 sec no UE support, weight shift to R   BIL calf raise w/ UE balancing support x30 repetitions, increased ROM and weight shift for LLE consistent throughout repetitions, pt reports subjective improvement in ease of movement with repetition  GAIT: Distance walked: within clinic Assistive device utilized: None Level of assistance: Complete Independence Comments: increased weight shift to L, reduced truncal ROM and arm swing, reduced stance time on R  TODAY'S TREATMENT:  [facility] Adult PT Treatment:                                                DATE: 07/24/22 Therapeutic Exercise: 5# suitcase carry 3x63ft BIL cues for posture and pacing LAQ 3# 3x12 BIL alternating cues for trunk support  Machine hamstring curl 20# single leg cues for form and control, 3x8 BIL  6 inch runner step up x10 BIL cues for posture and UE support 4 inch runner step up x10 BIL  HEP update + handout/education   [facility] Adult PT Treatment:                                                DATE: 07/22/2022 Therapeutic Exercise: Nu-step L6 x 7 min LE only goal to keep MET at 2.0 or higher to promote endurance  EMOM x 4 sets LAQ x 12 reps with 3# alternating L/R KB squat 12 reps 10# UE ball press seated holding 5 sec each AMRAP (verbal cues to keep arms straight) Hip abduction in sidelying Heel raise with bil UE HHA from chair AMRAP    [facility] Adult PT Treatment:                                                DATE: 07/07/22 Therapeutic  Exercise: Staggered stance STS x5 Heel raises x30 total HEP handout + education   PATIENT EDUCATION:  Education details: rationale for interventions, HEP Person educated: Patient Education method: Explanation, Demonstration, Tactile cues, Verbal cues, and Handouts Education comprehension: verbalized understanding, returned demonstration, verbal cues required, tactile cues required, and needs further education    HOME EXERCISE PROGRAM: Access Code: [email or web address] URL: [email or web address] Date: 07/24/2022 Prepared by: Fransisco Hertz  Exercises - Heel Raises with Counter Support  - 1 x daily - 7 x weekly - 2 sets - 12 reps - Staggered Sit-to-Stand  - 1 x daily - 7 x weekly - 3 sets - 5 reps - Seated Long Arc Quad  - 1 x daily - 7 x weekly - 3 sets - 10 reps  ASSESSMENT:  CLINICAL IMPRESSION: 07/24/2022 Pt arrives w/o pain, states he felt great after last session. Today focusing on progression of volume for LE strengthening which pt tolerates well without any pain or adverse event. Does note muscular fatigue as session goes on, most difficulty noted with runner step ups due to compensatory use of momentum, on second set regressed step height for improved performance. Pt states he feels "great" at end of session. Recommend continuing along current POC in order to address relevant deficits and improve functional tolerance. Pt departs today's session in no acute distress, all voiced questions/concerns addressed appropriately from PT perspective.     EVALUATION: Pt is a pleasant 67 year old gentleman who arrives to PT evaluation on this date for LE weakness. Pt reports difficulty with static standing, stair navigation, balance, and higher level activities due to weakness. During today's session pt demonstrates generalized R LE weakness, altered functional mechanics, and gait impairments which are likely contributing to difficulty with aforementioned activities. Did discuss with pt that  we will plan to monitor tolerance to activity given reported hx of polio affecting this limb, and aim to accommodate treatment accordingly although he notes in the past he has responded well to PT/exercise. No adverse events during today's session, pt verbalizes agreement with plan below. Recommend skilled PT to address aforementioned deficits to improve functional independence/tolerance. Pt departs today's session in no acute distress, all voiced questions/concerns addressed appropriately from PT perspective.    OBJECTIVE IMPAIRMENTS: Abnormal gait, decreased activity tolerance, decreased balance, decreased endurance, decreased mobility, difficulty walking, decreased ROM, decreased strength, improper body mechanics, postural dysfunction, and pain.   ACTIVITY LIMITATIONS: carrying, lifting, standing, stairs, transfers, and locomotion level  PARTICIPATION LIMITATIONS: community activity and yard work  PERSONAL FACTORS: Time since onset of injury/illness/exacerbation and 3+ comorbidities: L rTSA, HTN, hx polio, multiple back surgeries  are also affecting patient's functional outcome.   REHAB POTENTIAL: Good  CLINICAL DECISION MAKING: Stable/uncomplicated  EVALUATION COMPLEXITY: Low   GOALS: Goals reviewed with patient? No  SHORT TERM GOALS: Target date: 07/28/2022 Pt will demonstrate appropriate understanding and performance of initially prescribed HEP in order to facilitate improved independence with management of symptoms.  Baseline: HEP provided on eval Goal status: INITIAL   2. Pt will score greater than or equal to 51 on FOTO in order to demonstrate improved perception of function due to symptoms.  Baseline: 46  Goal status: INITIAL    LONG TERM GOALS: Target date: 08/18/2022  Pt will score 56 on FOTO in order to demonstrate improved perception of functional status due to symptoms.  Baseline: 46 Goal status: INITIAL  2.  Pt will report/demonstrate ability to stand for up to  without increase in instability/discomfort in order to improve tolerance to ADLs. Baseline: fatigue/instability after 10-82min Goal status: INITIAL  3.  Pt will demonstrate at least 1pt increase on MMT scale globally throughout RLE in order to promote improved functional strength. Baseline: see MMT chart above Goal status: INITIAL  4. Pt will perform 5xSTS in =/<12 sec in order to demonstrate reduced fall risk and improved functional independence. (MCID of 2.3sec)  Baseline: 16sec no UE support, altered kinematics  Goal status: INITIAL   5. Pt will demonstrate ability to safely navigate a full flight of stairs using unilateral rail and reported RPE <5/10 in order to promote improved home navigation.  Baseline: states he avoids stairs in home due to difficulty  Goal status: INITIAL   PLAN:  PT FREQUENCY: 2x/week  PT DURATION: 6 weeks  PLANNED INTERVENTIONS: Therapeutic exercises, Therapeutic activity, Neuromuscular re-education, Balance training, Gait training, Patient/Family education, Self Care, Stair training, Vestibular training, Aquatic Therapy, Cryotherapy, Moist heat, Taping, Manual therapy, and Re-evaluation.  PLAN FOR NEXT SESSION: RLE and core strengthening. Monitor response to activity and adjust accordingly.     Ashley Murrain PT, DPT 07/24/2022 10:13 AM

## 2022-07-24 ENCOUNTER — Encounter: Payer: Self-pay | Admitting: Physical Therapy

## 2022-07-24 ENCOUNTER — Ambulatory Visit: Payer: Medicare Other | Admitting: Physical Therapy

## 2022-07-24 DIAGNOSIS — R2689 Other abnormalities of gait and mobility: Secondary | ICD-10-CM | POA: Diagnosis not present

## 2022-07-24 DIAGNOSIS — M6281 Muscle weakness (generalized): Secondary | ICD-10-CM | POA: Diagnosis not present

## 2022-07-29 ENCOUNTER — Ambulatory Visit: Payer: Medicare Other | Admitting: Physical Therapy

## 2022-07-29 ENCOUNTER — Encounter: Payer: Self-pay | Admitting: Physical Therapy

## 2022-07-29 DIAGNOSIS — M6281 Muscle weakness (generalized): Secondary | ICD-10-CM

## 2022-07-29 DIAGNOSIS — R2689 Other abnormalities of gait and mobility: Secondary | ICD-10-CM | POA: Diagnosis not present

## 2022-07-29 NOTE — Therapy (Signed)
OUTPATIENT PHYSICAL THERAPY TREATMENT   Patient Name: Victor Ramirez MRN: [id number] DOB:[birth year], 67 y.o., male Today's Date: 07/29/2022  END OF SESSION:  PT End of Session - 07/29/22 0951     Visit Number 4    Number of Visits 13    Date for PT Re-Evaluation 08/18/22    Authorization Type [employer] MDC    Progress Note Due on Visit 10    PT Start Time [phone]   pt arrived 15 min late   PT Stop Time 1015    PT Time Calculation (min) 29 min    Activity Tolerance Patient tolerated treatment well    Behavior During Therapy WFL for tasks assessed/performed                Past Medical History:  Diagnosis Date   Arthritis    Hemochromatosis    Hypertension    Past Surgical History:  Procedure Laterality Date   ANTERIOR LATERAL LUMBAR FUSION WITH PERCUTANEOUS SCREW 1 LEVEL Right 11/27/2016   Procedure: Lumbar Three-Four Transpsoas Lumbar InterbodyFfusion;  Surgeon: Ditty, Loura Halt, MD;  Location: [facility] OR;  Service: Neurosurgery;  Laterality: Right;  L3-4 Transpsoas lumbar interbody fusion/L3-4 Pedicle screw fixation with posterolateral arthrodesis/minimally invasive decompression/Mazor   APPLICATION OF ROBOTIC ASSISTANCE FOR SPINAL PROCEDURE N/A 11/27/2016   Procedure: Lumbar Three-Four Pedicle Screw Fixation with Posterolateral Arthrodesis with Minimally Invasive Decompression with Application of Robotic Assistance;  Surgeon: Ditty, Loura Halt, MD;  Location: [facility] OR;  Service: Neurosurgery;  Laterality: N/A;   BACK SURGERY     x3    BACK SURGERY     battery surgery    REVERSE SHOULDER ARTHROPLASTY Left 09/16/2021   Procedure: LEFT REVERSE SHOULDER ARTHROPLASTY;  Surgeon: Cammy Copa, MD;  Location: [facility] OR;  Service: Orthopedics;  Laterality: Left;   spinal manipulation under anesthesia     TONSILLECTOMY     Patient Active Problem List   Diagnosis Date Noted   Vitamin D deficiency 10/15/2021   Osteoporosis 10/15/2021   OA (osteoarthritis) of shoulder  09/16/2021   S/P reverse total shoulder arthroplasty, left 09/16/2021   Pain in right wrist 05/12/2021   RUQ abdominal pain    Acute pancreatitis without infection or necrosis 12/02/2020   Pain in left shoulder 11/28/2020   Rheumatoid factor positive 08/29/2020   Polymyalgia rheumatica (HCC) 08/08/2020   Polyarthralgia 08/08/2020   High risk medication use 08/08/2020   Lumbosacral spondylosis with radiculopathy 11/27/2016   Atrophy of muscle of right lower leg 02/04/2014   History of post-polio syndrome 02/02/2014   Pain of left lower extremity 02/02/2014   Chronic back pain 05/14/2013   HTN (hypertension) 05/14/2013   Headache 12/22/2011   Sleep apnea 11/17/2011   Elevation of level of transaminase or lactic acid dehydrogenase (LDH) 09/18/2010   Anxiety 09/08/2010   Tachycardia 09/08/2010   Hyperlipemia 03/01/2006   TIA (transient ischemic attack) 03/01/2006   Tobacco use disorder 03/01/2006   Depressive disorder 10/16/2005    PCP: Wilfrid Lund, PA  REFERRING PROVIDER: Wilfrid Lund, PA  REFERRING DIAG: R53.1 (ICD-10-CM) - Weakness  Rationale for Evaluation and Treatment: Rehabilitation  THERAPY DIAG:  No diagnosis found.  ONSET DATE: since childhood  SUBJECTIVE:  SUBJECTIVE STATEMENT: " No complaints."  PERTINENT HISTORY:  HTN, TIA,  hx polio, L reverse TSA, multiple lumbar surgeries (most recent in 2018, fracture in 2023 see MRI below)   PAIN:  Pt w/ hx of MSK issues but states pain is well controlled with current regimen, denies any pain related to this episode  PRECAUTIONS: None  WEIGHT BEARING RESTRICTIONS: No  FALLS:  Has patient fallen in last 6 months? No  LIVING ENVIRONMENT: 2 story house, avoids stairs due to RLE weakness, uses rails Lives w/ adult son, pt  states they help take care of each other Also has a friend who helps with housework/yardwork   OCCUPATION: on disability   PLOF: Independent - active, enjoys [place] hunting   PATIENT GOALS: get stronger, core strengthh  NEXT MD VISIT: August  OBJECTIVE:   DIAGNOSTIC FINDINGS:  Sept 2023 lumbar MRI: "IMPRESSION: 1. L1 and L5 superior endplate fractures with mild height loss, new since 2019. Limited associated marrow edema, likely recent but nearly healed. 2. L2-3 progressive adjacent segment degeneration with compressive spinal stenosis and moderate left foraminal narrowing. 3. L3-S1 solid fusion."  PATIENT SURVEYS:  FOTO 46 current, 56 predicted  SCREENING FOR RED FLAGS: Red flag questioning/screening reassuring    COGNITION: Overall cognitive status: Within functional limits for tasks assessed     SENSATION: Light touch intact B LE, does not endorse any neuro complaints  POSTURE: mild lateral shift to L, fwd head posture  PALPATION: Not indicated  LUMBAR ROM:   AROM eval  Flexion   Extension   Right lateral flexion   Left lateral flexion   Right rotation   Left rotation    (Blank rows = not tested) (Key: WFL = within functional limits not formally assessed, * = concordant pain, s = stiffness/stretching sensation, NT = not tested)   LOWER EXTREMITY ROM:     Active  Right eval Left eval  Hip flexion    Hip extension    Hip internal rotation    Hip external rotation    Knee extension    Knee flexion    (Blank rows = not tested) (Key: WFL = within functional limits not formally assessed, * = concordant pain, s = stiffness/stretching sensation, NT = not tested)  Comments:    LOWER EXTREMITY MMT:    MMT Right eval Left eval  Hip flexion 3+ 4  Hip abduction (modified sitting) 4 5  Hip internal rotation    Hip external rotation    Knee flexion 3+ 5  Knee extension 3+ 5  Ankle dorsiflexion 3- 5   (Blank rows = not tested) (Key: WFL = within  functional limits not formally assessed, * = concordant pain, s = stiffness/stretching sensation, NT = not tested)  Comments: MMT painless   FUNCTIONAL TESTS:  5xSTS 16.40 sec no UE support, weight shift to R   BIL calf raise w/ UE balancing support x30 repetitions, increased ROM and weight shift for LLE consistent throughout repetitions, pt reports subjective improvement in ease of movement with repetition  GAIT: Distance walked: within clinic Assistive device utilized: None Level of assistance: Complete Independence Comments: increased weight shift to L, reduced truncal ROM and arm swing, reduced stance time on R  TODAY'S TREATMENT:  [facility] Adult PT Treatment:                                                DATE: 07/29/2022 Therapeutic Exercise: Elliptical L5 x 2 min with ramp L4 Nu-step L5 x 2 min LE only  Bosu lunge 2 x 5 reps (switching lead leg at 5 reps) - modified to mini lunge with the RLE  Bridges with heels on black foam roller with glute set x 5 sec hold x 5 reps x 2 sets Unilateral supine clamshell with BTB x 15 Bridge with hip abduction with BTB x 20  Dead bug 5 x 5 sec hold x 2 sets    [facility] Adult PT Treatment:                                                DATE: 07/24/22 Therapeutic Exercise: 5# suitcase carry 3x92ft BIL cues for posture and pacing LAQ 3# 3x12 BIL alternating cues for trunk support  Machine hamstring curl 20# single leg cues for form and control, 3x8 BIL  6 inch runner step up x10 BIL cues for posture and UE support 4 inch runner step up x10 BIL  HEP update + handout/education   [facility] Adult PT Treatment:                                                DATE: 07/22/2022 Therapeutic Exercise: Nu-step L6 x 7 min LE only goal to keep MET at 2.0 or higher to promote endurance  EMOM x 4 sets LAQ x 12 reps with 3# alternating L/R KB  squat 12 reps 10# UE ball press seated holding 5 sec each AMRAP (verbal cues to keep arms straight) Hip abduction in sidelying Heel raise with bil UE HHA from chair AMRAP   PATIENT EDUCATION:  Education details: rationale for interventions, HEP Person educated: Patient Education method: Explanation, Demonstration, Tactile cues, Verbal cues, and Handouts Education comprehension: verbalized understanding, returned demonstration, verbal cues required, tactile cues required, and needs further education    HOME EXERCISE PROGRAM: Access Code: [email or web address] URL: [email or web address] Date: 07/24/2022 Prepared by: Fransisco Hertz  Exercises - Heel Raises with Counter Support  - 1 x daily - 7 x weekly - 2 sets - 12 reps - Staggered Sit-to-Stand  - 1 x daily - 7 x weekly - 3 sets - 5 reps - Seated Long Arc Quad  - 1 x daily - 7 x weekly - 3 sets - 10 reps  ASSESSMENT:  CLINICAL IMPRESSION: 07/29/2022 Limited session due to pt arriving late today. Continued working on strengthening of both LE and core with limited rest time to promote endurance training. He reported no pain during or following session but does continue to fatigue quickly with exercise and require multi-modal cues to avoid compensation.    EVALUATION: Pt is a pleasant 67 year old gentleman who arrives to PT evaluation on this date for LE weakness. Pt reports difficulty with static standing, stair navigation, balance, and higher level activities due to weakness. During today's session pt demonstrates generalized R LE weakness, altered functional mechanics,  and gait impairments which are likely contributing to difficulty with aforementioned activities. Did discuss with pt that we will plan to monitor tolerance to activity given reported hx of polio affecting this limb, and aim to accommodate treatment accordingly although he notes in the past he has responded well to PT/exercise. No adverse events during today's session, pt  verbalizes agreement with plan below. Recommend skilled PT to address aforementioned deficits to improve functional independence/tolerance. Pt departs today's session in no acute distress, all voiced questions/concerns addressed appropriately from PT perspective.    OBJECTIVE IMPAIRMENTS: Abnormal gait, decreased activity tolerance, decreased balance, decreased endurance, decreased mobility, difficulty walking, decreased ROM, decreased strength, improper body mechanics, postural dysfunction, and pain.   ACTIVITY LIMITATIONS: carrying, lifting, standing, stairs, transfers, and locomotion level  PARTICIPATION LIMITATIONS: community activity and yard work  PERSONAL FACTORS: Time since onset of injury/illness/exacerbation and 3+ comorbidities: L rTSA, HTN, hx polio, multiple back surgeries  are also affecting patient's functional outcome.   REHAB POTENTIAL: Good  CLINICAL DECISION MAKING: Stable/uncomplicated  EVALUATION COMPLEXITY: Low   GOALS: Goals reviewed with patient? No  SHORT TERM GOALS: Target date: 07/28/2022 Pt will demonstrate appropriate understanding and performance of initially prescribed HEP in order to facilitate improved independence with management of symptoms.  Baseline: HEP provided on eval Goal status: INITIAL   2. Pt will score greater than or equal to 51 on FOTO in order to demonstrate improved perception of function due to symptoms.  Baseline: 46  Goal status: INITIAL    LONG TERM GOALS: Target date: 08/18/2022  Pt will score 56 on FOTO in order to demonstrate improved perception of functional status due to symptoms.  Baseline: 46 Goal status: INITIAL  2.  Pt will report/demonstrate ability to stand for up to without increase in instability/discomfort in order to improve tolerance to ADLs. Baseline: fatigue/instability after 10-73min Goal status: INITIAL  3.  Pt will demonstrate at least 1pt increase on MMT scale globally throughout RLE in order to  promote improved functional strength. Baseline: see MMT chart above Goal status: INITIAL  4. Pt will perform 5xSTS in =/<12 sec in order to demonstrate reduced fall risk and improved functional independence. (MCID of 2.3sec)  Baseline: 16sec no UE support, altered kinematics  Goal status: INITIAL   5. Pt will demonstrate ability to safely navigate a full flight of stairs using unilateral rail and reported RPE <5/10 in order to promote improved home navigation.  Baseline: states he avoids stairs in home due to difficulty  Goal status: INITIAL   PLAN:  PT FREQUENCY: 2x/week  PT DURATION: 6 weeks  PLANNED INTERVENTIONS: Therapeutic exercises, Therapeutic activity, Neuromuscular re-education, Balance training, Gait training, Patient/Family education, Self Care, Stair training, Vestibular training, Aquatic Therapy, Cryotherapy, Moist heat, Taping, Manual therapy, and Re-evaluation.  PLAN FOR NEXT SESSION: RLE and core strengthening. Monitor response to activity and adjust accordingly.     Smith Mcnicholas PT, DPT, LAT, ATC  07/29/22  10:17 AM

## 2022-07-30 NOTE — Therapy (Signed)
OUTPATIENT PHYSICAL THERAPY TREATMENT NOTE   Patient Name: Victor Ramirez MRN: [id number] DOB:[birth year], 67 y.o., male Today's Date: 07/31/2022  END OF SESSION:  PT End of Session - 07/31/22 0939     Visit Number 5    Number of Visits 13    Date for PT Re-Evaluation 08/18/22    Authorization Type [employer] MDC    Progress Note Due on Visit 10    PT Start Time 0940   late check in   PT Stop Time 1015    PT Time Calculation (min) 35 min    Activity Tolerance Patient tolerated treatment well    Behavior During Therapy WFL for tasks assessed/performed                 Past Medical History:  Diagnosis Date   Arthritis    Hemochromatosis    Hypertension    Past Surgical History:  Procedure Laterality Date   ANTERIOR LATERAL LUMBAR FUSION WITH PERCUTANEOUS SCREW 1 LEVEL Right 11/27/2016   Procedure: Lumbar Three-Four Transpsoas Lumbar InterbodyFfusion;  Surgeon: Ditty, Loura Halt, MD;  Location: [facility] OR;  Service: Neurosurgery;  Laterality: Right;  L3-4 Transpsoas lumbar interbody fusion/L3-4 Pedicle screw fixation with posterolateral arthrodesis/minimally invasive decompression/Mazor   APPLICATION OF ROBOTIC ASSISTANCE FOR SPINAL PROCEDURE N/A 11/27/2016   Procedure: Lumbar Three-Four Pedicle Screw Fixation with Posterolateral Arthrodesis with Minimally Invasive Decompression with Application of Robotic Assistance;  Surgeon: Ditty, Loura Halt, MD;  Location: [facility] OR;  Service: Neurosurgery;  Laterality: N/A;   BACK SURGERY     x3    BACK SURGERY     battery surgery    REVERSE SHOULDER ARTHROPLASTY Left 09/16/2021   Procedure: LEFT REVERSE SHOULDER ARTHROPLASTY;  Surgeon: Cammy Copa, MD;  Location: [facility] OR;  Service: Orthopedics;  Laterality: Left;   spinal manipulation under anesthesia     TONSILLECTOMY     Patient Active Problem List   Diagnosis Date Noted   Vitamin D deficiency 10/15/2021   Osteoporosis 10/15/2021   OA (osteoarthritis) of shoulder 09/16/2021    S/P reverse total shoulder arthroplasty, left 09/16/2021   Pain in right wrist 05/12/2021   RUQ abdominal pain    Acute pancreatitis without infection or necrosis 12/02/2020   Pain in left shoulder 11/28/2020   Rheumatoid factor positive 08/29/2020   Polymyalgia rheumatica (HCC) 08/08/2020   Polyarthralgia 08/08/2020   High risk medication use 08/08/2020   Lumbosacral spondylosis with radiculopathy 11/27/2016   Atrophy of muscle of right lower leg 02/04/2014   History of post-polio syndrome 02/02/2014   Pain of left lower extremity 02/02/2014   Chronic back pain 05/14/2013   HTN (hypertension) 05/14/2013   Headache 12/22/2011   Sleep apnea 11/17/2011   Elevation of level of transaminase or lactic acid dehydrogenase (LDH) 09/18/2010   Anxiety 09/08/2010   Tachycardia 09/08/2010   Hyperlipemia 03/01/2006   TIA (transient ischemic attack) 03/01/2006   Tobacco use disorder 03/01/2006   Depressive disorder 10/16/2005    PCP: Wilfrid Lund, PA  REFERRING PROVIDER: Wilfrid Lund, PA  REFERRING DIAG: R53.1 (ICD-10-CM) - Weakness  Rationale for Evaluation and Treatment: Rehabilitation  THERAPY DIAG:  Muscle weakness (generalized)  Other abnormalities of gait and mobility  ONSET DATE: since childhood  SUBJECTIVE:  SUBJECTIVE STATEMENT: No issue after last session, primary report of fatigue and soreness in anterior thighs/hips. Notes his balance seems to be improving, still has some unsteadiness with fatigue.   PERTINENT HISTORY:  HTN, TIA,  hx polio, L reverse TSA, multiple lumbar surgeries (most recent in 2018, fracture in 2023 see MRI below)   PAIN:  Pt w/ hx of MSK issues but states pain is well controlled with current regimen, denies any pain related to this episode  PRECAUTIONS:  None  WEIGHT BEARING RESTRICTIONS: No  FALLS:  Has patient fallen in last 6 months? No  LIVING ENVIRONMENT: 2 story house, avoids stairs due to RLE weakness, uses rails Lives w/ adult son, pt states they help take care of each other Also has a friend who helps with housework/yardwork   OCCUPATION: on disability   PLOF: Independent - active, enjoys [place] hunting   PATIENT GOALS: get stronger, core strengthh  NEXT MD VISIT: August  OBJECTIVE: (objective measures completed at initial evaluation unless otherwise dated)   DIAGNOSTIC FINDINGS:  Sept 2023 lumbar MRI: "IMPRESSION: 1. L1 and L5 superior endplate fractures with mild height loss, new since 2019. Limited associated marrow edema, likely recent but nearly healed. 2. L2-3 progressive adjacent segment degeneration with compressive spinal stenosis and moderate left foraminal narrowing. 3. L3-S1 solid fusion."  PATIENT SURVEYS:  FOTO 46 current, 56 predicted  SCREENING FOR RED FLAGS: Red flag questioning/screening reassuring    COGNITION: Overall cognitive status: Within functional limits for tasks assessed     SENSATION: Light touch intact B LE, does not endorse any neuro complaints  POSTURE: mild lateral shift to L, fwd head posture  PALPATION: Not indicated  LUMBAR ROM:   AROM eval  Flexion   Extension   Right lateral flexion   Left lateral flexion   Right rotation   Left rotation    (Blank rows = not tested) (Key: WFL = within functional limits not formally assessed, * = concordant pain, s = stiffness/stretching sensation, NT = not tested)   LOWER EXTREMITY ROM:     Active  Right eval Left eval  Hip flexion    Hip extension    Hip internal rotation    Hip external rotation    Knee extension    Knee flexion    (Blank rows = not tested) (Key: WFL = within functional limits not formally assessed, * = concordant pain, s = stiffness/stretching sensation, NT = not tested)  Comments:    LOWER  EXTREMITY MMT:    MMT Right eval Left eval  Hip flexion 3+ 4  Hip abduction (modified sitting) 4 5  Hip internal rotation    Hip external rotation    Knee flexion 3+ 5  Knee extension 3+ 5  Ankle dorsiflexion 3- 5   (Blank rows = not tested) (Key: WFL = within functional limits not formally assessed, * = concordant pain, s = stiffness/stretching sensation, NT = not tested)  Comments: MMT painless   FUNCTIONAL TESTS:  5xSTS 16.40 sec no UE support, weight shift to R   BIL calf raise w/ UE balancing support x30 repetitions, increased ROM and weight shift for LLE consistent throughout repetitions, pt reports subjective improvement in ease of movement with repetition  GAIT: Distance walked: within clinic Assistive device utilized: None Level of assistance: Complete Independence Comments: increased weight shift to L, reduced truncal ROM and arm swing, reduced stance time on R  TODAY'S TREATMENT:  [facility] Adult PT Treatment:                                                DATE: 07/31/22 Therapeutic Exercise: Nu step L6 LE only  Superset STS offset (RLE bias) x5, 10# front carry 136ft cues for pacing and mechanics, 3 laps  GTB hip 3 way x8 BIL cues for reduced truncal compensations  Black band bridge + abduction x15 cues for form and reduced lumbar compensations HEP update + education, provided with black band   [facility] Adult PT Treatment:                                                DATE: 07/29/2022 Therapeutic Exercise: Elliptical L5 x 2 min with ramp L4 Nu-step L5 x 2 min LE only  Bosu lunge 2 x 5 reps (switching lead leg at 5 reps) - modified to mini lunge with the RLE  Bridges with heels on black foam roller with glute set x 5 sec hold x 5 reps x 2 sets Unilateral supine clamshell with BTB x 15 Bridge with hip abduction with BTB x 20  Dead bug 5 x 5  sec hold x 2 sets    [facility] Adult PT Treatment:                                                DATE: 07/24/22 Therapeutic Exercise: 5# suitcase carry 3x4ft BIL cues for posture and pacing LAQ 3# 3x12 BIL alternating cues for trunk support  Machine hamstring curl 20# single leg cues for form and control, 3x8 BIL  6 inch runner step up x10 BIL cues for posture and UE support 4 inch runner step up x10 BIL  HEP update + handout/education   [facility] Adult PT Treatment:                                                DATE: 07/22/2022 Therapeutic Exercise: Nu-step L6 x 7 min LE only goal to keep MET at 2.0 or higher to promote endurance  EMOM x 4 sets LAQ x 12 reps with 3# alternating L/R KB squat 12 reps 10# UE ball press seated holding 5 sec each AMRAP (verbal cues to keep arms straight) Hip abduction in sidelying Heel raise with bil UE HHA from chair AMRAP   PATIENT EDUCATION:  Education details: rationale for interventions, HEP Person educated: Patient Education method: Explanation, Demonstration, Tactile cues, Verbal cues, and Handouts Education comprehension: verbalized understanding, returned demonstration, verbal cues required, tactile cues required, and needs further education    HOME EXERCISE PROGRAM: Access Code: [email or web address] URL: [email or web address] Date: 07/31/2022 Prepared by: Fransisco Hertz  Exercises - Heel Raises with Counter Support  - 1 x daily - 7 x weekly - 2 sets - 12 reps - Staggered Sit-to-Stand  - 1 x daily - 7 x weekly - 3 sets - 5 reps - Seated Long Arc Quad  - 1 x daily -  7 x weekly - 3 sets - 10 reps - Supine Bridge  - 1 x daily - 7 x weekly - 2 sets - 12 reps  ASSESSMENT:  CLINICAL IMPRESSION: 07/31/2022 Pt arrives w/o pain, reports BIL hip/thigh soreness after last session. Today focusing on improving RLE functional strength with reduced compensations, as well as functional endurance activities. Given anterior hip/thigh soreness, more focus on  posterior hip strengthening which pt tolerates well, primary report of fatigue. Noted improvement with symmetrical mechanics during offset STS with repetition. No adverse events, tolerates well. HEP update as above. Recommend continuing along current POC in order to address relevant deficits and improve functional tolerance. Pt departs today's session in no acute distress, all voiced questions/concerns addressed appropriately from PT perspective.     EVALUATION: Pt is a pleasant 67 year old gentleman who arrives to PT evaluation on this date for LE weakness. Pt reports difficulty with static standing, stair navigation, balance, and higher level activities due to weakness. During today's session pt demonstrates generalized R LE weakness, altered functional mechanics, and gait impairments which are likely contributing to difficulty with aforementioned activities. Did discuss with pt that we will plan to monitor tolerance to activity given reported hx of polio affecting this limb, and aim to accommodate treatment accordingly although he notes in the past he has responded well to PT/exercise. No adverse events during today's session, pt verbalizes agreement with plan below. Recommend skilled PT to address aforementioned deficits to improve functional independence/tolerance. Pt departs today's session in no acute distress, all voiced questions/concerns addressed appropriately from PT perspective.    OBJECTIVE IMPAIRMENTS: Abnormal gait, decreased activity tolerance, decreased balance, decreased endurance, decreased mobility, difficulty walking, decreased ROM, decreased strength, improper body mechanics, postural dysfunction, and pain.   ACTIVITY LIMITATIONS: carrying, lifting, standing, stairs, transfers, and locomotion level  PARTICIPATION LIMITATIONS: community activity and yard work  PERSONAL FACTORS: Time since onset of injury/illness/exacerbation and 3+ comorbidities: L rTSA, HTN, hx polio, multiple back  surgeries  are also affecting patient's functional outcome.   REHAB POTENTIAL: Good  CLINICAL DECISION MAKING: Stable/uncomplicated  EVALUATION COMPLEXITY: Low   GOALS: Goals reviewed with patient? No  SHORT TERM GOALS: Target date: 07/28/2022 Pt will demonstrate appropriate understanding and performance of initially prescribed HEP in order to facilitate improved independence with management of symptoms.  Baseline: HEP provided on eval Goal status: INITIAL   2. Pt will score greater than or equal to 51 on FOTO in order to demonstrate improved perception of function due to symptoms.  Baseline: 46  Goal status: INITIAL    LONG TERM GOALS: Target date: 08/18/2022  Pt will score 56 on FOTO in order to demonstrate improved perception of functional status due to symptoms.  Baseline: 46 Goal status: INITIAL  2.  Pt will report/demonstrate ability to stand for up to without increase in instability/discomfort in order to improve tolerance to ADLs. Baseline: fatigue/instability after 10-37min Goal status: INITIAL  3.  Pt will demonstrate at least 1pt increase on MMT scale globally throughout RLE in order to promote improved functional strength. Baseline: see MMT chart above Goal status: INITIAL  4. Pt will perform 5xSTS in =/<12 sec in order to demonstrate reduced fall risk and improved functional independence. (MCID of 2.3sec)  Baseline: 16sec no UE support, altered kinematics  Goal status: INITIAL   5. Pt will demonstrate ability to safely navigate a full flight of stairs using unilateral rail and reported RPE <5/10 in order to promote improved home navigation.  Baseline:  states he avoids stairs in home due to difficulty  Goal status: INITIAL   PLAN:  PT FREQUENCY: 2x/week  PT DURATION: 6 weeks  PLANNED INTERVENTIONS: Therapeutic exercises, Therapeutic activity, Neuromuscular re-education, Balance training, Gait training, Patient/Family education, Self Care, Stair  training, Vestibular training, Aquatic Therapy, Cryotherapy, Moist heat, Taping, Manual therapy, and Re-evaluation.  PLAN FOR NEXT SESSION: RLE and core strengthening. Monitor response to activity and adjust accordingly.     Ashley Murrain PT, DPT 07/31/2022 10:53 AM

## 2022-07-31 ENCOUNTER — Ambulatory Visit: Payer: Medicare Other | Admitting: Physical Therapy

## 2022-07-31 ENCOUNTER — Encounter: Payer: Self-pay | Admitting: Physical Therapy

## 2022-07-31 DIAGNOSIS — M6281 Muscle weakness (generalized): Secondary | ICD-10-CM

## 2022-07-31 DIAGNOSIS — R2689 Other abnormalities of gait and mobility: Secondary | ICD-10-CM

## 2022-08-04 ENCOUNTER — Ambulatory Visit: Payer: Medicare Other | Admitting: Physical Therapy

## 2022-08-05 NOTE — Therapy (Signed)
OUTPATIENT PHYSICAL THERAPY TREATMENT NOTE   Patient Name: Victor Ramirez MRN: [id number] DOB:1955/06/03, 67 y.o., male Today's Date: 08/06/2022  END OF SESSION:  PT End of Session - 08/06/22 0845     Visit Number 6    Number of Visits 13    Date for PT Re-Evaluation 08/18/22    Authorization Type [employer] MDC    Progress Note Due on Visit 10    PT Start Time 0845    PT Stop Time 0929    PT Time Calculation (min) 44 min    Activity Tolerance Patient tolerated treatment well    Behavior During Therapy WFL for tasks assessed/performed                  Past Medical History:  Diagnosis Date   Arthritis    Hemochromatosis    Hypertension    Past Surgical History:  Procedure Laterality Date   ANTERIOR LATERAL LUMBAR FUSION WITH PERCUTANEOUS SCREW 1 LEVEL Right 11/27/2016   Procedure: Lumbar Three-Four Transpsoas Lumbar InterbodyFfusion;  Surgeon: Ditty, Loura Halt, MD;  Location: [facility] OR;  Service: Neurosurgery;  Laterality: Right;  L3-4 Transpsoas lumbar interbody fusion/L3-4 Pedicle screw fixation with posterolateral arthrodesis/minimally invasive decompression/Mazor   APPLICATION OF ROBOTIC ASSISTANCE FOR SPINAL PROCEDURE N/A 11/27/2016   Procedure: Lumbar Three-Four Pedicle Screw Fixation with Posterolateral Arthrodesis with Minimally Invasive Decompression with Application of Robotic Assistance;  Surgeon: Ditty, Loura Halt, MD;  Location: [facility] OR;  Service: Neurosurgery;  Laterality: N/A;   BACK SURGERY     x3    BACK SURGERY     battery surgery    REVERSE SHOULDER ARTHROPLASTY Left 09/16/2021   Procedure: LEFT REVERSE SHOULDER ARTHROPLASTY;  Surgeon: Cammy Copa, MD;  Location: [facility] OR;  Service: Orthopedics;  Laterality: Left;   spinal manipulation under anesthesia     TONSILLECTOMY     Patient Active Problem List   Diagnosis Date Noted   Vitamin D deficiency 10/15/2021   Osteoporosis 10/15/2021   OA (osteoarthritis) of shoulder 09/16/2021   S/P  reverse total shoulder arthroplasty, left 09/16/2021   Pain in right wrist 05/12/2021   RUQ abdominal pain    Acute pancreatitis without infection or necrosis 12/02/2020   Pain in left shoulder 11/28/2020   Rheumatoid factor positive 08/29/2020   Polymyalgia rheumatica (HCC) 08/08/2020   Polyarthralgia 08/08/2020   High risk medication use 08/08/2020   Lumbosacral spondylosis with radiculopathy 11/27/2016   Atrophy of muscle of right lower leg 02/04/2014   History of post-polio syndrome 02/02/2014   Pain of left lower extremity 02/02/2014   Chronic back pain 05/14/2013   HTN (hypertension) 05/14/2013   Headache 12/22/2011   Sleep apnea 11/17/2011   Elevation of level of transaminase or lactic acid dehydrogenase (LDH) 09/18/2010   Anxiety 09/08/2010   Tachycardia 09/08/2010   Hyperlipemia 03/01/2006   TIA (transient ischemic attack) 03/01/2006   Tobacco use disorder 03/01/2006   Depressive disorder 10/16/2005    PCP: Wilfrid Lund, PA  REFERRING PROVIDER: Wilfrid Lund, PA  REFERRING DIAG: R53.1 (ICD-10-CM) - Weakness  Rationale for Evaluation and Treatment: Rehabilitation  THERAPY DIAG:  Muscle weakness (generalized)  Other abnormalities of gait and mobility  ONSET DATE: since childhood  SUBJECTIVE:  SUBJECTIVE STATEMENT: No issues after last session, felt good. States he is noticing a good difference in his balance. Not having as many cramps in hamstrings.   PERTINENT HISTORY:  HTN, TIA,  hx polio, L reverse TSA, multiple lumbar surgeries (most recent in 2018, fracture in 2023 see MRI below)   PAIN:  Pt w/ hx of MSK issues but states pain is well controlled with current regimen, denies any pain related to this episode  PRECAUTIONS: None  WEIGHT BEARING RESTRICTIONS:  No  FALLS:  Has patient fallen in last 6 months? No  LIVING ENVIRONMENT: 2 story house, avoids stairs due to RLE weakness, uses rails Lives w/ adult son, pt states they help take care of each other Also has a friend who helps with housework/yardwork   OCCUPATION: on disability   PLOF: Independent - active, enjoys [place] hunting   PATIENT GOALS: get stronger, core strengthh  NEXT MD VISIT: August  OBJECTIVE: (objective measures completed at initial evaluation unless otherwise dated)   DIAGNOSTIC FINDINGS:  Sept 2023 lumbar MRI: "IMPRESSION: 1. L1 and L5 superior endplate fractures with mild height loss, new since 2019. Limited associated marrow edema, likely recent but nearly healed. 2. L2-3 progressive adjacent segment degeneration with compressive spinal stenosis and moderate left foraminal narrowing. 3. L3-S1 solid fusion."  PATIENT SURVEYS:  FOTO 46 current, 56 predicted FOTO 08/06/22: 56 MET  SCREENING FOR RED FLAGS: Red flag questioning/screening reassuring    COGNITION: Overall cognitive status: Within functional limits for tasks assessed     SENSATION: Light touch intact B LE, does not endorse any neuro complaints  POSTURE: mild lateral shift to L, fwd head posture  PALPATION: Not indicated  LUMBAR ROM:   AROM eval  Flexion   Extension   Right lateral flexion   Left lateral flexion   Right rotation   Left rotation    (Blank rows = not tested) (Key: WFL = within functional limits not formally assessed, * = concordant pain, s = stiffness/stretching sensation, NT = not tested)   LOWER EXTREMITY ROM:     Active  Right eval Left eval  Hip flexion    Hip extension    Hip internal rotation    Hip external rotation    Knee extension    Knee flexion    (Blank rows = not tested) (Key: WFL = within functional limits not formally assessed, * = concordant pain, s = stiffness/stretching sensation, NT = not tested)  Comments:    LOWER EXTREMITY MMT:     MMT Right eval Left eval  Hip flexion 3+ 4  Hip abduction (modified sitting) 4 5  Hip internal rotation    Hip external rotation    Knee flexion 3+ 5  Knee extension 3+ 5  Ankle dorsiflexion 3- 5   (Blank rows = not tested) (Key: WFL = within functional limits not formally assessed, * = concordant pain, s = stiffness/stretching sensation, NT = not tested)  Comments: MMT painless   FUNCTIONAL TESTS:  5xSTS 16.40 sec no UE support, weight shift to R   BIL calf raise w/ UE balancing support x30 repetitions, increased ROM and weight shift for LLE consistent throughout repetitions, pt reports subjective improvement in ease of movement with repetition  GAIT: Distance walked: within clinic Assistive device utilized: None Level of assistance: Complete Independence Comments: increased weight shift to L, reduced truncal ROM and arm swing, reduced stance time on R  TODAY'S TREATMENT:  [facility] Adult PT Treatment:                                                DATE: 08/06/22 Therapeutic Exercise: Single leg hamstring curl machine 25# RLE only, x9, x12 Cybex hip extension 12.5# x8, x10 BIL cues for posture Mini lunges at counter with UE x8 RLE fwd cues for comfortable ROM  and safety  HEP update + education  Therapeutic Activity: FOTO + education 8# suitcase carry 3x61ft cues for posture and pacing STS from mat with LLE on 2inch step for improved weightshifting with transfers, x8 BW, 2x6 w 10#    [facility] Adult PT Treatment:                                                DATE: 07/31/22 Therapeutic Exercise: Nu step L6 LE only  Superset STS offset (RLE bias) x5, 10# front carry 145ft cues for pacing and mechanics, 3 laps  GTB hip 3 way x8 BIL cues for reduced truncal compensations  Black band bridge + abduction x15 cues for form and reduced lumbar  compensations HEP update + education, provided with black band   [facility] Adult PT Treatment:                                                DATE: 07/29/2022 Therapeutic Exercise: Elliptical L5 x 2 min with ramp L4 Nu-step L5 x 2 min LE only  Bosu lunge 2 x 5 reps (switching lead leg at 5 reps) - modified to mini lunge with the RLE  Bridges with heels on black foam roller with glute set x 5 sec hold x 5 reps x 2 sets Unilateral supine clamshell with BTB x 15 Bridge with hip abduction with BTB x 20  Dead bug 5 x 5 sec hold x 2 sets    [facility] Adult PT Treatment:                                                DATE: 07/24/22 Therapeutic Exercise: 5# suitcase carry 3x71ft BIL cues for posture and pacing LAQ 3# 3x12 BIL alternating cues for trunk support  Machine hamstring curl 20# single leg cues for form and control, 3x8 BIL  6 inch runner step up x10 BIL cues for posture and UE support 4 inch runner step up x10 BIL  HEP update + handout/education   [facility] Adult PT Treatment:                                                DATE: 07/22/2022 Therapeutic Exercise: Nu-step L6 x 7 min LE only goal to keep MET at 2.0 or higher to promote endurance  EMOM x 4 sets LAQ x 12 reps with 3# alternating L/R KB squat 12 reps 10# UE ball press seated  holding 5 sec each AMRAP (verbal cues to keep arms straight) Hip abduction in sidelying Heel raise with bil UE HHA from chair AMRAP   PATIENT EDUCATION:  Education details: rationale for interventions, HEP, FOTO  Person educated: Patient Education method: Explanation, Demonstration, Tactile cues, Verbal cues, and Handouts Education comprehension: verbalized understanding, returned demonstration, verbal cues required, tactile cues required, and needs further education    HOME EXERCISE PROGRAM: Access Code: [email or web address] URL: [email or web address] Date: 08/06/2022 Prepared by: Fransisco Hertz  Exercises - Heel Raises with Counter Support  - 1 x  daily - 7 x weekly - 2 sets - 12 reps - Staggered Sit-to-Stand  - 1 x daily - 7 x weekly - 3 sets - 5 reps - Seated Long Arc Quad  - 1 x daily - 7 x weekly - 3 sets - 10 reps - Standing 3-Way Leg Reach with Resistance at Ankles and Counter Support  - 1 x daily - 7 x weekly - 3 sets - 8 reps  ASSESSMENT:  CLINICAL IMPRESSION: 08/06/2022 Pt arrives w/o pain, notes improving balance and core strength with typical activities. FOTO goal is met today. Continuing to progress strength/endurance training as tolerated and functional mobility with emphasis on mechanics as pt continues to demo tendency to compensate with LLE. Pt tolerates well, most difficulty w/ fwd lunges although this improves with repetition and cues. No adverse events, departs with no pain but mild anterior hip soreness. Pt departs today's session in no acute distress, all voiced questions/concerns addressed appropriately from PT perspective.        EVALUATION: Pt is a pleasant 67 year old gentleman who arrives to PT evaluation on this date for LE weakness. Pt reports difficulty with static standing, stair navigation, balance, and higher level activities due to weakness. During today's session pt demonstrates generalized R LE weakness, altered functional mechanics, and gait impairments which are likely contributing to difficulty with aforementioned activities. Did discuss with pt that we will plan to monitor tolerance to activity given reported hx of polio affecting this limb, and aim to accommodate treatment accordingly although he notes in the past he has responded well to PT/exercise. No adverse events during today's session, pt verbalizes agreement with plan below. Recommend skilled PT to address aforementioned deficits to improve functional independence/tolerance. Pt departs today's session in no acute distress, all voiced questions/concerns addressed appropriately from PT perspective.    OBJECTIVE IMPAIRMENTS: Abnormal gait, decreased  activity tolerance, decreased balance, decreased endurance, decreased mobility, difficulty walking, decreased ROM, decreased strength, improper body mechanics, postural dysfunction, and pain.   ACTIVITY LIMITATIONS: carrying, lifting, standing, stairs, transfers, and locomotion level  PARTICIPATION LIMITATIONS: community activity and yard work  PERSONAL FACTORS: Time since onset of injury/illness/exacerbation and 3+ comorbidities: L rTSA, HTN, hx polio, multiple back surgeries  are also affecting patient's functional outcome.   REHAB POTENTIAL: Good  CLINICAL DECISION MAKING: Stable/uncomplicated  EVALUATION COMPLEXITY: Low   GOALS: Goals reviewed with patient? No  SHORT TERM GOALS: Target date: 07/28/2022 Pt will demonstrate appropriate understanding and performance of initially prescribed HEP in order to facilitate improved independence with management of symptoms.  Baseline: HEP provided on eval 08/06/22: good adherence reported  Goal status: MET   2. Pt will score greater than or equal to 51 on FOTO in order to demonstrate improved perception of function due to symptoms.  Baseline: 46  08/06/22: 56   Goal status: MET  LONG TERM GOALS: Target date: 08/18/2022  Pt will score 56 on FOTO in order  to demonstrate improved perception of functional status due to symptoms.  Baseline: 46 Goal status: INITIAL  2.  Pt will report/demonstrate ability to stand for up to without increase in instability/discomfort in order to improve tolerance to ADLs. Baseline: fatigue/instability after 10-68min Goal status: INITIAL  3.  Pt will demonstrate at least 1pt increase on MMT scale globally throughout RLE in order to promote improved functional strength. Baseline: see MMT chart above Goal status: INITIAL  4. Pt will perform 5xSTS in =/<12 sec in order to demonstrate reduced fall risk and improved functional independence. (MCID of 2.3sec)  Baseline: 16sec no UE support, altered  kinematics  Goal status: INITIAL   5. Pt will demonstrate ability to safely navigate a full flight of stairs using unilateral rail and reported RPE <5/10 in order to promote improved home navigation.  Baseline: states he avoids stairs in home due to difficulty  Goal status: INITIAL   PLAN:  PT FREQUENCY: 2x/week  PT DURATION: 6 weeks  PLANNED INTERVENTIONS: Therapeutic exercises, Therapeutic activity, Neuromuscular re-education, Balance training, Gait training, Patient/Family education, Self Care, Stair training, Vestibular training, Aquatic Therapy, Cryotherapy, Moist heat, Taping, Manual therapy, and Re-evaluation.  PLAN FOR NEXT SESSION: RLE and core strengthening. Monitor response to activity and adjust accordingly.      Ashley Murrain PT, DPT 08/06/2022 10:39 AM

## 2022-08-06 ENCOUNTER — Ambulatory Visit: Payer: Medicare Other | Attending: Family Medicine | Admitting: Physical Therapy

## 2022-08-06 ENCOUNTER — Encounter: Payer: Self-pay | Admitting: Physical Therapy

## 2022-08-06 DIAGNOSIS — R2689 Other abnormalities of gait and mobility: Secondary | ICD-10-CM | POA: Insufficient documentation

## 2022-08-06 DIAGNOSIS — G8929 Other chronic pain: Secondary | ICD-10-CM | POA: Insufficient documentation

## 2022-08-06 DIAGNOSIS — M6281 Muscle weakness (generalized): Secondary | ICD-10-CM | POA: Insufficient documentation

## 2022-08-06 DIAGNOSIS — M25512 Pain in left shoulder: Secondary | ICD-10-CM | POA: Insufficient documentation

## 2022-08-07 NOTE — Therapy (Signed)
OUTPATIENT PHYSICAL THERAPY TREATMENT NOTE   Patient Name: Victor Ramirez MRN: [id number] DOB:[birth year], 67 y.o., male Today's Date: 08/07/2022  END OF SESSION:         Past Medical History:  Diagnosis Date   Arthritis    Hemochromatosis    Hypertension    Past Surgical History:  Procedure Laterality Date   ANTERIOR LATERAL LUMBAR FUSION WITH PERCUTANEOUS SCREW 1 LEVEL Right 11/27/2016   Procedure: Lumbar Three-Four Transpsoas Lumbar InterbodyFfusion;  Surgeon: Ditty, Loura Halt, MD;  Location: [facility] OR;  Service: Neurosurgery;  Laterality: Right;  L3-4 Transpsoas lumbar interbody fusion/L3-4 Pedicle screw fixation with posterolateral arthrodesis/minimally invasive decompression/Mazor   APPLICATION OF ROBOTIC ASSISTANCE FOR SPINAL PROCEDURE N/A 11/27/2016   Procedure: Lumbar Three-Four Pedicle Screw Fixation with Posterolateral Arthrodesis with Minimally Invasive Decompression with Application of Robotic Assistance;  Surgeon: Ditty, Loura Halt, MD;  Location: [facility] OR;  Service: Neurosurgery;  Laterality: N/A;   BACK SURGERY     x3    BACK SURGERY     battery surgery    REVERSE SHOULDER ARTHROPLASTY Left 09/16/2021   Procedure: LEFT REVERSE SHOULDER ARTHROPLASTY;  Surgeon: Cammy Copa, MD;  Location: [facility] OR;  Service: Orthopedics;  Laterality: Left;   spinal manipulation under anesthesia     TONSILLECTOMY     Patient Active Problem List   Diagnosis Date Noted   Vitamin D deficiency 10/15/2021   Osteoporosis 10/15/2021   OA (osteoarthritis) of shoulder 09/16/2021   S/P reverse total shoulder arthroplasty, left 09/16/2021   Pain in right wrist 05/12/2021   RUQ abdominal pain    Acute pancreatitis without infection or necrosis 12/02/2020   Pain in left shoulder 11/28/2020   Rheumatoid factor positive 08/29/2020   Polymyalgia rheumatica (HCC) 08/08/2020   Polyarthralgia 08/08/2020   High risk medication use 08/08/2020   Lumbosacral spondylosis with  radiculopathy 11/27/2016   Atrophy of muscle of right lower leg 02/04/2014   History of post-polio syndrome 02/02/2014   Pain of left lower extremity 02/02/2014   Chronic back pain 05/14/2013   HTN (hypertension) 05/14/2013   Headache 12/22/2011   Sleep apnea 11/17/2011   Elevation of level of transaminase or lactic acid dehydrogenase (LDH) 09/18/2010   Anxiety 09/08/2010   Tachycardia 09/08/2010   Hyperlipemia 03/01/2006   TIA (transient ischemic attack) 03/01/2006   Tobacco use disorder 03/01/2006   Depressive disorder 10/16/2005    PCP: Wilfrid Lund, PA  REFERRING PROVIDER: Wilfrid Lund, PA  REFERRING DIAG: R53.1 (ICD-10-CM) - Weakness  Rationale for Evaluation and Treatment: Rehabilitation  THERAPY DIAG:  No diagnosis found.  ONSET DATE: since childhood  SUBJECTIVE:  SUBJECTIVE STATEMENT: ***  *** No issues after last session, felt good. States he is noticing a good difference in his balance. Not having as many cramps in hamstrings.   PERTINENT HISTORY:  HTN, TIA,  hx polio, L reverse TSA, multiple lumbar surgeries (most recent in 2018, fracture in 2023 see MRI below)   PAIN:  Pt w/ hx of MSK issues but states pain is well controlled with current regimen, denies any pain related to this episode  PRECAUTIONS: None  WEIGHT BEARING RESTRICTIONS: No  FALLS:  Has patient fallen in last 6 months? No  LIVING ENVIRONMENT: 2 story house, avoids stairs due to RLE weakness, uses rails Lives w/ adult son, pt states they help take care of each other Also has a friend who helps with housework/yardwork   OCCUPATION: on disability   PLOF: Independent - active, enjoys [place] hunting   PATIENT GOALS: get stronger, core strengthh  NEXT MD VISIT: August  OBJECTIVE: (objective  measures completed at initial evaluation unless otherwise dated)   DIAGNOSTIC FINDINGS:  Sept 2023 lumbar MRI: "IMPRESSION: 1. L1 and L5 superior endplate fractures with mild height loss, new since 2019. Limited associated marrow edema, likely recent but nearly healed. 2. L2-3 progressive adjacent segment degeneration with compressive spinal stenosis and moderate left foraminal narrowing. 3. L3-S1 solid fusion."  PATIENT SURVEYS:  FOTO 46 current, 56 predicted FOTO 08/06/22: 56 MET  SCREENING FOR RED FLAGS: Red flag questioning/screening reassuring    COGNITION: Overall cognitive status: Within functional limits for tasks assessed     SENSATION: Light touch intact B LE, does not endorse any neuro complaints  POSTURE: mild lateral shift to L, fwd head posture  PALPATION: Not indicated  LUMBAR ROM:   AROM eval  Flexion   Extension   Right lateral flexion   Left lateral flexion   Right rotation   Left rotation    (Blank rows = not tested) (Key: WFL = within functional limits not formally assessed, * = concordant pain, s = stiffness/stretching sensation, NT = not tested)   LOWER EXTREMITY ROM:     Active  Right eval Left eval  Hip flexion    Hip extension    Hip internal rotation    Hip external rotation    Knee extension    Knee flexion    (Blank rows = not tested) (Key: WFL = within functional limits not formally assessed, * = concordant pain, s = stiffness/stretching sensation, NT = not tested)  Comments:    LOWER EXTREMITY MMT:    MMT Right eval Left eval  Hip flexion 3+ 4  Hip abduction (modified sitting) 4 5  Hip internal rotation    Hip external rotation    Knee flexion 3+ 5  Knee extension 3+ 5  Ankle dorsiflexion 3- 5   (Blank rows = not tested) (Key: WFL = within functional limits not formally assessed, * = concordant pain, s = stiffness/stretching sensation, NT = not tested)  Comments: MMT painless   FUNCTIONAL TESTS:  5xSTS 16.40 sec  no UE support, weight shift to R   BIL calf raise w/ UE balancing support x30 repetitions, increased ROM and weight shift for LLE consistent throughout repetitions, pt reports subjective improvement in ease of movement with repetition  GAIT: Distance walked: within clinic Assistive device utilized: None Level of assistance: Complete Independence Comments: increased weight shift to L, reduced truncal ROM and arm swing, reduced stance time on R  TODAY'S TREATMENT:  [facility] Adult PT Treatment:                                                DATE: 08/10/22 Therapeutic Exercise: *** Manual Therapy: *** Neuromuscular re-ed: *** Therapeutic Activity: *** Modalities: *** Self Care: ***   [facility] Treatment:                                                DATE: 08/06/22 Therapeutic Exercise: Single leg hamstring curl machine 25# RLE only, x9, x12 Cybex hip extension 12.5# x8, x10 BIL cues for posture Mini lunges at counter with UE x8 RLE fwd cues for comfortable ROM  and safety  HEP update + education  Therapeutic Activity: FOTO + education 8# suitcase carry 3x75ft cues for posture and pacing STS from mat with LLE on 2inch step for improved weightshifting with transfers, x8 BW, 2x6 w 10#    [facility] Adult PT Treatment:                                                DATE: 07/31/22 Therapeutic Exercise: Nu step L6 LE only  Superset STS offset (RLE bias) x5, 10# front carry 125ft cues for pacing and mechanics, 3 laps  GTB hip 3 way x8 BIL cues for reduced truncal compensations  Black band bridge + abduction x15 cues for form and reduced lumbar compensations HEP update + education, provided with black band   [facility] Adult PT Treatment:                                                DATE: 07/29/2022 Therapeutic Exercise: Elliptical L5 x 2 min with ramp L4 Nu-step  L5 x 2 min LE only  Bosu lunge 2 x 5 reps (switching lead leg at 5 reps) - modified to mini lunge with the RLE  Bridges with heels on black foam roller with glute set x 5 sec hold x 5 reps x 2 sets Unilateral supine clamshell with BTB x 15 Bridge with hip abduction with BTB x 20  Dead bug 5 x 5 sec hold x 2 sets    [facility] Adult PT Treatment:                                                DATE: 07/24/22 Therapeutic Exercise: 5# suitcase carry 3x49ft BIL cues for posture and pacing LAQ 3# 3x12 BIL alternating cues for trunk support  Machine hamstring curl 20# single leg cues for form and control, 3x8 BIL  6 inch runner step up x10 BIL cues for posture and UE support 4 inch runner step up x10 BIL  HEP update + handout/education   [facility] Adult PT Treatment:  DATE: 07/22/2022 Therapeutic Exercise: Nu-step L6 x 7 min LE only goal to keep MET at 2.0 or higher to promote endurance  EMOM x 4 sets LAQ x 12 reps with 3# alternating L/R KB squat 12 reps 10# UE ball press seated holding 5 sec each AMRAP (verbal cues to keep arms straight) Hip abduction in sidelying Heel raise with bil UE HHA from chair AMRAP   PATIENT EDUCATION:  Education details: rationale for interventions, HEP Person educated: Patient Education method: Explanation, Demonstration, Tactile cues, Verbal cues, and Handouts Education comprehension: verbalized understanding, returned demonstration, verbal cues required, tactile cues required, and needs further education    HOME EXERCISE PROGRAM: Access Code: [email or web address] URL: [email or web address] Date: 08/06/2022 Prepared by: Fransisco Hertz  Exercises - Heel Raises with Counter Support  - 1 x daily - 7 x weekly - 2 sets - 12 reps - Staggered Sit-to-Stand  - 1 x daily - 7 x weekly - 3 sets - 5 reps - Seated Long Arc Quad  - 1 x daily - 7 x weekly - 3 sets - 10 reps - Standing 3-Way Leg Reach with Resistance at Ankles  and Counter Support  - 1 x daily - 7 x weekly - 3 sets - 8 reps  ASSESSMENT:  CLINICAL IMPRESSION: 08/07/2022 ***  *** Pt arrives w/o pain, notes improving balance and core strength with typical activities. FOTO goal is met today. Continuing to progress strength/endurance training as tolerated and functional mobility with emphasis on mechanics as pt continues to demo tendency to compensate with LLE. Pt tolerates well, most difficulty w/ fwd lunges although this improves with repetition and cues. No adverse events, departs with no pain but mild anterior hip soreness. Pt departs today's session in no acute distress, all voiced questions/concerns addressed appropriately from PT perspective.        EVALUATION: Pt is a pleasant 67 year old gentleman who arrives to PT evaluation on this date for LE weakness. Pt reports difficulty with static standing, stair navigation, balance, and higher level activities due to weakness. During today's session pt demonstrates generalized R LE weakness, altered functional mechanics, and gait impairments which are likely contributing to difficulty with aforementioned activities. Did discuss with pt that we will plan to monitor tolerance to activity given reported hx of polio affecting this limb, and aim to accommodate treatment accordingly although he notes in the past he has responded well to PT/exercise. No adverse events during today's session, pt verbalizes agreement with plan below. Recommend skilled PT to address aforementioned deficits to improve functional independence/tolerance. Pt departs today's session in no acute distress, all voiced questions/concerns addressed appropriately from PT perspective.    OBJECTIVE IMPAIRMENTS: Abnormal gait, decreased activity tolerance, decreased balance, decreased endurance, decreased mobility, difficulty walking, decreased ROM, decreased strength, improper body mechanics, postural dysfunction, and pain.   ACTIVITY LIMITATIONS:  carrying, lifting, standing, stairs, transfers, and locomotion level  PARTICIPATION LIMITATIONS: community activity and yard work  PERSONAL FACTORS: Time since onset of injury/illness/exacerbation and 3+ comorbidities: L rTSA, HTN, hx polio, multiple back surgeries  are also affecting patient's functional outcome.   REHAB POTENTIAL: Good  CLINICAL DECISION MAKING: Stable/uncomplicated  EVALUATION COMPLEXITY: Low   GOALS: Goals reviewed with patient? No  SHORT TERM GOALS: Target date: 07/28/2022 Pt will demonstrate appropriate understanding and performance of initially prescribed HEP in order to facilitate improved independence with management of symptoms.  Baseline: HEP provided on eval 08/06/22: good adherence reported  Goal status: MET   2. Pt will score greater  than or equal to 51 on FOTO in order to demonstrate improved perception of function due to symptoms.  Baseline: 46  08/06/22: 56   Goal status: MET  LONG TERM GOALS: Target date: 08/18/2022  Pt will score 56 on FOTO in order to demonstrate improved perception of functional status due to symptoms.  Baseline: 46 Goal status: INITIAL  2.  Pt will report/demonstrate ability to stand for up to without increase in instability/discomfort in order to improve tolerance to ADLs. Baseline: fatigue/instability after 10-14min Goal status: INITIAL  3.  Pt will demonstrate at least 1pt increase on MMT scale globally throughout RLE in order to promote improved functional strength. Baseline: see MMT chart above Goal status: INITIAL  4. Pt will perform 5xSTS in =/<12 sec in order to demonstrate reduced fall risk and improved functional independence. (MCID of 2.3sec)  Baseline: 16sec no UE support, altered kinematics  Goal status: INITIAL   5. Pt will demonstrate ability to safely navigate a full flight of stairs using unilateral rail and reported RPE <5/10 in order to promote improved home navigation.  Baseline: states he avoids  stairs in home due to difficulty  Goal status: INITIAL   PLAN:  PT FREQUENCY: 2x/week  PT DURATION: 6 weeks  PLANNED INTERVENTIONS: Therapeutic exercises, Therapeutic activity, Neuromuscular re-education, Balance training, Gait training, Patient/Family education, Self Care, Stair training, Vestibular training, Aquatic Therapy, Cryotherapy, Moist heat, Taping, Manual therapy, and Re-evaluation.  PLAN FOR NEXT SESSION: RLE and core strengthening. Monitor response to activity and adjust accordingly.  ***     Ashley Murrain PT, DPT 08/07/2022 8:08 AM

## 2022-08-10 ENCOUNTER — Ambulatory Visit: Payer: Medicare Other | Admitting: Physical Therapy

## 2022-08-10 ENCOUNTER — Encounter: Payer: Self-pay | Admitting: Physical Therapy

## 2022-08-10 DIAGNOSIS — M6281 Muscle weakness (generalized): Secondary | ICD-10-CM

## 2022-08-10 DIAGNOSIS — G8929 Other chronic pain: Secondary | ICD-10-CM | POA: Diagnosis not present

## 2022-08-10 DIAGNOSIS — R2689 Other abnormalities of gait and mobility: Secondary | ICD-10-CM | POA: Diagnosis not present

## 2022-08-10 DIAGNOSIS — M25512 Pain in left shoulder: Secondary | ICD-10-CM | POA: Diagnosis not present

## 2022-08-11 ENCOUNTER — Encounter: Payer: Medicare Other | Admitting: Vascular Surgery

## 2022-08-12 ENCOUNTER — Ambulatory Visit: Payer: Medicare Other | Admitting: Physical Therapy

## 2022-08-12 DIAGNOSIS — G8929 Other chronic pain: Secondary | ICD-10-CM | POA: Diagnosis not present

## 2022-08-12 DIAGNOSIS — R2689 Other abnormalities of gait and mobility: Secondary | ICD-10-CM

## 2022-08-12 DIAGNOSIS — M6281 Muscle weakness (generalized): Secondary | ICD-10-CM | POA: Diagnosis not present

## 2022-08-12 DIAGNOSIS — M25512 Pain in left shoulder: Secondary | ICD-10-CM | POA: Diagnosis not present

## 2022-08-12 NOTE — Therapy (Signed)
OUTPATIENT PHYSICAL THERAPY TREATMENT NOTE   Patient Name: Victor Ramirez MRN: [id number] DOB:02-Feb-1956, 67 y.o., male Today's Date: 08/12/2022  END OF SESSION:  PT End of Session - 08/12/22 0935     Visit Number 8    Number of Visits 13    Date for PT Re-Evaluation 08/18/22    Authorization Type [employer] MDC    PT Start Time 0933    PT Stop Time 1013    PT Time Calculation (min) 40 min    Activity Tolerance Patient tolerated treatment well    Behavior During Therapy WFL for tasks assessed/performed                    Past Medical History:  Diagnosis Date   Arthritis    Hemochromatosis    Hypertension    Past Surgical History:  Procedure Laterality Date   ANTERIOR LATERAL LUMBAR FUSION WITH PERCUTANEOUS SCREW 1 LEVEL Right 11/27/2016   Procedure: Lumbar Three-Four Transpsoas Lumbar InterbodyFfusion;  Surgeon: Ditty, Loura Halt, MD;  Location: [facility] OR;  Service: Neurosurgery;  Laterality: Right;  L3-4 Transpsoas lumbar interbody fusion/L3-4 Pedicle screw fixation with posterolateral arthrodesis/minimally invasive decompression/Mazor   APPLICATION OF ROBOTIC ASSISTANCE FOR SPINAL PROCEDURE N/A 11/27/2016   Procedure: Lumbar Three-Four Pedicle Screw Fixation with Posterolateral Arthrodesis with Minimally Invasive Decompression with Application of Robotic Assistance;  Surgeon: Ditty, Loura Halt, MD;  Location: [facility] OR;  Service: Neurosurgery;  Laterality: N/A;   BACK SURGERY     x3    BACK SURGERY     battery surgery    REVERSE SHOULDER ARTHROPLASTY Left 09/16/2021   Procedure: LEFT REVERSE SHOULDER ARTHROPLASTY;  Surgeon: Cammy Copa, MD;  Location: [facility] OR;  Service: Orthopedics;  Laterality: Left;   spinal manipulation under anesthesia     TONSILLECTOMY     Patient Active Problem List   Diagnosis Date Noted   Vitamin D deficiency 10/15/2021   Osteoporosis 10/15/2021   OA (osteoarthritis) of shoulder 09/16/2021   S/P reverse total shoulder  arthroplasty, left 09/16/2021   Pain in right wrist 05/12/2021   RUQ abdominal pain    Acute pancreatitis without infection or necrosis 12/02/2020   Pain in left shoulder 11/28/2020   Rheumatoid factor positive 08/29/2020   Polymyalgia rheumatica (HCC) 08/08/2020   Polyarthralgia 08/08/2020   High risk medication use 08/08/2020   Lumbosacral spondylosis with radiculopathy 11/27/2016   Atrophy of muscle of right lower leg 02/04/2014   History of post-polio syndrome 02/02/2014   Pain of left lower extremity 02/02/2014   Chronic back pain 05/14/2013   HTN (hypertension) 05/14/2013   Headache 12/22/2011   Sleep apnea 11/17/2011   Elevation of level of transaminase or lactic acid dehydrogenase (LDH) 09/18/2010   Anxiety 09/08/2010   Tachycardia 09/08/2010   Hyperlipemia 03/01/2006   TIA (transient ischemic attack) 03/01/2006   Tobacco use disorder 03/01/2006   Depressive disorder 10/16/2005    PCP: Wilfrid Lund, PA  REFERRING PROVIDER: Wilfrid Lund, PA  REFERRING DIAG: R53.1 (ICD-10-CM) - Weakness  Rationale for Evaluation and Treatment: Rehabilitation  THERAPY DIAG:  Muscle weakness (generalized)  Other abnormalities of gait and mobility  Chronic left shoulder pain  ONSET DATE: since childhood  SUBJECTIVE:  SUBJECTIVE STATEMENT: "No pain or issues, feeling gradual strength and improvement."   PERTINENT HISTORY:  HTN, TIA,  hx polio, L reverse TSA, multiple lumbar surgeries (most recent in 2018, fracture in 2023 see MRI below)   PAIN:  Pt w/ hx of MSK issues but states pain is well controlled with current regimen, denies any pain related to this episode  PRECAUTIONS: None  WEIGHT BEARING RESTRICTIONS: No  FALLS:  Has patient fallen in last 6 months? No  LIVING  ENVIRONMENT: 2 story house, avoids stairs due to RLE weakness, uses rails Lives w/ adult son, pt states they help take care of each other Also has a friend who helps with housework/yardwork   OCCUPATION: on disability   PLOF: Independent - active, enjoys [place] hunting   PATIENT GOALS: get stronger, core strengthh  NEXT MD VISIT: August  OBJECTIVE: (objective measures completed at initial evaluation unless otherwise dated)   DIAGNOSTIC FINDINGS:  Sept 2023 lumbar MRI: "IMPRESSION: 1. L1 and L5 superior endplate fractures with mild height loss, new since 2019. Limited associated marrow edema, likely recent but nearly healed. 2. L2-3 progressive adjacent segment degeneration with compressive spinal stenosis and moderate left foraminal narrowing. 3. L3-S1 solid fusion."  PATIENT SURVEYS:  FOTO 46 current, 56 predicted FOTO 08/06/22: 56 MET  SCREENING FOR RED FLAGS: Red flag questioning/screening reassuring    COGNITION: Overall cognitive status: Within functional limits for tasks assessed     SENSATION: Light touch intact B LE, does not endorse any neuro complaints  POSTURE: mild lateral shift to L, fwd head posture  PALPATION: Not indicated  LUMBAR ROM:   AROM eval  Flexion   Extension   Right lateral flexion   Left lateral flexion   Right rotation   Left rotation    (Blank rows = not tested) (Key: WFL = within functional limits not formally assessed, * = concordant pain, s = stiffness/stretching sensation, NT = not tested)   LOWER EXTREMITY ROM:     Active  Right eval Left eval  Hip flexion    Hip extension    Hip internal rotation    Hip external rotation    Knee extension    Knee flexion    (Blank rows = not tested) (Key: WFL = within functional limits not formally assessed, * = concordant pain, s = stiffness/stretching sensation, NT = not tested)  Comments:    LOWER EXTREMITY MMT:    MMT Right eval Left eval  Hip flexion 3+ 4  Hip abduction  (modified sitting) 4 5  Hip internal rotation    Hip external rotation    Knee flexion 3+ 5  Knee extension 3+ 5  Ankle dorsiflexion 3- 5   (Blank rows = not tested) (Key: WFL = within functional limits not formally assessed, * = concordant pain, s = stiffness/stretching sensation, NT = not tested)  Comments: MMT painless   FUNCTIONAL TESTS:  5xSTS 16.40 sec no UE support, weight shift to R   BIL calf raise w/ UE balancing support x30 repetitions, increased ROM and weight shift for LLE consistent throughout repetitions, pt reports subjective improvement in ease of movement with repetition  GAIT: Distance walked: within clinic Assistive device utilized: None Level of assistance: Complete Independence Comments: increased weight shift to L, reduced truncal ROM and arm swing, reduced stance time on R  TODAY'S TREATMENT:  [facility] Adult PT Treatment:                                                DATE: 08/12/22 Therapeutic Exercise: Nu-step L8 x 7 min goal of keeping METS at 4.0 Knee extension 2 x going to fatigue 15# bil LE Knee flexion 2 x going to fatigue 20# Cybex Leg press 2 x going to fatigue 50# bil LE Seated on cybex - marching alternating L/R 2 x 20, Palloff press with GTB 2 x 20    [facility] Adult PT Treatment:                                                DATE: 08/10/22 Therapeutic Exercise: Nu step 4 min alternating slow/quick LE/UE for endurance Seated hamstring curl machine 20# x13, x14 cues for pacing  Seated knee extension machine 5# 2x5 cues for pacing and comfortable ROM  Heel raises at counter x30 cues for form and reduced truncal compensations  Therapeutic Activity: 8# suitcase carry 3x37ft BIL UE cues for pacing  5# carry standalone 1/2 steps 2x5 laps reciprocal gait, cues for pacing  Education/discussion re: symptom behavior since starting  therapy as it pertains to functional mobility/tolerance    [facility] Adult PT Treatment:                                                DATE: 08/06/22 Therapeutic Exercise: Single leg hamstring curl machine 25# RLE only, x9, x12 Cybex hip extension 12.5# x8, x10 BIL cues for posture Mini lunges at counter with UE x8 RLE fwd cues for comfortable ROM  and safety  HEP update + education  Therapeutic Activity: FOTO + education 8# suitcase carry 3x46ft cues for posture and pacing STS from mat with LLE on 2inch step for improved weightshifting with transfers, x8 BW, 2x6 w 10#  PATIENT EDUCATION:  Education details: rationale for interventions, HEP Person educated: Patient Education method: Explanation, Demonstration, Tactile cues, Verbal cues, and Handouts Education comprehension: verbalized understanding, returned demonstration, verbal cues required, tactile cues required, and needs further education    HOME EXERCISE PROGRAM: Access Code: [email or web address] URL: [email or web address] Date: 08/06/2022 Prepared by: Fransisco Hertz  Exercises - Heel Raises with Counter Support  - 1 x daily - 7 x weekly - 2 sets - 12 reps - Staggered Sit-to-Stand  - 1 x daily - 7 x weekly - 3 sets - 5 reps - Seated Long Arc Quad  - 1 x daily - 7 x weekly - 3 sets - 10 reps - Standing 3-Way Leg Reach with Resistance at Ankles and Counter Support  - 1 x daily - 7 x weekly - 3 sets - 8 reps  ASSESSMENT:  CLINICAL IMPRESSION: 08/12/2022 Mrs Merleen Milliner arrives to session with continued report of no pain or issues and additionally reports he feels that he is beginning to see progress with strength and is doing more. Continued working on LE strengthening with increased reps to maximize endurance as well as strength. Finished session with seated core activation on unstable surface which he visually demonstrated moderate postural sway but  was able to maintain balance. End of session he reported feeling pretty  good.   EVALUATION: Pt is a pleasant 67 year old gentleman who arrives to PT evaluation on this date for LE weakness. Pt reports difficulty with static standing, stair navigation, balance, and higher level activities due to weakness. During today's session pt demonstrates generalized R LE weakness, altered functional mechanics, and gait impairments which are likely contributing to difficulty with aforementioned activities. Did discuss with pt that we will plan to monitor tolerance to activity given reported hx of polio affecting this limb, and aim to accommodate treatment accordingly although he notes in the past he has responded well to PT/exercise. No adverse events during today's session, pt verbalizes agreement with plan below. Recommend skilled PT to address aforementioned deficits to improve functional independence/tolerance. Pt departs today's session in no acute distress, all voiced questions/concerns addressed appropriately from PT perspective.    OBJECTIVE IMPAIRMENTS: Abnormal gait, decreased activity tolerance, decreased balance, decreased endurance, decreased mobility, difficulty walking, decreased ROM, decreased strength, improper body mechanics, postural dysfunction, and pain.   ACTIVITY LIMITATIONS: carrying, lifting, standing, stairs, transfers, and locomotion level  PARTICIPATION LIMITATIONS: community activity and yard work  PERSONAL FACTORS: Time since onset of injury/illness/exacerbation and 3+ comorbidities: L rTSA, HTN, hx polio, multiple back surgeries  are also affecting patient's functional outcome.   REHAB POTENTIAL: Good  CLINICAL DECISION MAKING: Stable/uncomplicated  EVALUATION COMPLEXITY: Low   GOALS: Goals reviewed with patient? No  SHORT TERM GOALS: Target date: 07/28/2022 Pt will demonstrate appropriate understanding and performance of initially prescribed HEP in order to facilitate improved independence with management of symptoms.  Baseline: HEP provided  on eval 08/06/22: good adherence reported  Goal status: MET   2. Pt will score greater than or equal to 51 on FOTO in order to demonstrate improved perception of function due to symptoms.  Baseline: 46  08/06/22: 56   Goal status: MET  LONG TERM GOALS: Target date: 08/18/2022  Pt will score 56 on FOTO in order to demonstrate improved perception of functional status due to symptoms.  Baseline: 46 Goal status: INITIAL  2.  Pt will report/demonstrate ability to stand for up to without increase in instability/discomfort in order to improve tolerance to ADLs. Baseline: fatigue/instability after 10-31min Goal status: INITIAL  3.  Pt will demonstrate at least 1pt increase on MMT scale globally throughout RLE in order to promote improved functional strength. Baseline: see MMT chart above Goal status: INITIAL  4. Pt will perform 5xSTS in =/<12 sec in order to demonstrate reduced fall risk and improved functional independence. (MCID of 2.3sec)  Baseline: 16sec no UE support, altered kinematics  Goal status: INITIAL   5. Pt will demonstrate ability to safely navigate a full flight of stairs using unilateral rail and reported RPE <5/10 in order to promote improved home navigation.  Baseline: states he avoids stairs in home due to difficulty  Goal status: INITIAL   PLAN:  PT FREQUENCY: 2x/week  PT DURATION: 6 weeks  PLANNED INTERVENTIONS: Therapeutic exercises, Therapeutic activity, Neuromuscular re-education, Balance training, Gait training, Patient/Family education, Self Care, Stair training, Vestibular training, Aquatic Therapy, Cryotherapy, Moist heat, Taping, Manual therapy, and Re-evaluation.  PLAN FOR NEXT SESSION: Reassess next session next session. RLE and core strengthening. Monitor response to activity and adjust accordingly.       Harkirat Orozco PT, DPT, LAT, ATC  08/12/22  10:16 AM

## 2022-08-17 ENCOUNTER — Ambulatory Visit: Payer: Medicare Other | Admitting: Physical Therapy

## 2022-08-17 ENCOUNTER — Encounter: Payer: Self-pay | Admitting: Physical Therapy

## 2022-08-17 DIAGNOSIS — G8929 Other chronic pain: Secondary | ICD-10-CM | POA: Diagnosis not present

## 2022-08-17 DIAGNOSIS — M6281 Muscle weakness (generalized): Secondary | ICD-10-CM | POA: Diagnosis not present

## 2022-08-17 DIAGNOSIS — R2689 Other abnormalities of gait and mobility: Secondary | ICD-10-CM

## 2022-08-17 DIAGNOSIS — M25512 Pain in left shoulder: Secondary | ICD-10-CM | POA: Diagnosis not present

## 2022-08-17 NOTE — Therapy (Signed)
OUTPATIENT PHYSICAL THERAPY PROGRESS NOTE + RECERTIFICATION   Patient Name: Victor Ramirez MRN: [id number] DOB:1955/04/17, 67 y.o., male Today's Date: 08/17/2022   Progress Note Reporting Period 07/07/22 to 08/17/22  See note below for Objective Data and Assessment of Progress/Goals.      END OF SESSION:  PT End of Session - 08/17/22 1016     Visit Number 9    Number of Visits 17    Date for PT Re-Evaluation 09/14/22    Authorization Type [employer] M[employer]    Progress Note Due on Visit 19    PT Start Time 1017    PT Stop Time 1058    PT Time Calculation (min) 41 min    Activity Tolerance Patient tolerated treatment well;No increased pain    Behavior During Therapy WFL for tasks assessed/performed              Past Medical History:  Diagnosis Date   Arthritis    Hemochromatosis    Hypertension    Past Surgical History:  Procedure Laterality Date   ANTERIOR LATERAL LUMBAR FUSION WITH PERCUTANEOUS SCREW 1 LEVEL Right 11/27/2016   Procedure: Lumbar Three-Four Transpsoas Lumbar InterbodyFfusion;  Surgeon: Ditty, Loura Halt, MD;  Location: [facility] OR;  Service: Neurosurgery;  Laterality: Right;  L3-4 Transpsoas lumbar interbody fusion/L3-4 Pedicle screw fixation with posterolateral arthrodesis/minimally invasive decompression/Mazor   APPLICATION OF ROBOTIC ASSISTANCE FOR SPINAL PROCEDURE N/A 11/27/2016   Procedure: Lumbar Three-Four Pedicle Screw Fixation with Posterolateral Arthrodesis with Minimally Invasive Decompression with Application of Robotic Assistance;  Surgeon: Ditty, Loura Halt, MD;  Location: [facility] OR;  Service: Neurosurgery;  Laterality: N/A;   BACK SURGERY     x3    BACK SURGERY     battery surgery    REVERSE SHOULDER ARTHROPLASTY Left 09/16/2021   Procedure: LEFT REVERSE SHOULDER ARTHROPLASTY;  Surgeon: Cammy Copa, MD;  Location: [facility] OR;  Service: Orthopedics;  Laterality: Left;   spinal manipulation under anesthesia     TONSILLECTOMY     Patient  Active Problem List   Diagnosis Date Noted   Vitamin D deficiency 10/15/2021   Osteoporosis 10/15/2021   OA (osteoarthritis) of shoulder 09/16/2021   S/P reverse total shoulder arthroplasty, left 09/16/2021   Pain in right wrist 05/12/2021   RUQ abdominal pain    Acute pancreatitis without infection or necrosis 12/02/2020   Pain in left shoulder 11/28/2020   Rheumatoid factor positive 08/29/2020   Polymyalgia rheumatica (HCC) 08/08/2020   Polyarthralgia 08/08/2020   High risk medication use 08/08/2020   Lumbosacral spondylosis with radiculopathy 11/27/2016   Atrophy of muscle of right lower leg 02/04/2014   History of post-polio syndrome 02/02/2014   Pain of left lower extremity 02/02/2014   Chronic back pain 05/14/2013   HTN (hypertension) 05/14/2013   Headache 12/22/2011   Sleep apnea 11/17/2011   Elevation of level of transaminase or lactic acid dehydrogenase (LDH) 09/18/2010   Anxiety 09/08/2010   Tachycardia 09/08/2010   Hyperlipemia 03/01/2006   TIA (transient ischemic attack) 03/01/2006   Tobacco use disorder 03/01/2006   Depressive disorder 10/16/2005    PCP: Wilfrid Lund, PA  REFERRING PROVIDER: Wilfrid Lund, PA  REFERRING DIAG: R53.1 (ICD-10-CM) - Weakness  Rationale for Evaluation and Treatment: Rehabilitation  THERAPY DIAG:  Muscle weakness (generalized)  Other abnormalities of gait and mobility  Chronic left shoulder pain  ONSET DATE: since childhood  SUBJECTIVE:  SUBJECTIVE STATEMENT: Continues to endorse steady improvement, states he feels his balance is "much better", still getting some sway when he's very fatigued. Would like to continue with PT    PERTINENT HISTORY:  HTN, TIA,  hx polio, L reverse TSA, multiple lumbar surgeries (most recent in 2018, fracture  in 2023 see MRI below)   PAIN:  Pt w/ hx of MSK issues but states pain is well controlled with current regimen, denies any pain related to this episode  PRECAUTIONS: None  WEIGHT BEARING RESTRICTIONS: No  FALLS:  Has patient fallen in last 6 months? No  LIVING ENVIRONMENT: 2 story house, avoids stairs due to RLE weakness, uses rails Lives w/ adult son, pt states they help take care of each other Also has a friend who helps with housework/yardwork   OCCUPATION: on disability   PLOF: Independent - active, enjoys [place] hunting   PATIENT GOALS: get stronger, core strengthh  NEXT MD VISIT: August  OBJECTIVE: (objective measures completed at initial evaluation unless otherwise dated)   DIAGNOSTIC FINDINGS:  Sept 2023 lumbar MRI: "IMPRESSION: 1. L1 and L5 superior endplate fractures with mild height loss, new since 2019. Limited associated marrow edema, likely recent but nearly healed. 2. L2-3 progressive adjacent segment degeneration with compressive spinal stenosis and moderate left foraminal narrowing. 3. L3-S1 solid fusion."  PATIENT SURVEYS:  FOTO 46 current, 56 predicted FOTO 08/06/22: 56 MET  SCREENING FOR RED FLAGS: Red flag questioning/screening reassuring    COGNITION: Overall cognitive status: Within functional limits for tasks assessed     SENSATION: Light touch intact B LE, does not endorse any neuro complaints  POSTURE: mild lateral shift to L, fwd head posture  PALPATION: Not indicated  LUMBAR ROM:   AROM eval  Flexion   Extension   Right lateral flexion   Left lateral flexion   Right rotation   Left rotation    (Blank rows = not tested) (Key: WFL = within functional limits not formally assessed, * = concordant pain, s = stiffness/stretching sensation, NT = not tested)   LOWER EXTREMITY ROM:     Active  Right eval Left eval  Hip flexion    Hip extension    Hip internal rotation    Hip external rotation    Knee extension    Knee  flexion    (Blank rows = not tested) (Key: WFL = within functional limits not formally assessed, * = concordant pain, s = stiffness/stretching sensation, NT = not tested)  Comments:    LOWER EXTREMITY MMT:    MMT Right eval Left eval R/L 08/17/22  Hip flexion 3+ 4 4-/4+  Hip abduction (modified sitting) 4 5 4+/5  Hip internal rotation     Hip external rotation     Knee flexion 3+ 5 4/5  Knee extension 3+ 5 4+/5  Ankle dorsiflexion 3- 5 4/5   (Blank rows = not tested) (Key: WFL = within functional limits not formally assessed, * = concordant pain, s = stiffness/stretching sensation, NT = not tested)  Comments: MMT painless   FUNCTIONAL TESTS:  5xSTS 16.40 sec no UE support, weight shift to R   BIL calf raise w/ UE balancing support x30 repetitions, increased ROM and weight shift for LLE consistent throughout repetitions, pt reports subjective improvement in ease of movement with repetition  08/17/22: 5xSTS 18.71sec no UE support symmetrical mechanics 12.47sec no UE support, mild weight shift to L   Stair assessment - able to perform reciprocal pattern without rail, mild instability  but no LOB, rates at 3-4/10 RPE   GAIT: Distance walked: within clinic Assistive device utilized: None Level of assistance: Complete Independence Comments: increased weight shift to L, reduced truncal ROM and arm swing, reduced stance time on R   FGA 08/17/22:  -Item 1 Gait Level Surface: mild impairment 2  -Item 2 Change in Gait Speed: mild impairment 2  -Item 3 Gait with Horizontal Head Turns: Normal 3  -Item 4 Gait with Vertical Head Turns: Normal 3  -Item 5 Gait with Pivot Turn: mild impairment 2  -Item 6 Step Over Obstacle:  mild impairment 2  -Item 7 Gait with Narrow Base of Support: moderate impairment 1  -Item 8 Gait with Eyes Closed: moderate impairment 1              -Item 9 Ambulating Backwards: mild impairment 2  -Item 10 Steps: mild impairment 2 Total: 20 /30  * Score of <=22/30  indicates that patient is at increased risk for falls.   TODAY'S TREATMENT:                                                                                                                              [facility] Adult PT Treatment:                                                DATE: 08/17/22 Therapeutic Exercise: Hamstring curl machine  single limb 25# 3x6 cues for pacing BIL knee extension machine 15# 2x12 cues for form  5# farmer carries change of pace (green cone, yellow, green, red) 4 laps cues for pacing  STS w 10# 2x5 cues for form and pacing   Therapeutic Activity: 5xSTS + education Stair assessment + education FGA + education Education/discussion re: progress with PT, symptom behavior as it affects activity tolerance, PT goals/POC    PATIENT EDUCATION:  Education details: rationale for interventions, HEP, progress note, pt goals and PT POC Person educated: Patient Education method: Explanation, Demonstration, Tactile cues, Verbal cues, and Handouts Education comprehension: verbalized understanding, returned demonstration, verbal cues required, tactile cues required, and needs further education    HOME EXERCISE PROGRAM: Access Code: [email or web address] URL: [email or web address] Date: 08/06/2022 Prepared by: Fransisco Hertz  Exercises - Heel Raises with Counter Support  - 1 x daily - 7 x weekly - 2 sets - 12 reps - Staggered Sit-to-Stand  - 1 x daily - 7 x weekly - 3 sets - 5 reps - Seated Long Arc Quad  - 1 x daily - 7 x weekly - 3 sets - 10 reps - Standing 3-Way Leg Reach with Resistance at Ankles and Counter Support  - 1 x daily - 7 x weekly - 3 sets - 8 reps  ASSESSMENT:  CLINICAL IMPRESSION: 08/17/2022 Pt arrives w/ report of continued improvement since starting therapy although he  still notes some difficulty w/ stairs, and some imbalance with fatigue. Looking at goals pt has progressed well with MMT, 5xSTS, and stair assessment. Given reports of continued imbalance with  fatigue, FGA is assessed today and pt is within fall risk category (see above) although no overt LOB observed. Given continued fall risk based on 5xSTS (at cutoff score in most populations, 12-14sec) and FGA, recommend extension of POC to continue working on strengthening and higher level balance. Pt verbalizes agreement and understanding with this plan, tolerates intervention well today. No adverse events, no pain. Pt departs today's session in no acute distress, all voiced questions/concerns addressed appropriately from PT perspective.    EVALUATION: Pt is a pleasant 67 year old gentleman who arrives to PT evaluation on this date for LE weakness. Pt reports difficulty with static standing, stair navigation, balance, and higher level activities due to weakness. During today's session pt demonstrates generalized R LE weakness, altered functional mechanics, and gait impairments which are likely contributing to difficulty with aforementioned activities. Did discuss with pt that we will plan to monitor tolerance to activity given reported hx of polio affecting this limb, and aim to accommodate treatment accordingly although he notes in the past he has responded well to PT/exercise. No adverse events during today's session, pt verbalizes agreement with plan below. Recommend skilled PT to address aforementioned deficits to improve functional independence/tolerance. Pt departs today's session in no acute distress, all voiced questions/concerns addressed appropriately from PT perspective.    OBJECTIVE IMPAIRMENTS: Abnormal gait, decreased activity tolerance, decreased balance, decreased endurance, decreased mobility, difficulty walking, decreased ROM, decreased strength, improper body mechanics, postural dysfunction, and pain.   ACTIVITY LIMITATIONS: carrying, lifting, standing, stairs, transfers, and locomotion level  PARTICIPATION LIMITATIONS: community activity and yard work  PERSONAL FACTORS: Time since  onset of injury/illness/exacerbation and 3+ comorbidities: L rTSA, HTN, hx polio, multiple back surgeries  are also affecting patient's functional outcome.   REHAB POTENTIAL: Good  CLINICAL DECISION MAKING: Stable/uncomplicated  EVALUATION COMPLEXITY: Low   GOALS: Goals reviewed with patient? No  SHORT TERM GOALS: Target date: 07/28/2022 Pt will demonstrate appropriate understanding and performance of initially prescribed HEP in order to facilitate improved independence with management of symptoms.  Baseline: HEP provided on eval 08/06/22: good adherence reported  Goal status: MET   2. Pt will score greater than or equal to 51 on FOTO in order to demonstrate improved perception of function due to symptoms.  Baseline: 46  08/06/22: 56   Goal status: MET  LONG TERM GOALS: Target date: 09/14/2022 (updated 08/17/22) Pt will score 56 on FOTO in order to demonstrate improved perception of functional status due to symptoms.  Baseline: 46 08/17/22: 56 Goal status: MET   2.  Pt will report/demonstrate ability to stand for up to without increase in instability/discomfort in order to improve tolerance to ADLs. Baseline: fatigue/instability after 10-75min 08/17/22: varies, >51min from back perspective  Goal status: ONGOING  3.  Pt will demonstrate at least 1pt increase on MMT scale globally throughout RLE in order to promote improved functional strength. Baseline: see MMT chart above 08/17/22: see MMT chart above  Goal status: MET  4. Pt will perform 5xSTS in =/<10 sec in order to demonstrate reduced fall risk and improved functional independence. (MCID of 2.3sec, cutoff score for fall risk <12-14sec in most populations)  Baseline: 16sec no UE support, altered kinematics  08/17/22: 12 sec (MET INITIAL goal =/<12sec)  Goal status: REVISED 08/17/22  5. Pt will demonstrate ability to  safely navigate a full flight of stairs using unilateral rail and reported RPE <5/10 in order to promote  improved home navigation.  Baseline: states he avoids stairs in home due to difficulty  08/17/22: able to perform without rails, mild instability no LOB  Goal status: PARTIALLY MET   6. Pt will score greater than or equal to 22/30 on Functional Gait assessment in order to indicate reduced fall risk (cutoff score </= 22/30 predictive of falls per Alvino Chapel et al 2010, MCID 4 pts Beninato et al 2014)  Baseline: 20/30  Goal status: NEW 08/17/22   PLAN: (updated 08/17/22)  PT FREQUENCY: 2x/week  PT DURATION: 4 weeks  PLANNED INTERVENTIONS: Therapeutic exercises, Therapeutic activity, Neuromuscular re-education, Balance training, Gait training, Patient/Family education, Self Care, Stair training, Vestibular training, Aquatic Therapy, Cryotherapy, Moist heat, Taping, Manual therapy, and Re-evaluation.  PLAN FOR NEXT SESSION: continue to work on LE strength, begin working more so on higher level balance, stair navigation, change of pace activities.    Ashley Murrain PT, DPT 08/17/2022 12:09 PM

## 2022-08-26 ENCOUNTER — Encounter: Payer: Medicare Other | Admitting: Physical Therapy

## 2022-09-01 NOTE — Therapy (Signed)
OUTPATIENT PHYSICAL THERAPY TREATMENT NOTE   Patient Name: Victor Ramirez MRN: [id number] DOB:08/13/1955, 67 y.o., male Today's Date: 09/02/2022  END OF SESSION:  PT End of Session - 09/02/22 0802     Visit Number 10    Number of Visits 17    Date for PT Re-Evaluation 09/14/22    Authorization Type [employer] MDC    Progress Note Due on Visit 19    PT Start Time 0802    PT Stop Time 0843    PT Time Calculation (min) 41 min    Activity Tolerance Patient tolerated treatment well;No increased pain    Behavior During Therapy WFL for tasks assessed/performed               Past Medical History:  Diagnosis Date   Arthritis    Hemochromatosis    Hypertension    Past Surgical History:  Procedure Laterality Date   ANTERIOR LATERAL LUMBAR FUSION WITH PERCUTANEOUS SCREW 1 LEVEL Right 11/27/2016   Procedure: Lumbar Three-Four Transpsoas Lumbar InterbodyFfusion;  Surgeon: Ditty, Loura Halt, MD;  Location: [facility] OR;  Service: Neurosurgery;  Laterality: Right;  L3-4 Transpsoas lumbar interbody fusion/L3-4 Pedicle screw fixation with posterolateral arthrodesis/minimally invasive decompression/Mazor   APPLICATION OF ROBOTIC ASSISTANCE FOR SPINAL PROCEDURE N/A 11/27/2016   Procedure: Lumbar Three-Four Pedicle Screw Fixation with Posterolateral Arthrodesis with Minimally Invasive Decompression with Application of Robotic Assistance;  Surgeon: Ditty, Loura Halt, MD;  Location: [facility] OR;  Service: Neurosurgery;  Laterality: N/A;   BACK SURGERY     x3    BACK SURGERY     battery surgery    REVERSE SHOULDER ARTHROPLASTY Left 09/16/2021   Procedure: LEFT REVERSE SHOULDER ARTHROPLASTY;  Surgeon: Cammy Copa, MD;  Location: [facility] OR;  Service: Orthopedics;  Laterality: Left;   spinal manipulation under anesthesia     TONSILLECTOMY     Patient Active Problem List   Diagnosis Date Noted   Vitamin D deficiency 10/15/2021   Osteoporosis 10/15/2021   OA (osteoarthritis) of shoulder 09/16/2021    S/P reverse total shoulder arthroplasty, left 09/16/2021   Pain in right wrist 05/12/2021   RUQ abdominal pain    Acute pancreatitis without infection or necrosis 12/02/2020   Pain in left shoulder 11/28/2020   Rheumatoid factor positive 08/29/2020   Polymyalgia rheumatica (HCC) 08/08/2020   Polyarthralgia 08/08/2020   High risk medication use 08/08/2020   Lumbosacral spondylosis with radiculopathy 11/27/2016   Atrophy of muscle of right lower leg 02/04/2014   History of post-polio syndrome 02/02/2014   Pain of left lower extremity 02/02/2014   Chronic back pain 05/14/2013   HTN (hypertension) 05/14/2013   Headache 12/22/2011   Sleep apnea 11/17/2011   Elevation of level of transaminase or lactic acid dehydrogenase (LDH) 09/18/2010   Anxiety 09/08/2010   Tachycardia 09/08/2010   Hyperlipemia 03/01/2006   TIA (transient ischemic attack) 03/01/2006   Tobacco use disorder 03/01/2006   Depressive disorder 10/16/2005    PCP: Wilfrid Lund, PA  REFERRING PROVIDER: Wilfrid Lund, PA  REFERRING DIAG: R53.1 (ICD-10-CM) - Weakness  Rationale for Evaluation and Treatment: Rehabilitation  THERAPY DIAG:  Muscle weakness (generalized)  Other abnormalities of gait and mobility  ONSET DATE: since childhood  SUBJECTIVE:  SUBJECTIVE STATEMENT: No issues since last session. Rescheduled his vascular appt to next week. Does mention some weight loss over past few weeks, encouraged to discuss with his PCP. Balance continues to improve.    PERTINENT HISTORY:  HTN, TIA,  hx polio, L reverse TSA, multiple lumbar surgeries (most recent in 2018, fracture in 2023 see MRI below)   PAIN:  Pt w/ hx of MSK issues but states pain is well controlled with current regimen, denies any pain related to this  episode  PRECAUTIONS: None  WEIGHT BEARING RESTRICTIONS: No  FALLS:  Has patient fallen in last 6 months? No  LIVING ENVIRONMENT: 2 story house, avoids stairs due to RLE weakness, uses rails Lives w/ adult son, pt states they help take care of each other Also has a friend who helps with housework/yardwork   OCCUPATION: on disability   PLOF: Independent - active, enjoys [place] hunting   PATIENT GOALS: get stronger, core strengthh  NEXT MD VISIT: August  OBJECTIVE: (objective measures completed at initial evaluation unless otherwise dated)   DIAGNOSTIC FINDINGS:  Sept 2023 lumbar MRI: "IMPRESSION: 1. L1 and L5 superior endplate fractures with mild height loss, new since 2019. Limited associated marrow edema, likely recent but nearly healed. 2. L2-3 progressive adjacent segment degeneration with compressive spinal stenosis and moderate left foraminal narrowing. 3. L3-S1 solid fusion."  PATIENT SURVEYS:  FOTO 46 current, 56 predicted FOTO 08/06/22: 56 MET  SCREENING FOR RED FLAGS: Red flag questioning/screening reassuring    COGNITION: Overall cognitive status: Within functional limits for tasks assessed     SENSATION: Light touch intact B LE, does not endorse any neuro complaints  POSTURE: mild lateral shift to L, fwd head posture  PALPATION: Not indicated  LUMBAR ROM:   AROM eval  Flexion   Extension   Right lateral flexion   Left lateral flexion   Right rotation   Left rotation    (Blank rows = not tested) (Key: WFL = within functional limits not formally assessed, * = concordant pain, s = stiffness/stretching sensation, NT = not tested)   LOWER EXTREMITY ROM:     Active  Right eval Left eval  Hip flexion    Hip extension    Hip internal rotation    Hip external rotation    Knee extension    Knee flexion    (Blank rows = not tested) (Key: WFL = within functional limits not formally assessed, * = concordant pain, s = stiffness/stretching  sensation, NT = not tested)  Comments:    LOWER EXTREMITY MMT:    MMT Right eval Left eval R/L 08/17/22  Hip flexion 3+ 4 4-/4+  Hip abduction (modified sitting) 4 5 4+/5  Hip internal rotation     Hip external rotation     Knee flexion 3+ 5 4/5  Knee extension 3+ 5 4+/5  Ankle dorsiflexion 3- 5 4/5   (Blank rows = not tested) (Key: WFL = within functional limits not formally assessed, * = concordant pain, s = stiffness/stretching sensation, NT = not tested)  Comments: MMT painless   FUNCTIONAL TESTS:  5xSTS 16.40 sec no UE support, weight shift to R   BIL calf raise w/ UE balancing support x30 repetitions, increased ROM and weight shift for LLE consistent throughout repetitions, pt reports subjective improvement in ease of movement with repetition  08/17/22: 5xSTS 18.71sec no UE support symmetrical mechanics 12.47sec no UE support, mild weight shift to L   Stair assessment - able to perform reciprocal pattern  without rail, mild instability but no LOB, rates at 3-4/10 RPE   GAIT: Distance walked: within clinic Assistive device utilized: None Level of assistance: Complete Independence Comments: increased weight shift to L, reduced truncal ROM and arm swing, reduced stance time on R   FGA 08/17/22:  -Item 1 Gait Level Surface: mild impairment 2  -Item 2 Change in Gait Speed: mild impairment 2  -Item 3 Gait with Horizontal Head Turns: Normal 3  -Item 4 Gait with Vertical Head Turns: Normal 3  -Item 5 Gait with Pivot Turn: mild impairment 2  -Item 6 Step Over Obstacle:  mild impairment 2  -Item 7 Gait with Narrow Base of Support: moderate impairment 1  -Item 8 Gait with Eyes Closed: moderate impairment 1              -Item 9 Ambulating Backwards: mild impairment 2  -Item 10 Steps: mild impairment 2 Total: 20 /30  * Score of <=22/30 indicates that patient is at increased risk for falls.   TODAY'S TREATMENT:                                                                                                                               [facility] Adult PT Treatment:                                                DATE: 09/02/22 Therapeutic Exercise: Nu step LE only 4 min (1:30 slow, :30 sec RPE 6/10, 1:30 slow, :30 RPE 6) STS x6 w 10#, 15# 2x6 from mat cues  Change of pace farmer carries 5# BIL, (3 green 2 yellow cones) cues for pacing and velocity; 3 laps with weight, 2 laps without HEP update + review/education  Neuromuscular re-ed: Runner step up onto airex 2x13 RLE stance CGA cues for posture and stability for stance limb Tandem walk 3 laps along counter weaning UE support, CGA, cues for sequencing  Cone taps at counter x10 BIL LE cues for stability    [facility] Adult PT Treatment:                                                DATE: 08/17/22 Therapeutic Exercise: Hamstring curl machine  single limb 25# 3x6 cues for pacing BIL knee extension machine 15# 2x12 cues for form  5# farmer carries change of pace (green cone, yellow, green, red) 4 laps cues for pacing  STS w 10# 2x5 cues for form and pacing   Therapeutic Activity: 5xSTS + education Stair assessment + education FGA + education Education/discussion re: progress with PT, symptom behavior as it affects activity tolerance, PT goals/POC    PATIENT EDUCATION:  Education details: rationale for  interventions, HEP, progress note, pt goals and PT POC Person educated: Patient Education method: Explanation, Demonstration, Tactile cues, Verbal cues, and Handouts Education comprehension: verbalized understanding, returned demonstration, verbal cues required, tactile cues required, and needs further education    HOME EXERCISE PROGRAM: Access Code: [email or web address] URL: [email or web address] Date: 09/02/2022 Prepared by: Fransisco Hertz  Program Notes with marches - do at counter tapping to empty water bottle as done in clinic  Exercises - Heel Raises with Counter Support  - 1 x daily - 7 x weekly - 2 sets - 12  reps - Staggered Sit-to-Stand  - 1 x daily - 7 x weekly - 3 sets - 5 reps - Seated Long Arc Quad  - 1 x daily - 7 x weekly - 3 sets - 10 reps - Standing 3-Way Leg Reach with Resistance at Ankles and Counter Support  - 1 x daily - 7 x weekly - 3 sets - 8 reps - Standing Marching  - 1 x daily - 7 x weekly - 3 sets - 10 reps  ASSESSMENT:  CLINICAL IMPRESSION: 09/02/2022 Pt arrives w/o pain, continues to endorse steady progress. Today continuing to progress strengthening program, also incorporating more emphasis on balance. Tolerates well with fatigue, no adverse events or pain. Continues to demo challenges with altered BOS, single limb stability, and change of pace. Pt departs today's session in no acute distress, all voiced questions/concerns addressed appropriately from PT perspective.      EVALUATION: Pt is a pleasant 67 year old gentleman who arrives to PT evaluation on this date for LE weakness. Pt reports difficulty with static standing, stair navigation, balance, and higher level activities due to weakness. During today's session pt demonstrates generalized R LE weakness, altered functional mechanics, and gait impairments which are likely contributing to difficulty with aforementioned activities. Did discuss with pt that we will plan to monitor tolerance to activity given reported hx of polio affecting this limb, and aim to accommodate treatment accordingly although he notes in the past he has responded well to PT/exercise. No adverse events during today's session, pt verbalizes agreement with plan below. Recommend skilled PT to address aforementioned deficits to improve functional independence/tolerance. Pt departs today's session in no acute distress, all voiced questions/concerns addressed appropriately from PT perspective.    OBJECTIVE IMPAIRMENTS: Abnormal gait, decreased activity tolerance, decreased balance, decreased endurance, decreased mobility, difficulty walking, decreased ROM, decreased  strength, improper body mechanics, postural dysfunction, and pain.   ACTIVITY LIMITATIONS: carrying, lifting, standing, stairs, transfers, and locomotion level  PARTICIPATION LIMITATIONS: community activity and yard work  PERSONAL FACTORS: Time since onset of injury/illness/exacerbation and 3+ comorbidities: L rTSA, HTN, hx polio, multiple back surgeries  are also affecting patient's functional outcome.   REHAB POTENTIAL: Good  CLINICAL DECISION MAKING: Stable/uncomplicated  EVALUATION COMPLEXITY: Low   GOALS: Goals reviewed with patient? No  SHORT TERM GOALS: Target date: 07/28/2022 Pt will demonstrate appropriate understanding and performance of initially prescribed HEP in order to facilitate improved independence with management of symptoms.  Baseline: HEP provided on eval 08/06/22: good adherence reported  Goal status: MET   2. Pt will score greater than or equal to 51 on FOTO in order to demonstrate improved perception of function due to symptoms.  Baseline: 46  08/06/22: 56   Goal status: MET  LONG TERM GOALS: Target date: 09/14/2022 (updated 08/17/22) Pt will score 56 on FOTO in order to demonstrate improved perception of functional status due to symptoms.  Baseline: 46 08/17/22: 56 Goal status: MET  2.  Pt will report/demonstrate ability to stand for up to without increase in instability/discomfort in order to improve tolerance to ADLs. Baseline: fatigue/instability after 10-15min 08/17/22: varies, >38min from back perspective  Goal status: ONGOING  3.  Pt will demonstrate at least 1pt increase on MMT scale globally throughout RLE in order to promote improved functional strength. Baseline: see MMT chart above 08/17/22: see MMT chart above  Goal status: MET  4. Pt will perform 5xSTS in =/<10 sec in order to demonstrate reduced fall risk and improved functional independence. (MCID of 2.3sec, cutoff score for fall risk <12-14sec in most populations)  Baseline: 16sec no  UE support, altered kinematics  08/17/22: 12 sec (MET INITIAL goal =/<12sec)  Goal status: REVISED 08/17/22  5. Pt will demonstrate ability to safely navigate a full flight of stairs using unilateral rail and reported RPE <5/10 in order to promote improved home navigation.  Baseline: states he avoids stairs in home due to difficulty  08/17/22: able to perform without rails, mild instability no LOB  Goal status: PARTIALLY MET   6. Pt will score greater than or equal to 22/30 on Functional Gait assessment in order to indicate reduced fall risk (cutoff score </= 22/30 predictive of falls per Alvino Chapel et al 2010, MCID 4 pts Beninato et al 2014)  Baseline: 20/30  Goal status: NEW 08/17/22   PLAN: (updated 08/17/22)  PT FREQUENCY: 2x/week  PT DURATION: 4 weeks  PLANNED INTERVENTIONS: Therapeutic exercises, Therapeutic activity, Neuromuscular re-education, Balance training, Gait training, Patient/Family education, Self Care, Stair training, Vestibular training, Aquatic Therapy, Cryotherapy, Moist heat, Taping, Manual therapy, and Re-evaluation.  PLAN FOR NEXT SESSION: continue to work on LE strength, begin working more so on higher level balance, stair navigation, change of pace activities.     Ashley Murrain PT, DPT 09/02/2022 10:35 AM

## 2022-09-02 ENCOUNTER — Telehealth: Payer: Self-pay | Admitting: *Deleted

## 2022-09-02 ENCOUNTER — Ambulatory Visit: Payer: Medicare Other | Attending: Family Medicine | Admitting: Physical Therapy

## 2022-09-02 ENCOUNTER — Encounter: Payer: Self-pay | Admitting: Physical Therapy

## 2022-09-02 DIAGNOSIS — M6281 Muscle weakness (generalized): Secondary | ICD-10-CM | POA: Diagnosis not present

## 2022-09-02 DIAGNOSIS — M25512 Pain in left shoulder: Secondary | ICD-10-CM | POA: Insufficient documentation

## 2022-09-02 DIAGNOSIS — R2689 Other abnormalities of gait and mobility: Secondary | ICD-10-CM | POA: Diagnosis not present

## 2022-09-02 DIAGNOSIS — G8929 Other chronic pain: Secondary | ICD-10-CM | POA: Diagnosis not present

## 2022-09-02 NOTE — Telephone Encounter (Signed)
Patient called concerned about bruising and long term prednisone use. Patient wants to know what to do next? Please advise.

## 2022-09-07 NOTE — Telephone Encounter (Signed)
Bruising is a common side effect with long term prednisone due to thinning the skin and weakening the lining of small blood vessels. There is no real fix for this besides getting off the prednisone which would be a gradual taper like we discussed before.

## 2022-09-07 NOTE — Telephone Encounter (Signed)
Attempted to contact the patient and left a message to call the office back. 

## 2022-09-07 NOTE — Telephone Encounter (Signed)
Patient advised per Dr. Dimple Casey, That tapering plan sounds okay. If he tolerates going down to every other day he can try splitting the tablets and just taking 2.5 mg once every day. Or we can prescribe the 2.5 mg tablets if that is easier. If still doing well on the lower dose for at least 2 weeks we can either switch to 1 mg tablets or stay at 2.5 mg and check again at follow up next month. Patient verbalized understanding.   Patient states he is okay with just splitting the 5 MG tablets in half.

## 2022-09-07 NOTE — Telephone Encounter (Signed)
That tapering plan sounds okay. If he tolerates going down to every other day he can try splitting the tablets and just taking 2.5 mg once every day. Or we can prescribe the 2.5 mg tablets if that is easier. If still doing well on the lower dose for at least 2 weeks we can either switch to 1 mg tablets or stay at 2.5 mg and check again at follow up next month.

## 2022-09-07 NOTE — Telephone Encounter (Signed)
Advised patient that per Dr. Dimple Casey:  Bruising is a common side effect with long term prednisone due to thinning the skin and weakening the lining of small blood vessels. There is no real fix for this besides getting off the prednisone which would be a gradual taper like we discussed before.    Patient states he has already started to taper on his own due to the bruising. Patient reports that just putting his hands in his pockets, creates bruises.   Patient is currently taking prednisone for 2 days, then skipping a day. Patient states he is about to start taking it every other day. He reports he is symptom free for the PMR. Patient would like a return to call to confirm this tapering of prednisone or suggest a different way to taper. Please advise.

## 2022-09-07 NOTE — Progress Notes (Unsigned)
[facility] [place]  ASSESSMENT / PLAN: Victor Ramirez is a 67 y.o. male with a infrarenal abdominal aortic aneurysm measuring 40mm and right common iliac artery aneurysm measuring 22mm.  Recommend:  Abstinence from all tobacco products. Blood glucose control with goal A1c < 7%. Blood pressure control with goal blood pressure < 140/90 mmHg. Lipid reduction therapy with goal LDL-C <100 mg/dL.  Aspirin 81mg  PO QD.  Atorvastatin 40-80mg  PO QD (or other "high intensity" statin therapy).  Follow up with me in 1 year with repeat AAA duplex.   CHIEF COMPLAINT: Incidental abdominal aortic aneurysm  HISTORY OF PRESENT ILLNESS: Victor Ramirez is a 67 y.o. male who presents to clinic for evaluation of incidental discovery of abdominal aortic aneurysm.  The patient is asymptomatic from an aneurysm standpoint.  He had a duplex several years ago which showed a small aneurysm.  His repeat duplex performed by his primary care physician which showed interval growth.  He is referred for further evaluation.  We reviewed the natural history of aneurysm disease, the rationale for surveillance, and his low rupture risk.   Past Medical History:  Diagnosis Date   Arthritis    Hemochromatosis    Hypertension     Past Surgical History:  Procedure Laterality Date   ANTERIOR LATERAL LUMBAR FUSION WITH PERCUTANEOUS SCREW 1 LEVEL Right 11/27/2016   Procedure: Lumbar Three-Four Transpsoas Lumbar InterbodyFfusion;  Surgeon: Ditty, Loura Halt, MD;  Location: [facility] OR;  Service: Neurosurgery;  Laterality: Right;  L3-4 Transpsoas lumbar interbody fusion/L3-4 Pedicle screw fixation with posterolateral arthrodesis/minimally invasive decompression/Mazor   APPLICATION OF ROBOTIC ASSISTANCE FOR SPINAL PROCEDURE N/A 11/27/2016   Procedure: Lumbar Three-Four Pedicle Screw Fixation with Posterolateral Arthrodesis with Minimally Invasive Decompression with Application of Robotic Assistance;  Surgeon:  Ditty, Loura Halt, MD;  Location: [facility] OR;  Service: Neurosurgery;  Laterality: N/A;   BACK SURGERY     x3    BACK SURGERY     battery surgery    REVERSE SHOULDER ARTHROPLASTY Left 09/16/2021   Procedure: LEFT REVERSE SHOULDER ARTHROPLASTY;  Surgeon: Cammy Copa, MD;  Location: [facility] OR;  Service: Orthopedics;  Laterality: Left;   spinal manipulation under anesthesia     TONSILLECTOMY      Family History  Problem Relation Age of Onset   Arthritis Mother    Thyroid cancer Mother    Melanoma Father    Fibromyalgia Sister    Cancer Brother    Prostate cancer Brother    Williams syndrome Son     Social History   Socioeconomic History   Marital status: Widowed    Spouse name: Not on file   Number of children: Not on file   Years of education: Not on file   Highest education level: Not on file  Occupational History   Not on file  Tobacco Use   Smoking status: Former    Packs/day: 2.00    Years: 35.00    Additional pack years: 0.00    Total pack years: 70.00    Types: Cigarettes    Quit date: 2007    Years since quitting: 17.5    Passive exposure: Past   Smokeless tobacco: Never  Vaping Use   Vaping Use: Never used  Substance and Sexual Activity   Alcohol use: Yes    Alcohol/week: 7.0 - 10.0 standard drinks of alcohol    Types: 7 - 10 Cans of beer per week   Drug use: No   Sexual activity: Not  on file  Other Topics Concern   Not on file  Social History Narrative   Not on file   Social Determinants of Health   Financial Resource Strain: Not on file  Food Insecurity: Not on file  Transportation Needs: Not on file  Physical Activity: Not on file  Stress: Not on file  Social Connections: Not on file  Intimate Partner Violence: Not on file    Allergies  Allergen Reactions   Bee Venom Anaphylaxis    Current Outpatient Medications  Medication Sig Dispense Refill   acetaminophen (TYLENOL) 500 MG tablet Take 2 tablets (1,000 mg total) by mouth every 6  (six) hours. (Patient not taking: Reported on 01/27/2022) 30 tablet 0   alendronate (FOSAMAX) 70 MG tablet TAKE 1 TABLET BY MOUTH ONCE A WEEK WITH A FULL GLASS OF WATER ON AN EMPTY STOMACH 12 tablet 0   aspirin EC 81 MG tablet Take 1 tablet (81 mg total) by mouth daily. Swallow whole. (Patient not taking: Reported on 01/27/2022) 30 tablet 0   buPROPion (WELLBUTRIN XL) 150 MG 24 hr tablet Take 150 mg by mouth every morning.     cetirizine (ZYRTEC) 10 MG tablet Take 10 mg by mouth daily.     diclofenac Sodium (VOLTAREN) 1 % GEL Apply 2 g topically daily as needed (arthritis). (Patient not taking: Reported on 10/15/2021)     EPINEPHrine (EPIPEN JR) 0.15 MG/0.3ML injection Inject 0.15 mg into the muscle as needed for anaphylaxis.     fluticasone (FLONASE) 50 MCG/ACT nasal spray Place 2 sprays into both nostrils daily.     gabapentin (NEURONTIN) 300 MG capsule Take 1 capsule (300 mg total) by mouth 3 (three) times daily. (Patient taking differently: Take 600 mg by mouth 2 (two) times daily.) 90 capsule 2   HYDROcodone-acetaminophen (NORCO) 7.5-325 MG tablet Take 1 tablet by mouth in the morning, at noon, and at bedtime.     lisinopril (PRINIVIL,ZESTRIL) 40 MG tablet Take 40 mg by mouth daily.     methocarbamol (ROBAXIN) 750 MG tablet Take 750 mg by mouth 2 (two) times daily.     oxyCODONE (OXY IR/ROXICODONE) 5 MG immediate release tablet Take 1 tablet (5 mg total) by mouth every 8 (eight) hours as needed for moderate pain (pain score 4-6). (Patient not taking: Reported on 01/27/2022) 30 tablet 0   predniSONE (DELTASONE) 5 MG tablet Take 1 tablet by mouth daily. 90 tablet 1   rosuvastatin (CRESTOR) 5 MG tablet Take 5 mg by mouth daily.     No current facility-administered medications for this visit.    PHYSICAL EXAM Vitals:   09/08/22 1410  BP: 116/74  Pulse: (!) 57  Resp: 20  Temp: 98.4 F (36.9 C)  SpO2: 95%  Weight: 166 lb (75.3 kg)  Height: 5\' 8"  (1.727 m)    Middle-age man in no  distress Regular rate and rhythm Unlabored breathing Palpable dorsalis pedis pulses bilaterally  PERTINENT LABORATORY AND RADIOLOGIC DATA  Most recent CBC    Latest Ref Rng & Units 04/30/2022   10:55 AM 09/08/2021   11:07 AM 12/03/2020    4:47 AM  CBC  WBC 3.8 - 10.8 Thousand/uL 9.2  11.4  14.9   Hemoglobin 13.2 - 17.1 g/dL 16.1  09.6  04.5   Hematocrit 38.5 - 50.0 % 43.2  43.9  41.3   Platelets 140 - 400 Thousand/uL 344  284  256      Most recent CMP    Latest Ref Rng & Units  04/30/2022   10:55 AM 01/27/2022   11:41 AM 10/15/2021    3:01 PM  CMP  Glucose 65 - 99 mg/dL 87  99  99   BUN 7 - 25 mg/dL 11  24  11    Creatinine 0.70 - 1.35 mg/dL 4.13  2.44  0.10   Sodium 135 - 146 mmol/L 137  137  138   Potassium 3.5 - 5.3 mmol/L 5.6  5.2  5.4   Chloride 98 - 110 mmol/L 101  100  105   CO2 20 - 32 mmol/L 29  27  26    Calcium 8.6 - 10.3 mg/dL 9.8  27.2  9.5   Total Protein 6.1 - 8.1 g/dL 6.9     Total Bilirubin 0.2 - 1.2 mg/dL 0.3     AST 10 - 35 U/L 25     ALT 9 - 46 U/L 42      Outside duplex shows 40 mm infrarenal abdominal aortic aneurysm Duplex shows bilateral less than 50% carotid artery stenosis  Rande Brunt. Lenell Antu, MD FACS [facility] Phone Number: [phone] 09/07/2022 9:03 AM   Total time spent on preparing this encounter including chart review, data review, collecting history, examining the patient, coordinating care for this new patient, 60 minutes.  Portions of this report may have been transcribed using voice recognition software.  Every effort has been made to ensure accuracy; however, inadvertent computerized transcription errors may still be present.

## 2022-09-08 ENCOUNTER — Ambulatory Visit: Payer: Medicare Other | Admitting: Physical Therapy

## 2022-09-08 ENCOUNTER — Encounter: Payer: Self-pay | Admitting: Vascular Surgery

## 2022-09-08 ENCOUNTER — Telehealth: Payer: Self-pay | Admitting: Internal Medicine

## 2022-09-08 ENCOUNTER — Ambulatory Visit: Payer: Medicare Other | Admitting: Vascular Surgery

## 2022-09-08 VITALS — BP 116/74 | HR 57 | Temp 98.4°F | Resp 20 | Ht 68.0 in | Wt 166.0 lb

## 2022-09-08 DIAGNOSIS — I7143 Infrarenal abdominal aortic aneurysm, without rupture: Secondary | ICD-10-CM | POA: Diagnosis not present

## 2022-09-08 NOTE — Telephone Encounter (Signed)
Patient called stating he spoke with someone at our office yesterday, 09/07/22 regarding a dermatology referral. Patient states he has additional questions and requested a return call.

## 2022-09-08 NOTE — Telephone Encounter (Signed)
Contacted the patient to inquire who he spoke with about a referral for dermatology and to try to find out what other questions he had. Patient states he is not able to talk on the phone right now because he is on his was to the dermatologist and if he keeps talking on the phone he will be late. Patient hung up the phone.

## 2022-09-10 ENCOUNTER — Ambulatory Visit: Payer: Medicare Other | Admitting: Physical Therapy

## 2022-09-10 ENCOUNTER — Encounter: Payer: Self-pay | Admitting: Physical Therapy

## 2022-09-10 DIAGNOSIS — G8929 Other chronic pain: Secondary | ICD-10-CM

## 2022-09-10 DIAGNOSIS — R2689 Other abnormalities of gait and mobility: Secondary | ICD-10-CM

## 2022-09-10 DIAGNOSIS — M6281 Muscle weakness (generalized): Secondary | ICD-10-CM

## 2022-09-10 DIAGNOSIS — M25512 Pain in left shoulder: Secondary | ICD-10-CM | POA: Diagnosis not present

## 2022-09-10 NOTE — Therapy (Signed)
OUTPATIENT PHYSICAL THERAPY TREATMENT NOTE   Patient Name: TANIA PERROTT MRN: [id number] DOB:22-Jul-1955, 67 y.o., male Today's Date: 09/10/2022  END OF SESSION:  PT End of Session - 09/10/22 0941     Visit Number 11    Number of Visits 17    Date for PT Re-Evaluation 09/14/22    Authorization Type [employer] MDC    Progress Note Due on Visit 19    PT Start Time [id number]   pt arrived late   PT Stop Time 1014    PT Time Calculation (min) 35 min    Activity Tolerance Patient tolerated treatment well;No increased pain    Behavior During Therapy WFL for tasks assessed/performed                Past Medical History:  Diagnosis Date   Arthritis    Hemochromatosis    Hypertension    Past Surgical History:  Procedure Laterality Date   ANTERIOR LATERAL LUMBAR FUSION WITH PERCUTANEOUS SCREW 1 LEVEL Right 11/27/2016   Procedure: Lumbar Three-Four Transpsoas Lumbar InterbodyFfusion;  Surgeon: Ditty, Loura Halt, MD;  Location: [facility] OR;  Service: Neurosurgery;  Laterality: Right;  L3-4 Transpsoas lumbar interbody fusion/L3-4 Pedicle screw fixation with posterolateral arthrodesis/minimally invasive decompression/Mazor   APPLICATION OF ROBOTIC ASSISTANCE FOR SPINAL PROCEDURE N/A 11/27/2016   Procedure: Lumbar Three-Four Pedicle Screw Fixation with Posterolateral Arthrodesis with Minimally Invasive Decompression with Application of Robotic Assistance;  Surgeon: Ditty, Loura Halt, MD;  Location: [facility] OR;  Service: Neurosurgery;  Laterality: N/A;   BACK SURGERY     x3    BACK SURGERY     battery surgery    REVERSE SHOULDER ARTHROPLASTY Left 09/16/2021   Procedure: LEFT REVERSE SHOULDER ARTHROPLASTY;  Surgeon: Cammy Copa, MD;  Location: [facility] OR;  Service: Orthopedics;  Laterality: Left;   spinal manipulation under anesthesia     TONSILLECTOMY     Patient Active Problem List   Diagnosis Date Noted   Vitamin D deficiency 10/15/2021   Osteoporosis 10/15/2021   OA (osteoarthritis) of  shoulder 09/16/2021   S/P reverse total shoulder arthroplasty, left 09/16/2021   Pain in right wrist 05/12/2021   RUQ abdominal pain    Acute pancreatitis without infection or necrosis 12/02/2020   Pain in left shoulder 11/28/2020   Rheumatoid factor positive 08/29/2020   Polymyalgia rheumatica (HCC) 08/08/2020   Polyarthralgia 08/08/2020   High risk medication use 08/08/2020   Lumbosacral spondylosis with radiculopathy 11/27/2016   Atrophy of muscle of right lower leg 02/04/2014   History of post-polio syndrome 02/02/2014   Pain of left lower extremity 02/02/2014   Chronic back pain 05/14/2013   HTN (hypertension) 05/14/2013   Headache 12/22/2011   Sleep apnea 11/17/2011   Elevation of level of transaminase or lactic acid dehydrogenase (LDH) 09/18/2010   Anxiety 09/08/2010   Tachycardia 09/08/2010   Hyperlipemia 03/01/2006   TIA (transient ischemic attack) 03/01/2006   Tobacco use disorder 03/01/2006   Depressive disorder 10/16/2005    PCP: Wilfrid Lund, PA  REFERRING PROVIDER: Wilfrid Lund, PA  REFERRING DIAG: R53.1 (ICD-10-CM) - Weakness  Rationale for Evaluation and Treatment: Rehabilitation  THERAPY DIAG:  Muscle weakness (generalized)  Other abnormalities of gait and mobility  Chronic left shoulder pain  ONSET DATE: since childhood  SUBJECTIVE:  SUBJECTIVE STATEMENT: "I'm hurting today right above the floating rib on my left side, I'm not sure what caused it.    PERTINENT HISTORY:  HTN, TIA,  hx polio, L reverse TSA, multiple lumbar surgeries (most recent in 2018, fracture in 2023 see MRI below)  PAIN:  Pt w/ hx of MSK issues but states pain is well controlled with current regimen, denies any pain related to this episode  PRECAUTIONS: None  WEIGHT BEARING  RESTRICTIONS: No  FALLS:  Has patient fallen in last 6 months? No  LIVING ENVIRONMENT: 2 story house, avoids stairs due to RLE weakness, uses rails Lives w/ adult son, pt states they help take care of each other Also has a friend who helps with housework/yardwork   OCCUPATION: on disability   PLOF: Independent - active, enjoys [place] hunting   PATIENT GOALS: get stronger, core strengthh  NEXT MD VISIT: August  OBJECTIVE: (objective measures completed at initial evaluation unless otherwise dated)   DIAGNOSTIC FINDINGS:  Sept 2023 lumbar MRI: "IMPRESSION: 1. L1 and L5 superior endplate fractures with mild height loss, new since 2019. Limited associated marrow edema, likely recent but nearly healed. 2. L2-3 progressive adjacent segment degeneration with compressive spinal stenosis and moderate left foraminal narrowing. 3. L3-S1 solid fusion."  PATIENT SURVEYS:  FOTO 46 current, 56 predicted FOTO 08/06/22: 56 MET  SCREENING FOR RED FLAGS: Red flag questioning/screening reassuring    COGNITION: Overall cognitive status: Within functional limits for tasks assessed     SENSATION: Light touch intact B LE, does not endorse any neuro complaints  POSTURE: mild lateral shift to L, fwd head posture  PALPATION: Not indicated  LUMBAR ROM:   AROM eval  Flexion   Extension   Right lateral flexion   Left lateral flexion   Right rotation   Left rotation    (Blank rows = not tested) (Key: WFL = within functional limits not formally assessed, * = concordant pain, s = stiffness/stretching sensation, NT = not tested)   LOWER EXTREMITY ROM:     Active  Right eval Left eval  Hip flexion    Hip extension    Hip internal rotation    Hip external rotation    Knee extension    Knee flexion    (Blank rows = not tested) (Key: WFL = within functional limits not formally assessed, * = concordant pain, s = stiffness/stretching sensation, NT = not tested)  Comments:    LOWER  EXTREMITY MMT:    MMT Right eval Left eval R/L 08/17/22  Hip flexion 3+ 4 4-/4+  Hip abduction (modified sitting) 4 5 4+/5  Hip internal rotation     Hip external rotation     Knee flexion 3+ 5 4/5  Knee extension 3+ 5 4+/5  Ankle dorsiflexion 3- 5 4/5   (Blank rows = not tested) (Key: WFL = within functional limits not formally assessed, * = concordant pain, s = stiffness/stretching sensation, NT = not tested)  Comments: MMT painless   FUNCTIONAL TESTS:  5xSTS 16.40 sec no UE support, weight shift to R   BIL calf raise w/ UE balancing support x30 repetitions, increased ROM and weight shift for LLE consistent throughout repetitions, pt reports subjective improvement in ease of movement with repetition  08/17/22: 5xSTS 18.71sec no UE support symmetrical mechanics 12.47sec no UE support, mild weight shift to L   Stair assessment - able to perform reciprocal pattern without rail, mild instability but no LOB, rates at 3-4/10 RPE   GAIT:  Distance walked: within clinic Assistive device utilized: None Level of assistance: Complete Independence Comments: increased weight shift to L, reduced truncal ROM and arm swing, reduced stance time on R   FGA 08/17/22:  -Item 1 Gait Level Surface: mild impairment 2  -Item 2 Change in Gait Speed: mild impairment 2  -Item 3 Gait with Horizontal Head Turns: Normal 3  -Item 4 Gait with Vertical Head Turns: Normal 3  -Item 5 Gait with Pivot Turn: mild impairment 2  -Item 6 Step Over Obstacle:  mild impairment 2  -Item 7 Gait with Narrow Base of Support: moderate impairment 1  -Item 8 Gait with Eyes Closed: moderate impairment 1              -Item 9 Ambulating Backwards: mild impairment 2  -Item 10 Steps: mild impairment 2 Total: 20 /30  * Score of <=22/30 indicates that patient is at increased risk for falls.   TODAY'S TREATMENT:   [facility] Adult PT Treatment:                                                DATE: 09-10-22 Therapeutic Exercise: Nu  step LE only L5 x 6 min LE only - 1 min regular pace for warm up then sprinting first 30 sec of every minute followed by regular pace for remainder 30 sec of every min.  Standing hip abduction 3 x going to fatigue with RTB around ankles at counter for support Standing hip extension 3 x going to fagtigue with RTB around ankles at counter for support Sit to stand 1 x 10 with focus on eccentric control with 5 sec hold mid range before sitting down   Neuromuscular re-ed: Runner step up onto airex 2x13 RLE stance CGA cues for posture and stability for stance limb                                                                                                                         [facility] Adult PT Treatment:                                                DATE: 09/02/22 Therapeutic Exercise: Nu step LE only 4 min (1:30 slow, :30 sec RPE 6/10, 1:30 slow, :30 RPE 6) STS x6 w 10#, 15# 2x6 from mat cues  Change of pace farmer carries 5# BIL, (3 green 2 yellow cones) cues for pacing and velocity; 3 laps with weight, 2 laps without HEP update + review/education  Neuromuscular re-ed: Runner step up onto airex 2x13 RLE stance CGA cues for posture and stability for stance limb Tandem walk 3 laps along counter weaning UE support, CGA, cues for sequencing  Cone taps at counter  x10 BIL LE cues for stability    [facility] Adult PT Treatment:                                                DATE: 08/17/22 Therapeutic Exercise: Hamstring curl machine  single limb 25# 3x6 cues for pacing BIL knee extension machine 15# 2x12 cues for form  5# farmer carries change of pace (green cone, yellow, green, red) 4 laps cues for pacing  STS w 10# 2x5 cues for form and pacing   Therapeutic Activity: 5xSTS + education Stair assessment + education FGA + education Education/discussion re: progress with PT, symptom behavior as it affects activity tolerance, PT goals/POC    PATIENT EDUCATION:  Education details: rationale for  interventions, HEP, progress note, pt goals and PT POC Person educated: Patient Education method: Explanation, Demonstration, Tactile cues, Verbal cues, and Handouts Education comprehension: verbalized understanding, returned demonstration, verbal cues required, tactile cues required, and needs further education    HOME EXERCISE PROGRAM: Access Code: [email or web address] URL: [email or web address] Date: 09/02/2022 Prepared by: Fransisco Hertz  Program Notes with marches - do at counter tapping to empty water bottle as done in clinic  Exercises - Heel Raises with Counter Support  - 1 x daily - 7 x weekly - 2 sets - 12 reps - Staggered Sit-to-Stand  - 1 x daily - 7 x weekly - 3 sets - 5 reps - Seated Long Arc Quad  - 1 x daily - 7 x weekly - 3 sets - 10 reps - Standing 3-Way Leg Reach with Resistance at Ankles and Counter Support  - 1 x daily - 7 x weekly - 3 sets - 8 reps - Standing Marching  - 1 x daily - 7 x weekly - 3 sets - 10 reps  ASSESSMENT:  CLINICAL IMPRESSION: 09/10/2022 pt arrives to session noting soreness along his L flank/ ribs that started last week with no specific onset or MOI. Modified session to avoid UE holds to reduce potential rib aggravation. Continued LE strengthening with increased reps to maximize strength and endurance. Worked on dynamic balance on unstable surface with weaning UE assistance which he did well with. End of session he noted no changes in pain following last session.     EVALUATION: Pt is a pleasant 67 year old gentleman who arrives to PT evaluation on this date for LE weakness. Pt reports difficulty with static standing, stair navigation, balance, and higher level activities due to weakness. During today's session pt demonstrates generalized R LE weakness, altered functional mechanics, and gait impairments which are likely contributing to difficulty with aforementioned activities. Did discuss with pt that we will plan to monitor tolerance to  activity given reported hx of polio affecting this limb, and aim to accommodate treatment accordingly although he notes in the past he has responded well to PT/exercise. No adverse events during today's session, pt verbalizes agreement with plan below. Recommend skilled PT to address aforementioned deficits to improve functional independence/tolerance. Pt departs today's session in no acute distress, all voiced questions/concerns addressed appropriately from PT perspective.    OBJECTIVE IMPAIRMENTS: Abnormal gait, decreased activity tolerance, decreased balance, decreased endurance, decreased mobility, difficulty walking, decreased ROM, decreased strength, improper body mechanics, postural dysfunction, and pain.   ACTIVITY LIMITATIONS: carrying, lifting, standing, stairs, transfers, and locomotion level  PARTICIPATION LIMITATIONS: community activity and yard work  PERSONAL  FACTORS: Time since onset of injury/illness/exacerbation and 3+ comorbidities: L rTSA, HTN, hx polio, multiple back surgeries  are also affecting patient's functional outcome.   REHAB POTENTIAL: Good  CLINICAL DECISION MAKING: Stable/uncomplicated  EVALUATION COMPLEXITY: Low   GOALS: Goals reviewed with patient? No  SHORT TERM GOALS: Target date: 07/28/2022 Pt will demonstrate appropriate understanding and performance of initially prescribed HEP in order to facilitate improved independence with management of symptoms.  Baseline: HEP provided on eval 08/06/22: good adherence reported  Goal status: MET   2. Pt will score greater than or equal to 51 on FOTO in order to demonstrate improved perception of function due to symptoms.  Baseline: 46  08/06/22: 56   Goal status: MET  LONG TERM GOALS: Target date: 09/14/2022 (updated 08/17/22) Pt will score 56 on FOTO in order to demonstrate improved perception of functional status due to symptoms.  Baseline: 46 08/17/22: 56 Goal status: MET   2.  Pt will report/demonstrate  ability to stand for up to without increase in instability/discomfort in order to improve tolerance to ADLs. Baseline: fatigue/instability after 10-99min 08/17/22: varies, >97min from back perspective  Goal status: ONGOING  3.  Pt will demonstrate at least 1pt increase on MMT scale globally throughout RLE in order to promote improved functional strength. Baseline: see MMT chart above 08/17/22: see MMT chart above  Goal status: MET  4. Pt will perform 5xSTS in =/<10 sec in order to demonstrate reduced fall risk and improved functional independence. (MCID of 2.3sec, cutoff score for fall risk <12-14sec in most populations)  Baseline: 16sec no UE support, altered kinematics  08/17/22: 12 sec (MET INITIAL goal =/<12sec)  Goal status: REVISED 08/17/22  5. Pt will demonstrate ability to safely navigate a full flight of stairs using unilateral rail and reported RPE <5/10 in order to promote improved home navigation.  Baseline: states he avoids stairs in home due to difficulty  08/17/22: able to perform without rails, mild instability no LOB  Goal status: PARTIALLY MET   6. Pt will score greater than or equal to 22/30 on Functional Gait assessment in order to indicate reduced fall risk (cutoff score </= 22/30 predictive of falls per Alvino Chapel et al 2010, MCID 4 pts Beninato et al 2014)  Baseline: 20/30  Goal status: NEW 08/17/22   PLAN: (updated 08/17/22)  PT FREQUENCY: 2x/week  PT DURATION: 4 weeks  PLANNED INTERVENTIONS: Therapeutic exercises, Therapeutic activity, Neuromuscular re-education, Balance training, Gait training, Patient/Family education, Self Care, Stair training, Vestibular training, Aquatic Therapy, Cryotherapy, Moist heat, Taping, Manual therapy, and Re-evaluation.  PLAN FOR NEXT SESSION: continue to work on LE strength, begin working more so on higher level balance, stair navigation, change of pace activities.  ERO next session.   Jisselle Poth PT, DPT, LAT, ATC   09/10/22  10:17 AM

## 2022-09-14 NOTE — Therapy (Incomplete)
OUTPATIENT PHYSICAL THERAPY PROGRESS NOTE + RECERTIFICATION   Patient Name: Victor Ramirez MRN: [id number] DOB:September 09, 1955, 67 y.o., male Today's Date: 09/14/2022   Progress Note Reporting Period 07/07/22 to 09/15/22  See note below for Objective Data and Assessment of Progress/Goals.      END OF SESSION:       Past Medical History:  Diagnosis Date   Arthritis    Hemochromatosis    Hypertension    Past Surgical History:  Procedure Laterality Date   ANTERIOR LATERAL LUMBAR FUSION WITH PERCUTANEOUS SCREW 1 LEVEL Right 11/27/2016   Procedure: Lumbar Three-Four Transpsoas Lumbar InterbodyFfusion;  Surgeon: Ditty, Loura Halt, MD;  Location: [facility] OR;  Service: Neurosurgery;  Laterality: Right;  L3-4 Transpsoas lumbar interbody fusion/L3-4 Pedicle screw fixation with posterolateral arthrodesis/minimally invasive decompression/Mazor   APPLICATION OF ROBOTIC ASSISTANCE FOR SPINAL PROCEDURE N/A 11/27/2016   Procedure: Lumbar Three-Four Pedicle Screw Fixation with Posterolateral Arthrodesis with Minimally Invasive Decompression with Application of Robotic Assistance;  Surgeon: Ditty, Loura Halt, MD;  Location: [facility] OR;  Service: Neurosurgery;  Laterality: N/A;   BACK SURGERY     x3    BACK SURGERY     battery surgery    REVERSE SHOULDER ARTHROPLASTY Left 09/16/2021   Procedure: LEFT REVERSE SHOULDER ARTHROPLASTY;  Surgeon: Cammy Copa, MD;  Location: [facility] OR;  Service: Orthopedics;  Laterality: Left;   spinal manipulation under anesthesia     TONSILLECTOMY     Patient Active Problem List   Diagnosis Date Noted   Vitamin D deficiency 10/15/2021   Osteoporosis 10/15/2021   OA (osteoarthritis) of shoulder 09/16/2021   S/P reverse total shoulder arthroplasty, left 09/16/2021   Pain in right wrist 05/12/2021   RUQ abdominal pain    Acute pancreatitis without infection or necrosis 12/02/2020   Pain in left shoulder 11/28/2020   Rheumatoid factor positive 08/29/2020    Polymyalgia rheumatica (HCC) 08/08/2020   Polyarthralgia 08/08/2020   High risk medication use 08/08/2020   Lumbosacral spondylosis with radiculopathy 11/27/2016   Atrophy of muscle of right lower leg 02/04/2014   History of post-polio syndrome 02/02/2014   Pain of left lower extremity 02/02/2014   Chronic back pain 05/14/2013   HTN (hypertension) 05/14/2013   Headache 12/22/2011   Sleep apnea 11/17/2011   Elevation of level of transaminase or lactic acid dehydrogenase (LDH) 09/18/2010   Anxiety 09/08/2010   Tachycardia 09/08/2010   Hyperlipemia 03/01/2006   TIA (transient ischemic attack) 03/01/2006   Tobacco use disorder 03/01/2006   Depressive disorder 10/16/2005    PCP: Wilfrid Lund, PA  REFERRING PROVIDER: Wilfrid Lund, PA  REFERRING DIAG: R53.1 (ICD-10-CM) - Weakness  Rationale for Evaluation and Treatment: Rehabilitation  THERAPY DIAG:  No diagnosis found.  ONSET DATE: since childhood  SUBJECTIVE:  SUBJECTIVE STATEMENT: ***  *** "I'm hurting today right above the floating rib on my left side, I'm not sure what caused it.    PERTINENT HISTORY:  HTN, TIA,  hx polio, L reverse TSA, multiple lumbar surgeries (most recent in 2018, fracture in 2023 see MRI below)  PAIN:  Pt w/ hx of MSK issues but states pain is well controlled with current regimen, denies any pain related to this episode  PRECAUTIONS: None  WEIGHT BEARING RESTRICTIONS: No  FALLS:  Has patient fallen in last 6 months? No  LIVING ENVIRONMENT: 2 story house, avoids stairs due to RLE weakness, uses rails Lives w/ adult son, pt states they help take care of each other Also has a friend who helps with housework/yardwork   OCCUPATION: on disability   PLOF: Independent - active, enjoys [place] hunting    PATIENT GOALS: get stronger, core strengthh  NEXT MD VISIT: August  OBJECTIVE: (objective measures completed at initial evaluation unless otherwise dated)   DIAGNOSTIC FINDINGS:  Sept 2023 lumbar MRI: "IMPRESSION: 1. L1 and L5 superior endplate fractures with mild height loss, new since 2019. Limited associated marrow edema, likely recent but nearly healed. 2. L2-3 progressive adjacent segment degeneration with compressive spinal stenosis and moderate left foraminal narrowing. 3. L3-S1 solid fusion."  PATIENT SURVEYS:  FOTO 46 current, 56 predicted FOTO 08/06/22: 56 MET  SCREENING FOR RED FLAGS: Red flag questioning/screening reassuring    COGNITION: Overall cognitive status: Within functional limits for tasks assessed     SENSATION: Light touch intact B LE, does not endorse any neuro complaints  POSTURE: mild lateral shift to L, fwd head posture  PALPATION: Not indicated  LUMBAR ROM:   AROM eval  Flexion   Extension   Right lateral flexion   Left lateral flexion   Right rotation   Left rotation    (Blank rows = not tested) (Key: WFL = within functional limits not formally assessed, * = concordant pain, s = stiffness/stretching sensation, NT = not tested)   LOWER EXTREMITY ROM:     Active  Right eval Left eval  Hip flexion    Hip extension    Hip internal rotation    Hip external rotation    Knee extension    Knee flexion    (Blank rows = not tested) (Key: WFL = within functional limits not formally assessed, * = concordant pain, s = stiffness/stretching sensation, NT = not tested)  Comments:    LOWER EXTREMITY MMT:    MMT Right eval Left eval R/L 08/17/22  Hip flexion 3+ 4 4-/4+  Hip abduction (modified sitting) 4 5 4+/5  Hip internal rotation     Hip external rotation     Knee flexion 3+ 5 4/5  Knee extension 3+ 5 4+/5  Ankle dorsiflexion 3- 5 4/5   (Blank rows = not tested) (Key: WFL = within functional limits not formally assessed, * =  concordant pain, s = stiffness/stretching sensation, NT = not tested)  Comments: MMT painless   FUNCTIONAL TESTS:  5xSTS 16.40 sec no UE support, weight shift to R   BIL calf raise w/ UE balancing support x30 repetitions, increased ROM and weight shift for LLE consistent throughout repetitions, pt reports subjective improvement in ease of movement with repetition  08/17/22: 5xSTS 18.71sec no UE support symmetrical mechanics 12.47sec no UE support, mild weight shift to L   Stair assessment - able to perform reciprocal pattern without rail, mild instability but no LOB, rates at 3-4/10 RPE  GAIT: Distance walked: within clinic Assistive device utilized: None Level of assistance: Complete Independence Comments: increased weight shift to L, reduced truncal ROM and arm swing, reduced stance time on R   FGA 08/17/22:  -Item 1 Gait Level Surface: mild impairment 2  -Item 2 Change in Gait Speed: mild impairment 2  -Item 3 Gait with Horizontal Head Turns: Normal 3  -Item 4 Gait with Vertical Head Turns: Normal 3  -Item 5 Gait with Pivot Turn: mild impairment 2  -Item 6 Step Over Obstacle:  mild impairment 2  -Item 7 Gait with Narrow Base of Support: moderate impairment 1  -Item 8 Gait with Eyes Closed: moderate impairment 1              -Item 9 Ambulating Backwards: mild impairment 2  -Item 10 Steps: mild impairment 2 Total: 20 /30  * Score of <=22/30 indicates that patient is at increased risk for falls.    09/15/22 FGA:  -Item 1 Gait Level Surface: Normal 3 / mild impairment 2 / moderate impairment 1  / severe impairment 0 -Item 2 Change in Gait Speed: Normal 3 / mild impairment 2 / moderate impairment 1  / severe impairment 0 -Item 3 Gait with Horizontal Head Turns: Normal 3 / mild impairment 2 / moderate impairment 1  / severe impairment 0 -Item 4 Gait with Vertical Head Turns: Normal 3 / mild impairment 2 / moderate impairment 1  / severe impairment 0 -Item 5 Gait with Pivot Turn:  Normal 3 / mild impairment 2 / moderate impairment 1  / severe impairment 0 -Item 6 Step Over Obstacle: Normal 3 / mild impairment 2 / moderate impairment 1  / severe impairment 0 -Item 7 Gait with Narrow Base of Support: Normal 3 / mild impairment 2 / moderate impairment 1  / severe impairment 0 -Item 8 Gait with Eyes Closed: Normal 3 / mild impairment 2 / moderate impairment 1  / severe impairment 0            -Item 9 Ambulating Backwards: Normal 3 / mild impairment 2 / moderate impairment 1  / severe impairment 0 -Item 10 Steps: Normal 3 / mild impairment 2 / moderate impairment 1  / severe impairment 0 Total: *** /30  * Score of <=22/30 indicates that patient is at increased risk for falls.   TODAY'S TREATMENT:   [facility] Adult PT Treatment:                                                DATE: 09/15/22 Therapeutic Exercise: *** Manual Therapy: *** Neuromuscular re-ed: *** Therapeutic Activity: *** Modalities: *** Self Care: ***    [facility] Adult PT Treatment:                                                DATE: 09-10-22 Therapeutic Exercise: Nu step LE only L5 x 6 min LE only - 1 min regular pace for warm up then sprinting first 30 sec of every minute followed by regular pace for remainder 30 sec of every min.  Standing hip abduction 3 x going to fatigue with RTB around ankles at counter for support Standing hip extension 3 x going to fagtigue with RTB  around ankles at counter for support Sit to stand 1 x 10 with focus on eccentric control with 5 sec hold mid range before sitting down   Neuromuscular re-ed: Runner step up onto airex 2x13 RLE stance CGA cues for posture and stability for stance limb                                                                                                                         [facility] Adult PT Treatment:                                                DATE: 09/02/22 Therapeutic Exercise: Nu step LE only 4 min (1:30 slow, :30 sec RPE 6/10, 1:30 slow,  :30 RPE 6) STS x6 w 10#, 15# 2x6 from mat cues  Change of pace farmer carries 5# BIL, (3 green 2 yellow cones) cues for pacing and velocity; 3 laps with weight, 2 laps without HEP update + review/education  Neuromuscular re-ed: Runner step up onto airex 2x13 RLE stance CGA cues for posture and stability for stance limb Tandem walk 3 laps along counter weaning UE support, CGA, cues for sequencing  Cone taps at counter x10 BIL LE cues for stability    [facility] Adult PT Treatment:                                                DATE: 08/17/22 Therapeutic Exercise: Hamstring curl machine  single limb 25# 3x6 cues for pacing BIL knee extension machine 15# 2x12 cues for form  5# farmer carries change of pace (green cone, yellow, green, red) 4 laps cues for pacing  STS w 10# 2x5 cues for form and pacing   Therapeutic Activity: 5xSTS + education Stair assessment + education FGA + education Education/discussion re: progress with PT, symptom behavior as it affects activity tolerance, PT goals/POC    PATIENT EDUCATION:  Education details: rationale for interventions, HEP, progress note, pt goals and PT POC ***  Person educated: Patient Education method: Explanation, Demonstration, Tactile cues, Verbal cues, and Handouts Education comprehension: verbalized understanding, returned demonstration, verbal cues required, tactile cues required, and needs further education    HOME EXERCISE PROGRAM: Access Code: [email or web address] URL: [email or web address] Date: 09/02/2022 Prepared by: Fransisco Hertz  Program Notes with marches - do at counter tapping to empty water bottle as done in clinic  Exercises - Heel Raises with Counter Support  - 1 x daily - 7 x weekly - 2 sets - 12 reps - Staggered Sit-to-Stand  - 1 x daily - 7 x weekly - 3 sets - 5 reps - Seated Long Arc Quad  - 1 x daily - 7 x weekly - 3 sets - 10  reps - Standing 3-Way Leg Reach with Resistance at Ankles and Counter Support  - 1 x  daily - 7 x weekly - 3 sets - 8 reps - Standing Marching  - 1 x daily - 7 x weekly - 3 sets - 10 reps  ASSESSMENT:  CLINICAL IMPRESSION: 09/14/2022 ***  *** pt arrives to session noting soreness along his L flank/ ribs that started last week with no specific onset or MOI. Modified session to avoid UE holds to reduce potential rib aggravation. Continued LE strengthening with increased reps to maximize strength and endurance. Worked on dynamic balance on unstable surface with weaning UE assistance which he did well with. End of session he noted no changes in pain following last session.     EVALUATION: Pt is a pleasant 67 year old gentleman who arrives to PT evaluation on this date for LE weakness. Pt reports difficulty with static standing, stair navigation, balance, and higher level activities due to weakness. During today's session pt demonstrates generalized R LE weakness, altered functional mechanics, and gait impairments which are likely contributing to difficulty with aforementioned activities. Did discuss with pt that we will plan to monitor tolerance to activity given reported hx of polio affecting this limb, and aim to accommodate treatment accordingly although he notes in the past he has responded well to PT/exercise. No adverse events during today's session, pt verbalizes agreement with plan below. Recommend skilled PT to address aforementioned deficits to improve functional independence/tolerance. Pt departs today's session in no acute distress, all voiced questions/concerns addressed appropriately from PT perspective.    OBJECTIVE IMPAIRMENTS: Abnormal gait, decreased activity tolerance, decreased balance, decreased endurance, decreased mobility, difficulty walking, decreased ROM, decreased strength, improper body mechanics, postural dysfunction, and pain.   ACTIVITY LIMITATIONS: carrying, lifting, standing, stairs, transfers, and locomotion level  PARTICIPATION LIMITATIONS: community  activity and yard work  PERSONAL FACTORS: Time since onset of injury/illness/exacerbation and 3+ comorbidities: L rTSA, HTN, hx polio, multiple back surgeries  are also affecting patient's functional outcome.   REHAB POTENTIAL: Good  CLINICAL DECISION MAKING: Stable/uncomplicated  EVALUATION COMPLEXITY: Low   GOALS: Goals reviewed with patient? No  SHORT TERM GOALS: Target date: 07/28/2022 Pt will demonstrate appropriate understanding and performance of initially prescribed HEP in order to facilitate improved independence with management of symptoms.  Baseline: HEP provided on eval 08/06/22: good adherence reported  Goal status: MET   2. Pt will score greater than or equal to 51 on FOTO in order to demonstrate improved perception of function due to symptoms.  Baseline: 46  08/06/22: 56   Goal status: MET  LONG TERM GOALS: Target date: 09/14/2022 (updated 08/17/22) ***  Pt will score 56 on FOTO in order to demonstrate improved perception of functional status due to symptoms.  Baseline: 46 08/17/22: 56 Goal status: MET   2.  Pt will report/demonstrate ability to stand for up to without increase in instability/discomfort in order to improve tolerance to ADLs. Baseline: fatigue/instability after 10-30min 08/17/22: varies, >30min from back perspective  Goal status: ONGOING  3.  Pt will demonstrate at least 1pt increase on MMT scale globally throughout RLE in order to promote improved functional strength. Baseline: see MMT chart above 08/17/22: see MMT chart above  Goal status: MET  4. Pt will perform 5xSTS in =/<10 sec in order to demonstrate reduced fall risk and improved functional independence. (MCID of 2.3sec, cutoff score for fall risk <12-14sec in most populations)  Baseline: 16sec no UE support, altered kinematics  08/17/22:  12 sec (MET INITIAL goal =/<12sec)  09/15/22: ***   Goal status: REVISED 08/17/22  5. Pt will demonstrate ability to safely navigate a full flight of  stairs using unilateral rail and reported RPE <5/10 in order to promote improved home navigation.  Baseline: states he avoids stairs in home due to difficulty  08/17/22: able to perform without rails, mild instability no LOB  09/15/22: ***   Goal status: PARTIALLY MET   6. Pt will score greater than or equal to 22/30 on Functional Gait assessment in order to indicate reduced fall risk (cutoff score </= 22/30 predictive of falls per Alvino Chapel et al 2010, MCID 4 pts Beninato et al 2014)  Baseline: 20/30  09/15/22: ***   Goal status: ***   PLAN: *** 09/15/22  PT FREQUENCY: 2x/week ***   PT DURATION: 4 weeks ***   PLANNED INTERVENTIONS: Therapeutic exercises, Therapeutic activity, Neuromuscular re-education, Balance training, Gait training, Patient/Family education, Self Care, Stair training, Vestibular training, Aquatic Therapy, Cryotherapy, Moist heat, Taping, Manual therapy, and Re-evaluation.  PLAN FOR NEXT SESSION: continue to work on LE strength, begin working more so on higher level balance, stair navigation, change of pace activities.  ERO next session. ***    Ashley Murrain PT, DPT 09/14/2022 9:41 AM

## 2022-09-15 ENCOUNTER — Encounter: Payer: Self-pay | Admitting: Physical Therapy

## 2022-09-15 ENCOUNTER — Ambulatory Visit: Payer: Medicare Other | Admitting: Physical Therapy

## 2022-09-15 DIAGNOSIS — R2689 Other abnormalities of gait and mobility: Secondary | ICD-10-CM | POA: Diagnosis not present

## 2022-09-15 DIAGNOSIS — M6281 Muscle weakness (generalized): Secondary | ICD-10-CM

## 2022-09-15 DIAGNOSIS — G8929 Other chronic pain: Secondary | ICD-10-CM | POA: Diagnosis not present

## 2022-09-15 DIAGNOSIS — M25512 Pain in left shoulder: Secondary | ICD-10-CM | POA: Diagnosis not present

## 2022-09-15 NOTE — Addendum Note (Signed)
Addended by: Laurine Blazer, Jerri A on: 09/15/2022 12:29 PM   Modules accepted: Orders

## 2022-09-17 ENCOUNTER — Ambulatory Visit: Payer: Medicare Other | Admitting: Physical Therapy

## 2022-09-22 ENCOUNTER — Other Ambulatory Visit: Payer: Self-pay

## 2022-09-22 ENCOUNTER — Ambulatory Visit: Payer: Medicare Other | Admitting: Physical Therapy

## 2022-09-22 DIAGNOSIS — I7143 Infrarenal abdominal aortic aneurysm, without rupture: Secondary | ICD-10-CM

## 2022-09-24 ENCOUNTER — Ambulatory Visit: Payer: Medicare Other | Admitting: Physical Therapy

## 2022-09-25 ENCOUNTER — Telehealth: Payer: Self-pay | Admitting: *Deleted

## 2022-09-25 DIAGNOSIS — R718 Other abnormality of red blood cells: Secondary | ICD-10-CM | POA: Diagnosis not present

## 2022-09-25 DIAGNOSIS — R238 Other skin changes: Secondary | ICD-10-CM | POA: Diagnosis not present

## 2022-09-25 DIAGNOSIS — R634 Abnormal weight loss: Secondary | ICD-10-CM | POA: Diagnosis not present

## 2022-09-25 DIAGNOSIS — D229 Melanocytic nevi, unspecified: Secondary | ICD-10-CM | POA: Diagnosis not present

## 2022-09-25 DIAGNOSIS — M353 Polymyalgia rheumatica: Secondary | ICD-10-CM | POA: Diagnosis not present

## 2022-09-25 DIAGNOSIS — F1920 Other psychoactive substance dependence, uncomplicated: Secondary | ICD-10-CM | POA: Diagnosis not present

## 2022-09-25 NOTE — Telephone Encounter (Signed)
Patient contacted the office to inquire about a dermatology referral. Patient states he was contacted by the dermatologist to schedule an appointment but he advised the person on the phone he wanted to talk to our office to discuss why he was being referred. He states the dermatology office advised him we referred him. Patient advised we have not referred him to the dermatologist. Patient states he has been having bruising of his skin and it tears easily. Patient states he has a mole that has tripled in size. Patient states he has lost 19 pounds in the last 8 months. Patient states he will contact his PCP to see if they have referred him to dermatology.

## 2022-09-29 ENCOUNTER — Ambulatory Visit: Payer: Medicare Other | Admitting: Physical Therapy

## 2022-10-01 ENCOUNTER — Encounter: Payer: Medicare Other | Admitting: Physical Therapy
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
## 2022-10-28 ENCOUNTER — Ambulatory Visit: Payer: Medicare Other | Attending: Internal Medicine | Admitting: Internal Medicine

## 2022-10-28 ENCOUNTER — Encounter: Payer: Self-pay | Admitting: Internal Medicine

## 2022-10-28 VITALS — BP 119/77 | HR 88 | Resp 14 | Ht 68.0 in | Wt 161.0 lb

## 2022-10-28 DIAGNOSIS — I10 Essential (primary) hypertension: Secondary | ICD-10-CM

## 2022-10-28 DIAGNOSIS — M353 Polymyalgia rheumatica: Secondary | ICD-10-CM

## 2022-10-28 DIAGNOSIS — Z7952 Long term (current) use of systemic steroids: Secondary | ICD-10-CM

## 2022-10-28 DIAGNOSIS — M8000XD Age-related osteoporosis with current pathological fracture, unspecified site, subsequent encounter for fracture with routine healing: Secondary | ICD-10-CM

## 2022-10-28 DIAGNOSIS — Z79899 Other long term (current) drug therapy: Secondary | ICD-10-CM

## 2022-10-28 DIAGNOSIS — R768 Other specified abnormal immunological findings in serum: Secondary | ICD-10-CM

## 2022-10-29 DIAGNOSIS — M5416 Radiculopathy, lumbar region: Secondary | ICD-10-CM | POA: Diagnosis not present

## 2022-11-11 DIAGNOSIS — M5416 Radiculopathy, lumbar region: Secondary | ICD-10-CM | POA: Diagnosis not present

## 2022-12-15 DIAGNOSIS — G8929 Other chronic pain: Secondary | ICD-10-CM | POA: Diagnosis not present

## 2022-12-15 DIAGNOSIS — E7800 Pure hypercholesterolemia, unspecified: Secondary | ICD-10-CM | POA: Diagnosis not present

## 2022-12-15 DIAGNOSIS — F321 Major depressive disorder, single episode, moderate: Secondary | ICD-10-CM | POA: Diagnosis not present

## 2022-12-15 DIAGNOSIS — I7143 Infrarenal abdominal aortic aneurysm, without rupture: Secondary | ICD-10-CM | POA: Diagnosis not present

## 2022-12-15 DIAGNOSIS — R634 Abnormal weight loss: Secondary | ICD-10-CM | POA: Diagnosis not present

## 2022-12-15 DIAGNOSIS — M353 Polymyalgia rheumatica: Secondary | ICD-10-CM | POA: Diagnosis not present

## 2022-12-15 DIAGNOSIS — I10 Essential (primary) hypertension: Secondary | ICD-10-CM | POA: Diagnosis not present

## 2022-12-15 DIAGNOSIS — R2689 Other abnormalities of gait and mobility: Secondary | ICD-10-CM | POA: Diagnosis not present

## 2022-12-23 DIAGNOSIS — S50811A Abrasion of right forearm, initial encounter: Secondary | ICD-10-CM | POA: Diagnosis not present

## 2022-12-23 DIAGNOSIS — D692 Other nonthrombocytopenic purpura: Secondary | ICD-10-CM | POA: Diagnosis not present

## 2022-12-23 DIAGNOSIS — D1801 Hemangioma of skin and subcutaneous tissue: Secondary | ICD-10-CM | POA: Diagnosis not present

## 2022-12-23 DIAGNOSIS — S60511A Abrasion of right hand, initial encounter: Secondary | ICD-10-CM | POA: Diagnosis not present

## 2022-12-23 DIAGNOSIS — S50812A Abrasion of left forearm, initial encounter: Secondary | ICD-10-CM | POA: Diagnosis not present

## 2022-12-23 DIAGNOSIS — D485 Neoplasm of uncertain behavior of skin: Secondary | ICD-10-CM | POA: Diagnosis not present

## 2022-12-23 IMAGING — MR MR SHOULDER*L* W/ CM
5 series · 40 of 40 positions shown · IV contrast (agent unspecified)
Comparison: Radiographs dated May 12, 2021

CLINICAL DATA: Left shoulder pain, question injury lifting weights.

EXAM:
MR ARTHROGRAM OF THE LEFT SHOULDER
TECHNIQUE: Multiplanar, multisequence MR imaging of the left shoulder was
performed following the administration of intra-articular contrast.
CONTRAST:  See Injection Documentation.

[Series 3: T1 fat-sat · axial · 4.0mm · 0.27mm/px · z∈[-54,+25]mm · 7 of 18 slices shown (1 of 3)]
[im 1/18]
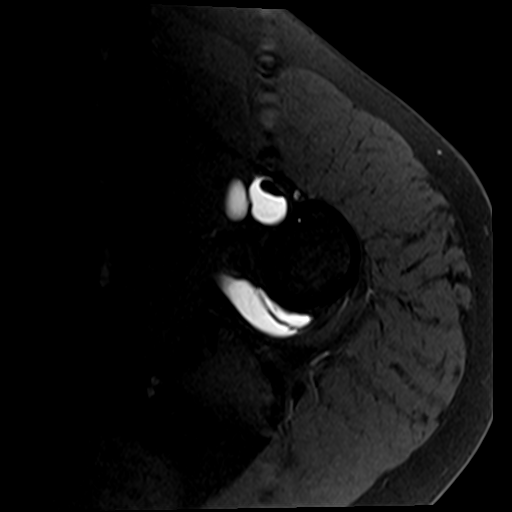
[im 3/18]
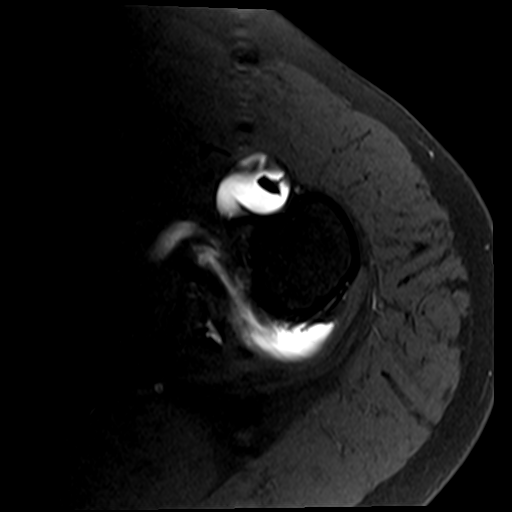
[im 6/18]
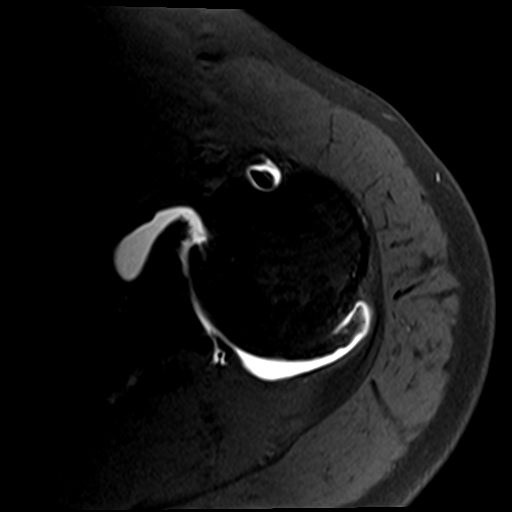
[im 9/18]
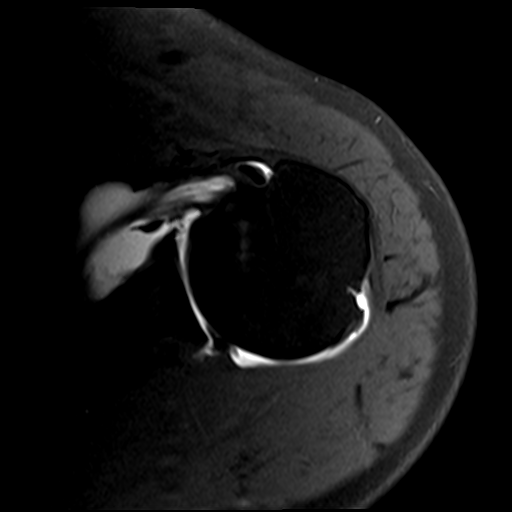
[im 12/18]
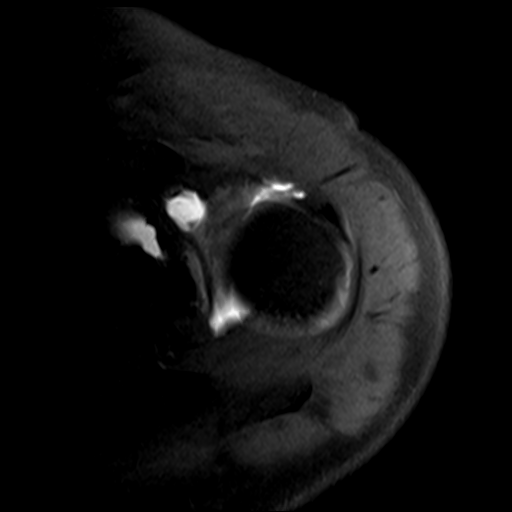
[im 15/18]
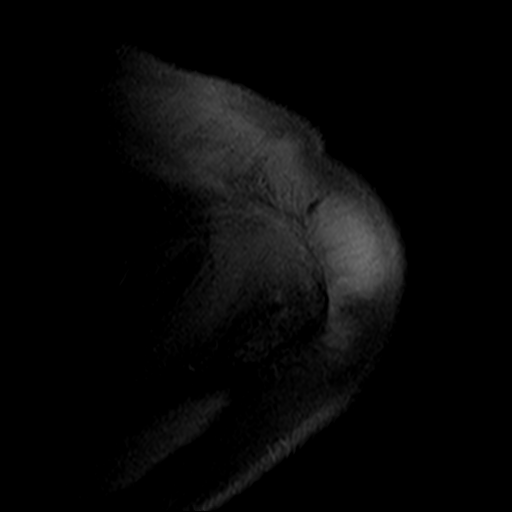
[im 18/18]
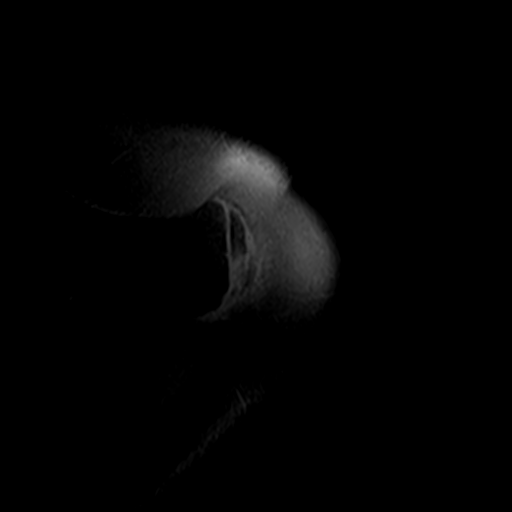

[Series 4: T2 fat-sat · oblique · 4.0mm · 0.55mm/px · 9 of 22 slices shown (1 of 2)]
[im 1/22]
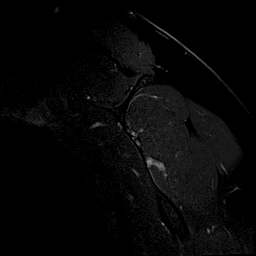
[im 3/22]
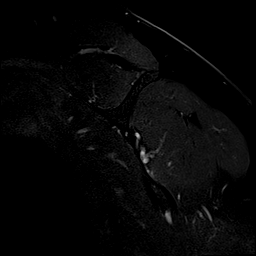
[im 6/22]
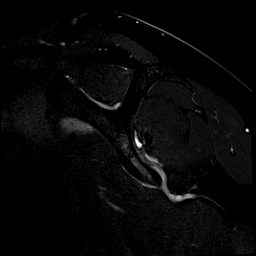
[im 8/22]
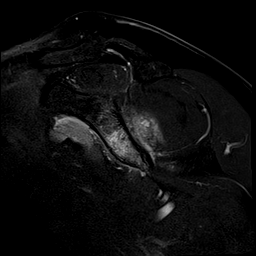
[im 11/22]
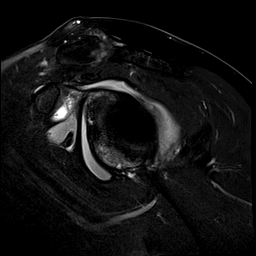
[im 14/22]
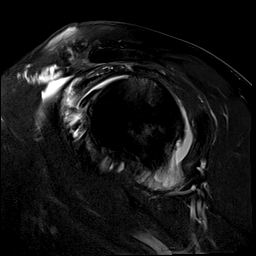
[im 16/22]
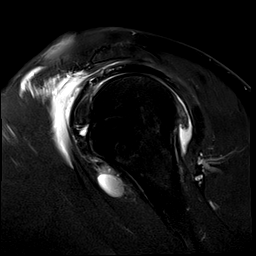
[im 19/22]
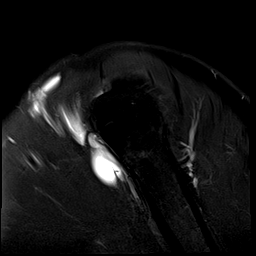
[im 22/22]
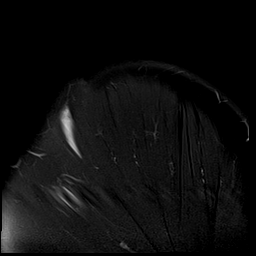

[Series 5: T1 fat-sat · oblique · 4.0mm · 0.55mm/px · 8 of 18 slices shown (2 of 3)]
[im 1/18]
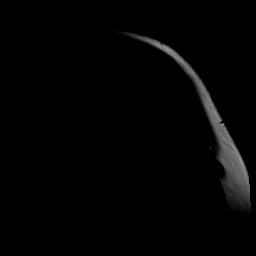
[im 3/18]
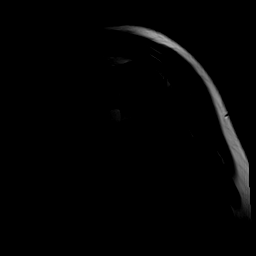
[im 5/18]
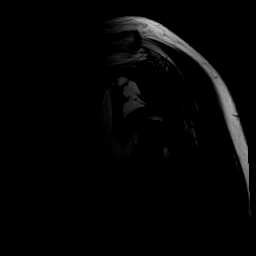
[im 8/18]
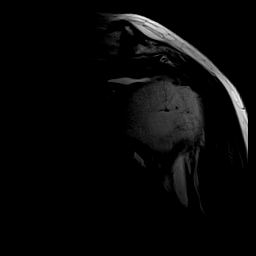
[im 10/18]
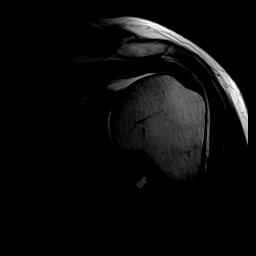
[im 13/18]
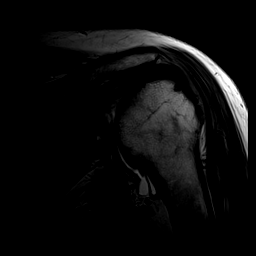
[im 15/18]
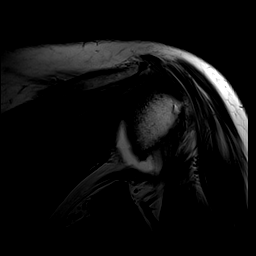
[im 18/18]
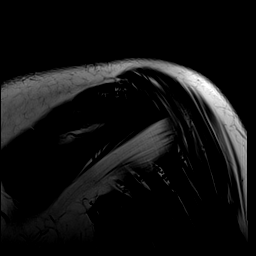

[Series 6: T1 fat-sat · oblique · 4.0mm · 0.55mm/px · 8 of 18 slices shown (3 of 3)]
[im 1/18]
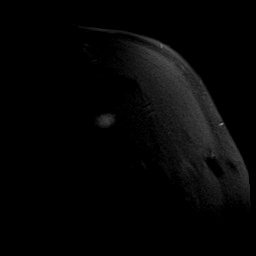
[im 3/18]
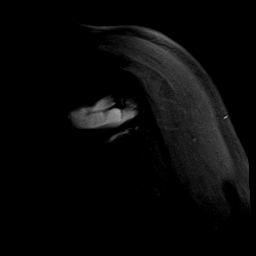
[im 5/18]
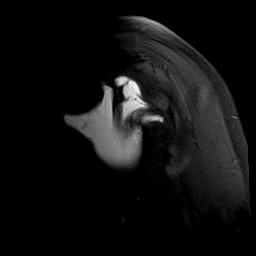
[im 8/18]
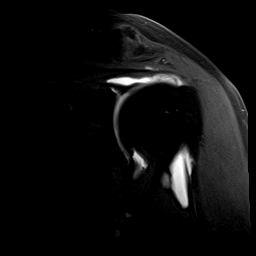
[im 10/18]
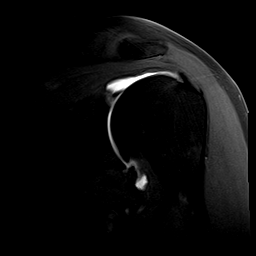
[im 13/18]
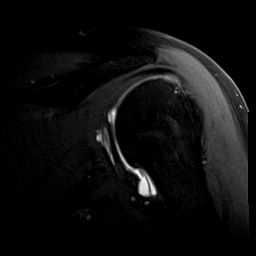
[im 15/18]
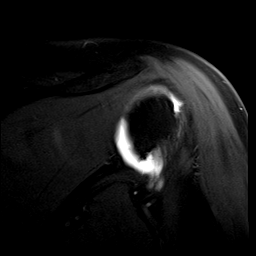
[im 18/18]
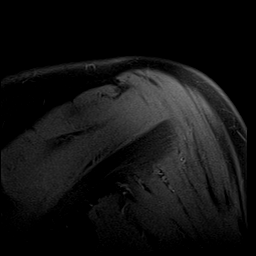

[Series 7: T2 fat-sat · oblique · 4.0mm · 0.55mm/px · 8 of 18 slices shown (2 of 2)]
[im 1/18]
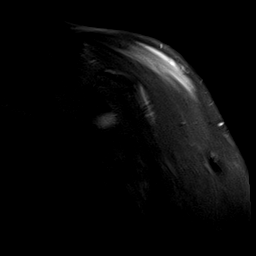
[im 3/18]
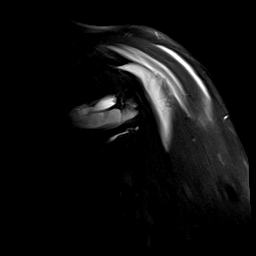
[im 5/18]
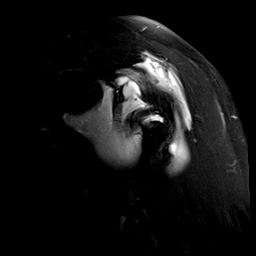
[im 8/18]
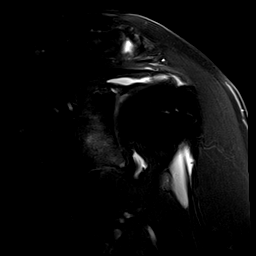
[im 10/18]
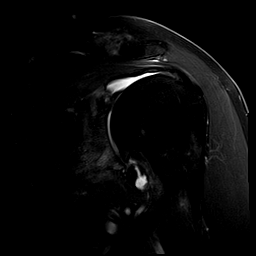
[im 13/18]
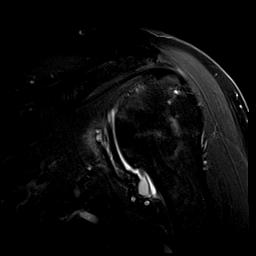
[im 15/18]
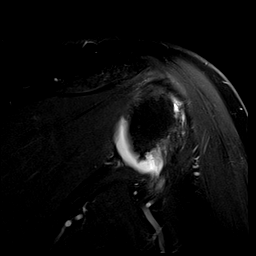
[im 18/18]
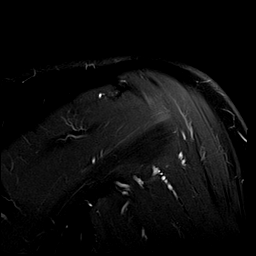

[40 of 40 positions shown; findings below may reference images not displayed]

FINDINGS: Rotator cuff: Tendinopathy of the supraspinatus with high-grade
partial-thickness (greater than 50%) articular surface insertional
tear (series 7, image 11; series 4, image 5). Tendinopathy with
low-grade partial-thickness insertional tear. Teres minor tendon is
intact. Subscapularis tendon is intact.

Muscles: No muscle atrophy or edema. No intramuscular fluid
collection or hematoma.

Biceps Long Head: Linear intrasubstance signal of the
intra-articular long head of the biceps concerning for tendinopathy
with intrasubstance tear.

Acromioclavicular Joint: Moderate arthropathy of the
acromioclavicular joint. No subacromial/subdeltoid bursal fluid.

Glenohumeral Joint: Intraarticular contrast distending the joint
capsule. Normal glenohumeral ligaments. Full-thickness cartilage
defect of the anterosuperior glenoid with subchondral edema (series
7, image 11; series 4, image 13)

Labrum: Degenerative changes with anteroinferior labral tear, likely
chronic process.

Bones: No fracture or dislocation. No aggressive osseous lesion.

Other: No fluid collection or hematoma.
IMPRESSION: 1. Tendinopathy of the supraspinatus with high-grade
partial-thickness (> 50%) articular surface tear about the
footprint.

2. Tendinopathy with low-grade partial-thickness tear of the
infraspinatus tendon (less than 50%).

3. Tendinopathy with intrasubstance tear of the long head of the
biceps.

4.  Moderate acromioclavicular osteoarthritis.

5. Moderate glenohumeral osteoarthritis characterized chronic
degenerative labral tear, full-thickness cartilage loss of the
labrum and subchondral edema/cystic changes of the glenoid as well
as femoral head neck junction osteophytes.

6.  No appreciable muscle atrophy.

## 2022-12-23 IMAGING — MR MR CERVICAL SPINE W/O CM
5 of 6 series · 28 of 48 positions shown · non-contrast
Comparison: Radiographs May 12, 2021.

CLINICAL DATA: Left arm pain [id number] ([id number]).

EXAM:
MRI CERVICAL SPINE WITHOUT CONTRAST
TECHNIQUE: Multiplanar, multisequence MR imaging of the cervical spine was
performed. No intravenous contrast was administered.

[Series 2: T2 · sagittal · 3.0mm · 0.66mm/px · 6 of 15 slices shown (1 of 2)]
[im 1/15]
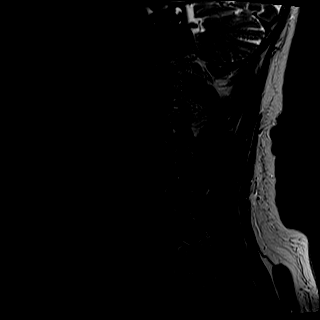
[im 3/15]
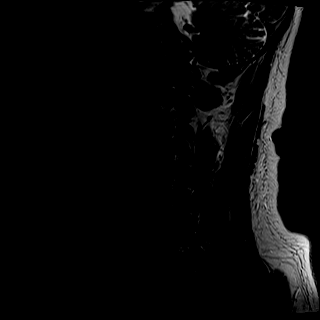
[im 6/15]
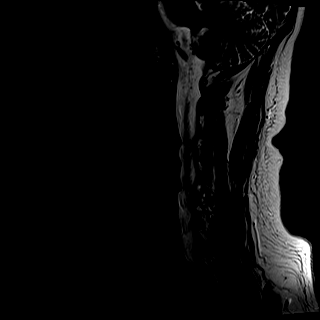
[im 9/15]
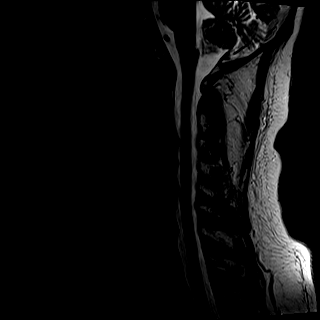
[im 12/15]
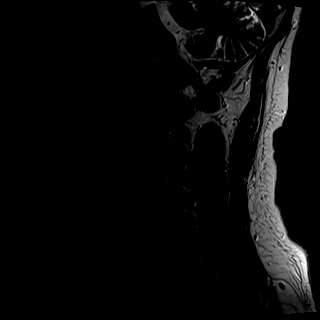
[im 15/15]
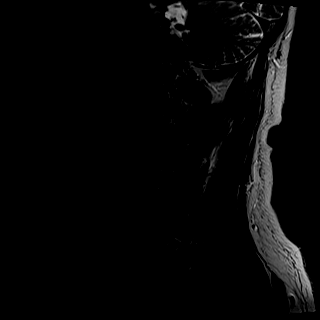

[Series 3: T1 · sagittal · 3.0mm · 0.41mm/px · 6 of 15 slices shown (1 of 2)]
[im 1/15]
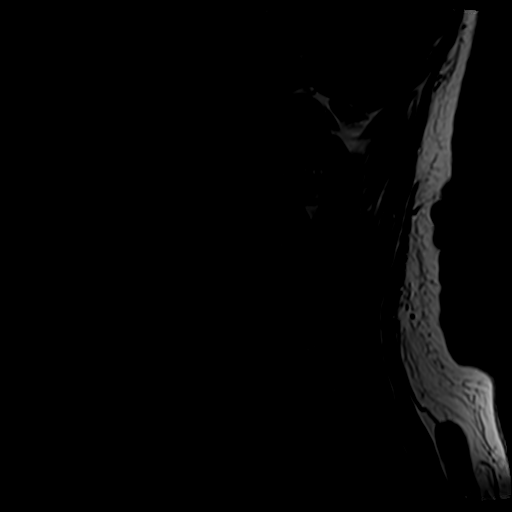
[im 3/15]
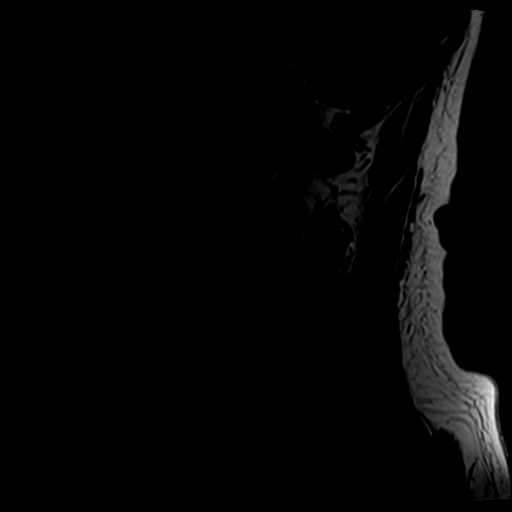
[im 6/15]
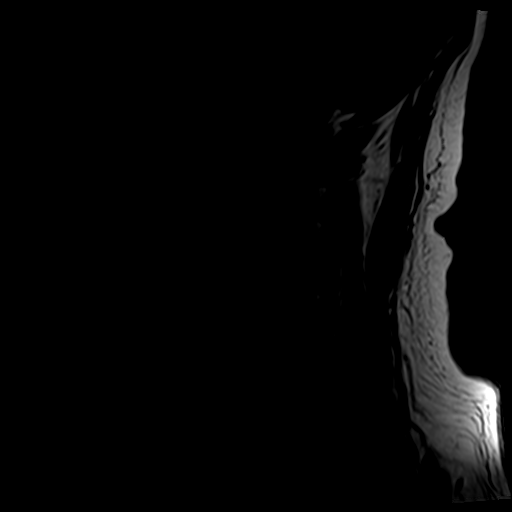
[im 9/15]
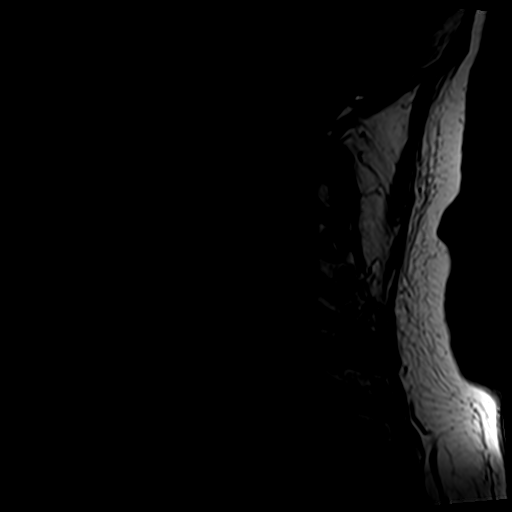
[im 12/15]
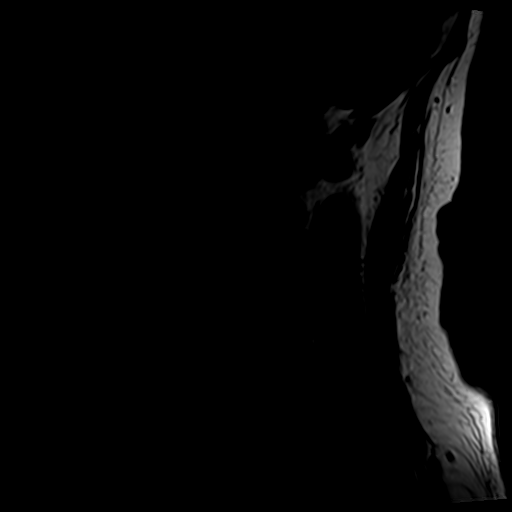
[im 15/15]
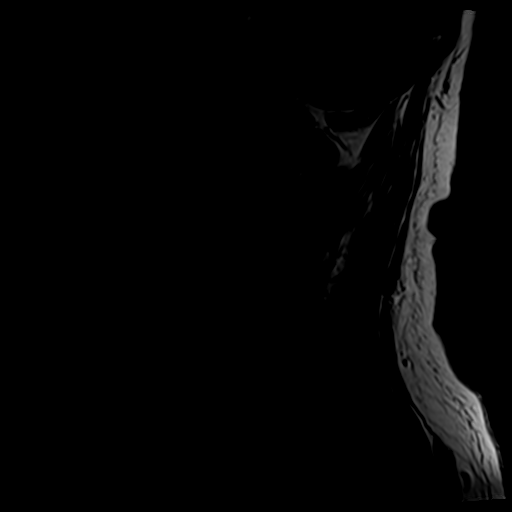

[Series 4: tir sag · sagittal · 3.0mm · 0.41mm/px · 1 of 15 slices shown]
[im 1/15]
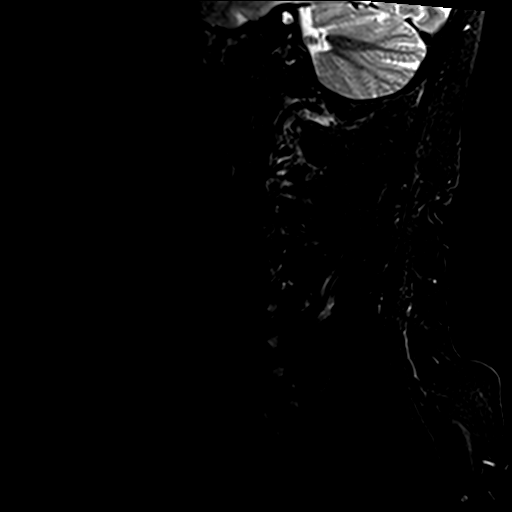

[Series 6: T2 · axial · 3.0mm · 0.70mm/px · z∈[-79,+29]mm · 9 of 30 slices shown (2 of 2)]
[im 1/30]
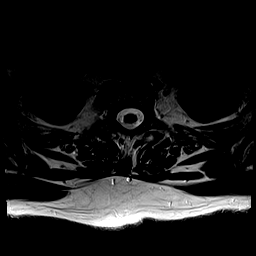
[im 6/30]
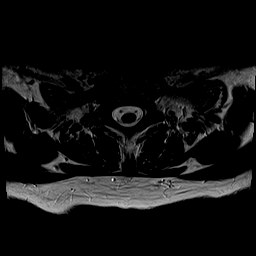
[im 8/30]
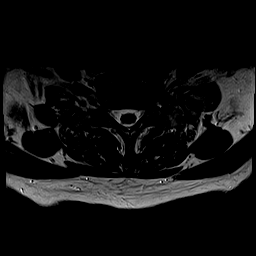
[im 14/30]
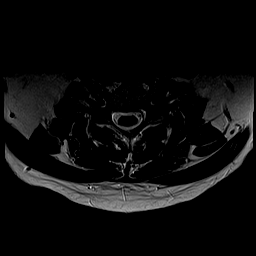
[im 16/30]
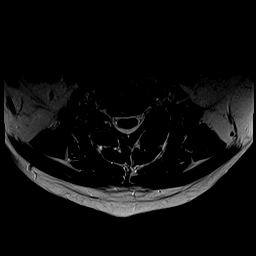
[im 22/30]
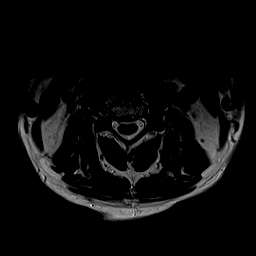
[im 24/30]
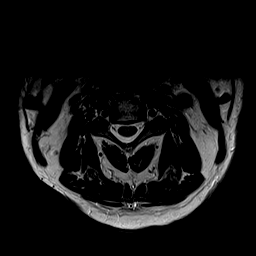
[im 27/30]
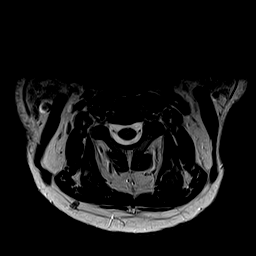
[im 30/30]
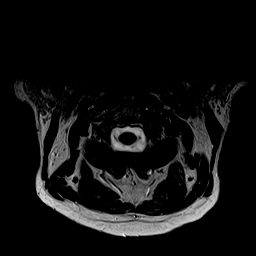

[Series 7: T1 · sagittal · 3.0mm · 0.41mm/px · 6 of 15 slices shown (2 of 2)]
[im 1/15]
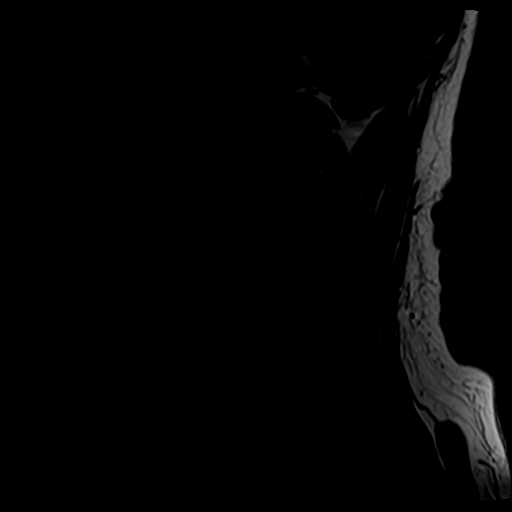
[im 3/15]
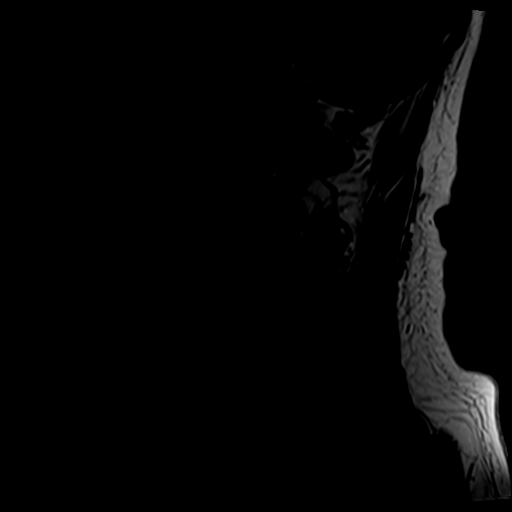
[im 6/15]
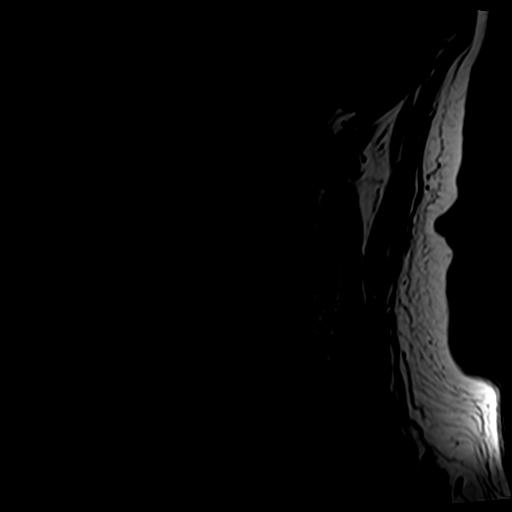
[im 9/15]
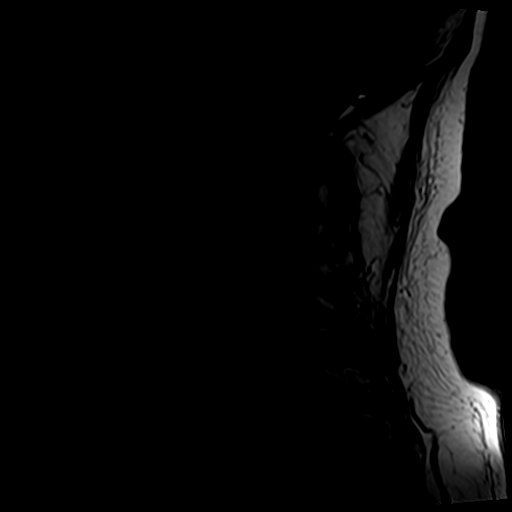
[im 12/15]
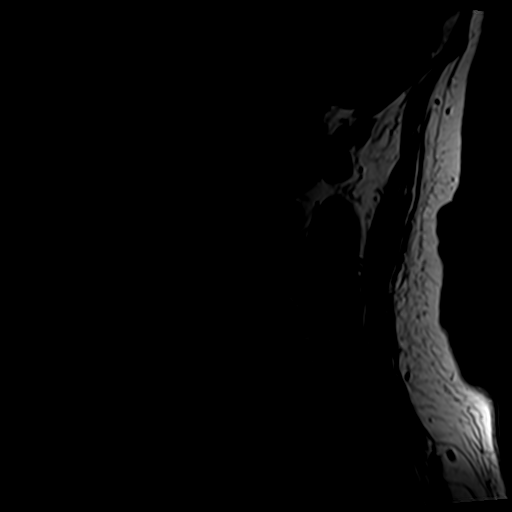
[im 15/15]
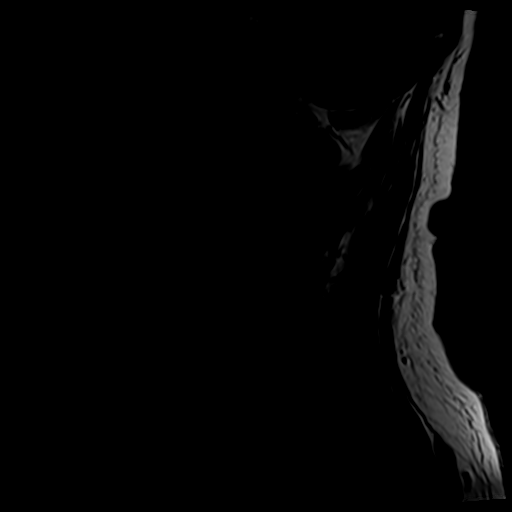

[28 of 48 positions shown; findings below may reference images not displayed]

FINDINGS: Alignment: Mild reversal of the cervical curvature. Small
retrolisthesis of C3 over C4, C5 over C6, C6 over C7-C7 over T1.

Vertebrae: No fracture, evidence of discitis, or bone lesion.
Endplate degenerative changes throughout the cervical spine.

Cord: Normal signal and morphology.

Posterior Fossa, vertebral arteries, paraspinal tissues: Negative.

Disc levels:

C2-3: Mild facet degenerative changes. No spinal canal or neural
foraminal stenosis.

C3-4: Small posterior disc osteophyte complex without significant
spinal canal stenosis. Uncovertebral and facet degenerative changes
resulting in moderate right and mild left neural foraminal
narrowing.

C4-5: Small posterior disc osteophyte complex without significant
spinal canal stenosis. Uncovertebral and facet degenerative changes
resulting in moderate left neural foraminal narrowing.

C5-6: Small posterior disc osteophyte complex without significant
spinal canal stenosis. Uncovertebral and facet degenerative changes
resulting in moderate right and mild left neural foraminal
narrowing.

C6-7: Small posterior disc osteophyte complex without significant
spinal canal stenosis. Uncovertebral and facet degenerative changes
resulting in moderate bilateral neural foraminal narrowing.

C7-T1: Posterior disc osteophyte complex without significant spinal
canal stenosis. Uncovertebral and facet degenerative changes
resulting in moderate bilateral neural foraminal narrowing.

T1-2: No spinal canal or neural foraminal stenosis.
IMPRESSION: 1. Multilevel moderate neural foraminal narrowing, as described
above.
2. No high-grade spinal canal stenosis at any level.

## 2023-01-06 IMAGING — CT CT SHOULDER*L* W/O CM
3 of 4 series · 14 of 35 positions shown, 17 images · non-contrast
Comparison: X-ray 05/12/2021, MRI 06/02/2021

CLINICAL DATA: Chronic left shoulder pain

EXAM:
CT OF THE UPPER LEFT EXTREMITY WITHOUT CONTRAST
TECHNIQUE: Multidetector CT imaging of the upper left extremity was performed
according to the standard protocol.
RADIATION DOSE REDUCTION: This exam was performed according to the
departmental dose-optimization program which includes automated
exposure control, adjustment of the mA and/or kV according to
patient size and/or use of iterative reconstruction technique.

[Series 6: shoulder 2.00 br40 s3 cor soft · coronal · 0.45mm/px · 3 of 131 slices shown]
[im 37/131  bone]
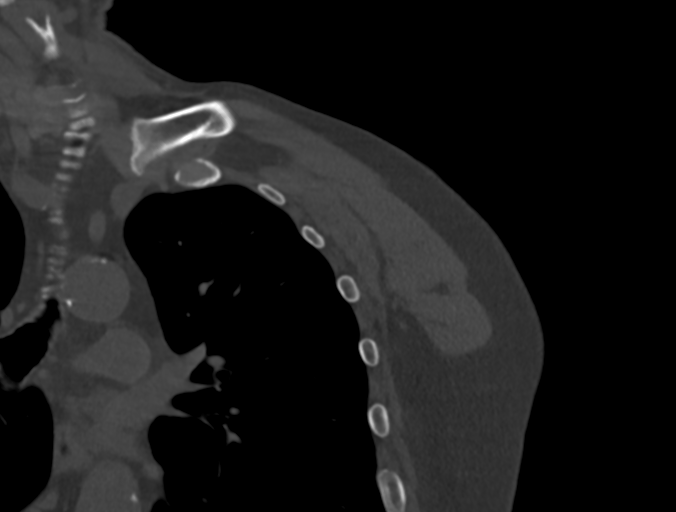
[im 56/131  bone]
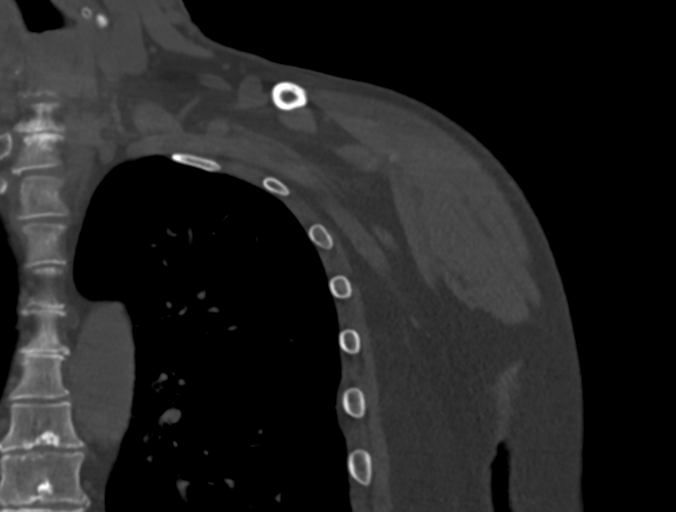
[im 75/131  bone]
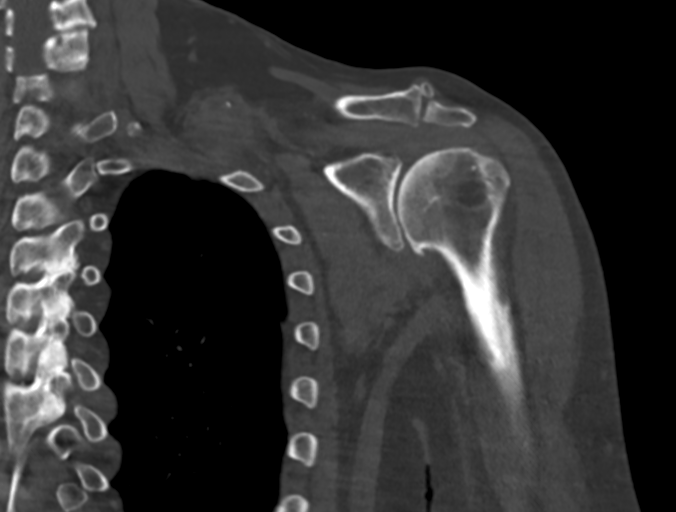

[Series 10: shoulder 2.00 br40 s3 sag soft · sagittal · 0.45mm/px · 5 of 151 slices shown, 6 images]
[im 51/151  bone]
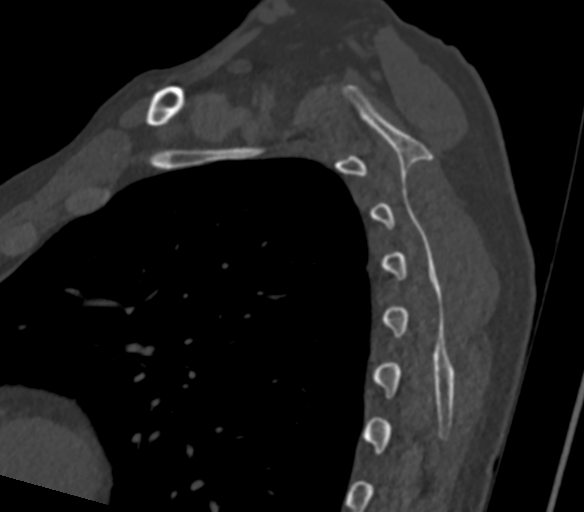
[im 63/151  bone]
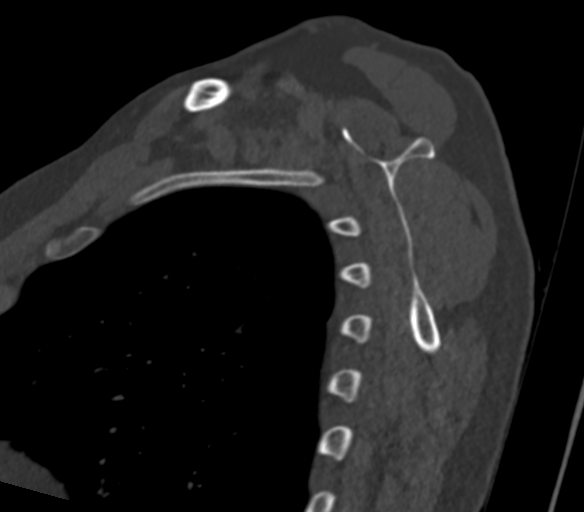
[im 76/151  soft-tissue]
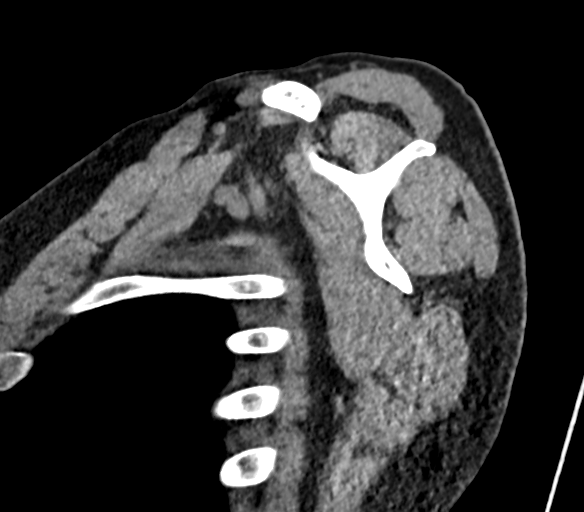
[im 76/151  bone]
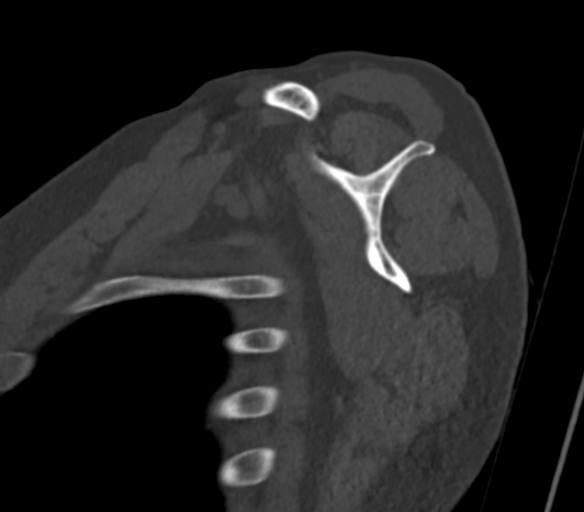
[im 88/151  bone]
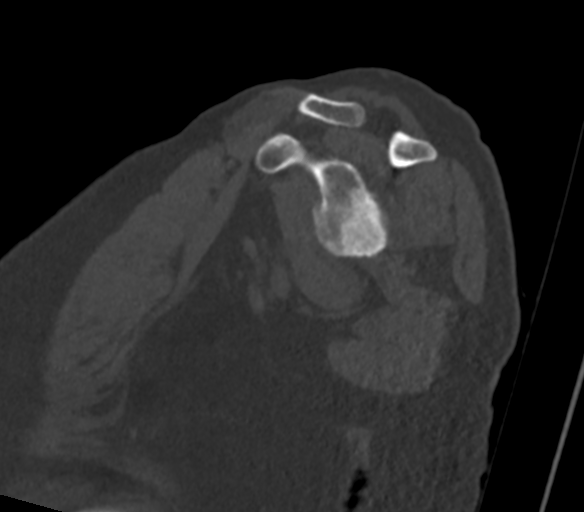
[im 101/151  bone]
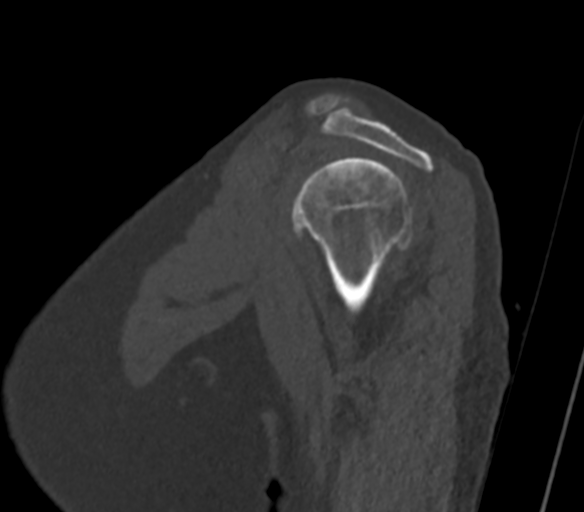

[Series 14: shoulder 0.60 br40 s3 thins soft · axial · 0.66mm/px · z∈[-943,-742]mm · 6 of 419 slices shown, 8 images]
[im 42/419  soft-tissue]
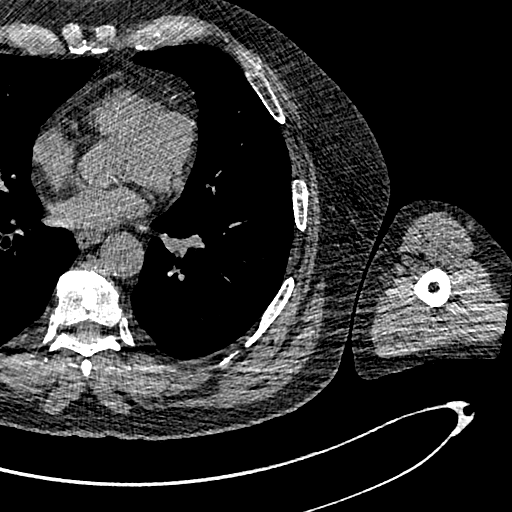
[im 42/419  bone]
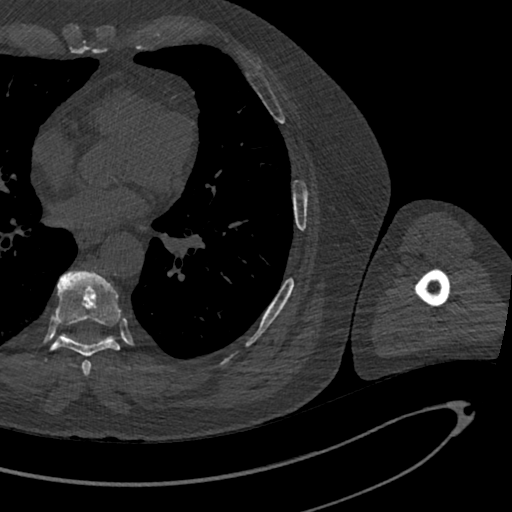
[im 126/419  bone]
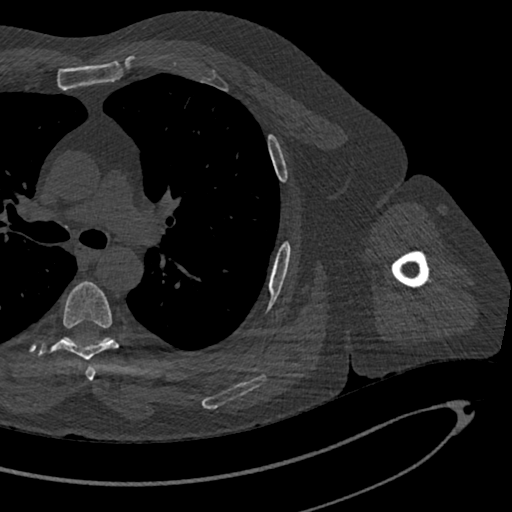
[im 168/419  bone]
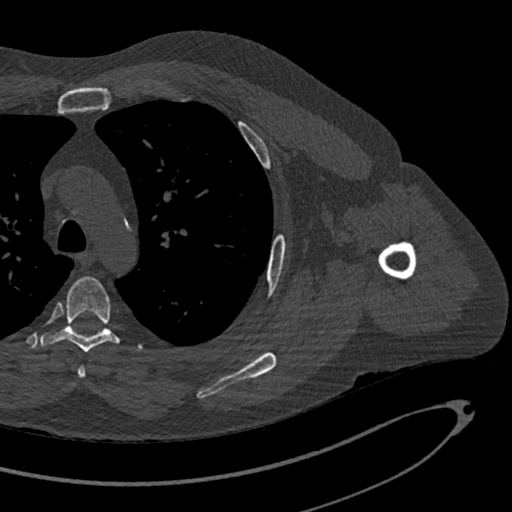
[im 251/419  bone]
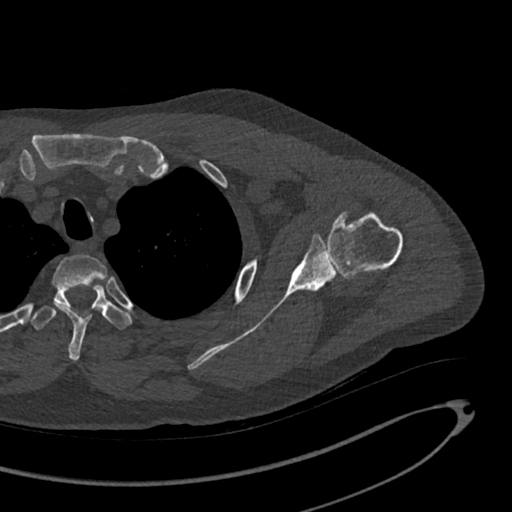
[im 293/419  soft-tissue]
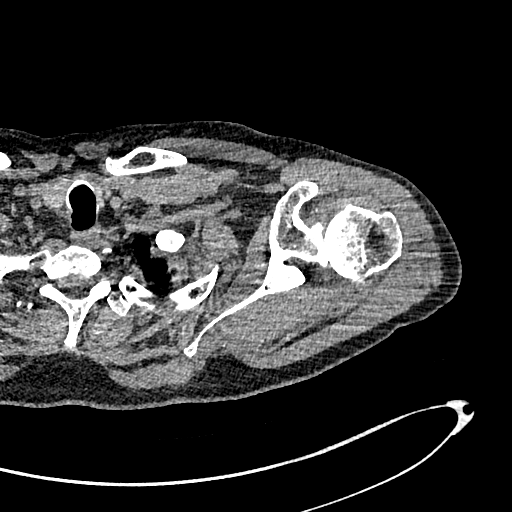
[im 293/419  bone]
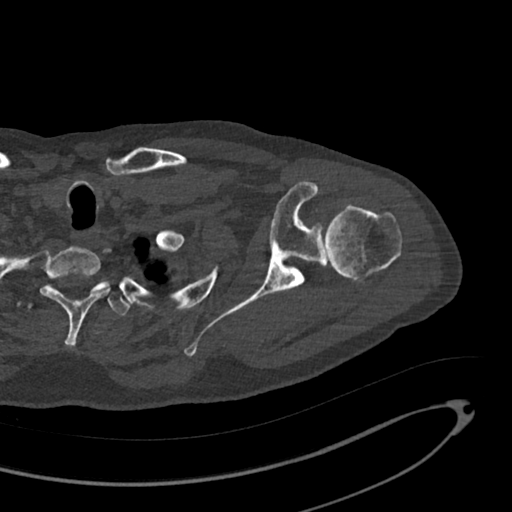
[im 377/419  bone]
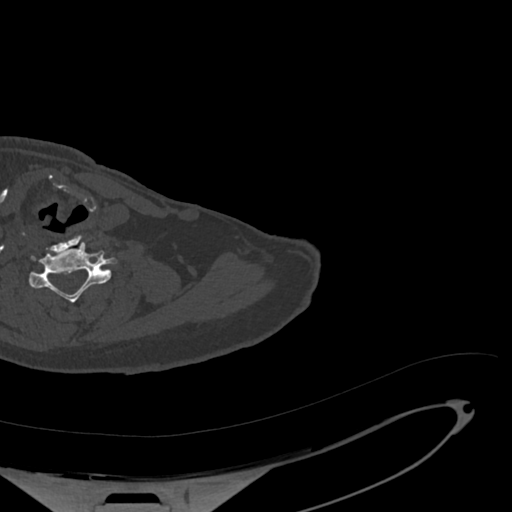

[14 of 35 positions shown; findings below may reference images not displayed]

FINDINGS: Bones/Joint/Cartilage

No acute fracture. No dislocation. Severe glenohumeral joint
osteoarthritis manifested by complete joint space loss, subchondral
sclerosis, and marginal osteophyte formation. Mild glenoid
retroversion. No large glenohumeral joint effusion is evident.

Moderate degenerative changes of the acromioclavicular joint. No
large subacromial-subdeltoid bursal fluid collection. Degenerative
disc disease of the visualized cervical spine levels. Remaining
visualized osseous structures appear grossly intact.

Ligaments

Suboptimally assessed by CT.

Muscles and Tendons

Fatty atrophy of the teres minor muscle. Preserved muscle bulk of
the remaining rotator cuff musculature without significant atrophy
or fatty infiltration. Rotator cuff tendons better evaluated on
recent shoulder MRI.

Soft tissues

No soft tissue fluid collection or hematoma. No axillary
lymphadenopathy. Visualized lung field is clear. Aortic and coronary
artery atherosclerosis.
IMPRESSION: 1. Severe left glenohumeral joint osteoarthritis.
2. Moderate degenerative changes of the acromioclavicular joint.
3. Fatty atrophy of the teres minor muscle.
4. Aortic and coronary artery atherosclerosis ([id number]).

## 2023-02-11 DIAGNOSIS — M5416 Radiculopathy, lumbar region: Secondary | ICD-10-CM | POA: Diagnosis not present

## 2023-03-01 DIAGNOSIS — K080 Exfoliation of teeth due to systemic causes: Secondary | ICD-10-CM | POA: Diagnosis not present

## 2023-03-08 ENCOUNTER — Telehealth: Payer: Self-pay | Admitting: Internal Medicine

## 2023-03-08 NOTE — Telephone Encounter (Signed)
 Patient is not currently prescribed anything from out office. Please advise.

## 2023-03-08 NOTE — Telephone Encounter (Signed)
 Pt would like a call back at [phone]. Pt would like to know if there was a medication that does the same as prednisone. Pt does not want to take prednisone but is having a flair up and would like to know what to do.

## 2023-03-09 ENCOUNTER — Telehealth: Payer: Self-pay | Admitting: Internal Medicine

## 2023-03-09 NOTE — Telephone Encounter (Signed)
 Patient left a voicemail stating he left a message for Dr. Dimple Casey yesterday, 03/08/23 and hasn't received a return call.

## 2023-03-10 ENCOUNTER — Telehealth: Payer: Self-pay | Admitting: Internal Medicine

## 2023-03-10 DIAGNOSIS — J209 Acute bronchitis, unspecified: Secondary | ICD-10-CM | POA: Diagnosis not present

## 2023-03-10 DIAGNOSIS — R053 Chronic cough: Secondary | ICD-10-CM | POA: Diagnosis not present
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
## 2023-03-10 NOTE — Telephone Encounter (Signed)
 Pt is very upset he still has not heard anything from Dr. Dimple Casey. Pt would like a call immediately. Pt is having a chest x-ray today 03/10/23 and may be put on a steroid.

## 2023-03-23 DIAGNOSIS — G9529 Other cord compression: Secondary | ICD-10-CM | POA: Diagnosis not present

## 2023-03-23 DIAGNOSIS — G8929 Other chronic pain: Secondary | ICD-10-CM | POA: Diagnosis not present

## 2023-03-23 DIAGNOSIS — F320 Major depressive disorder, single episode, mild: Secondary | ICD-10-CM | POA: Diagnosis not present

## 2023-03-23 DIAGNOSIS — H2513 Age-related nuclear cataract, bilateral: Secondary | ICD-10-CM | POA: Diagnosis not present

## 2023-03-23 DIAGNOSIS — I10 Essential (primary) hypertension: Secondary | ICD-10-CM | POA: Diagnosis not present

## 2023-03-29 ENCOUNTER — Encounter: Payer: Self-pay | Admitting: *Deleted

## 2023-03-29 ENCOUNTER — Telehealth: Payer: Self-pay | Admitting: Diagnostic Neuroimaging

## 2023-03-29 NOTE — Telephone Encounter (Signed)
Appointment r/s

## 2023-03-30 ENCOUNTER — Ambulatory Visit: Payer: Medicare Other | Admitting: Diagnostic Neuroimaging

## 2023-05-13 DIAGNOSIS — M5416 Radiculopathy, lumbar region: Secondary | ICD-10-CM | POA: Diagnosis not present

## 2023-05-25 DIAGNOSIS — M5416 Radiculopathy, lumbar region: Secondary | ICD-10-CM | POA: Diagnosis not present

## 2023-06-09 ENCOUNTER — Other Ambulatory Visit: Payer: Self-pay | Admitting: Internal Medicine

## 2023-06-09 ENCOUNTER — Telehealth: Payer: Self-pay | Admitting: *Deleted

## 2023-06-09 DIAGNOSIS — M353 Polymyalgia rheumatica: Secondary | ICD-10-CM

## 2023-06-09 MED ORDER — PREDNISONE 5 MG PO TABS: 5.0000 mg | ORAL_TABLET | Freq: Every day | ORAL | 0 refills | Status: DC

## 2023-06-09 NOTE — Telephone Encounter (Signed)
 Sending Rx for 5 mg prednisone for next 1 wk until we can follow up

## 2023-06-09 NOTE — Telephone Encounter (Signed)
 Patient contacted the office stating that he is out of prednisone and he was advised by the pharmacy that his prescription was denied. Patient states that his PMR is really acting up. Patient requested a sooner appointment than August. Patient's appointment has been moved up to 06/16/2023 at 3:20 pm. Patient would like to know if he can have prednisone until he comes in for his appointment. Please advise.

## 2023-06-09 NOTE — Addendum Note (Signed)
 Addended by: Fuller Plan on: 06/09/2023 04:31 PM   Modules accepted: Orders

## 2023-06-09 NOTE — Telephone Encounter (Signed)
 Left message to advise patient  Rx for 5 mg prednisone for next 1 wk until we can follow up has been sent to the pharmacy.

## 2023-06-15 DIAGNOSIS — E782 Mixed hyperlipidemia: Secondary | ICD-10-CM | POA: Diagnosis not present

## 2023-06-15 DIAGNOSIS — I10 Essential (primary) hypertension: Secondary | ICD-10-CM | POA: Diagnosis not present

## 2023-06-15 DIAGNOSIS — M353 Polymyalgia rheumatica: Secondary | ICD-10-CM | POA: Diagnosis not present

## 2023-06-15 DIAGNOSIS — Z0000 Encounter for general adult medical examination without abnormal findings: Secondary | ICD-10-CM | POA: Diagnosis not present

## 2023-06-15 DIAGNOSIS — Z125 Encounter for screening for malignant neoplasm of prostate: Secondary | ICD-10-CM | POA: Diagnosis not present

## 2023-06-15 DIAGNOSIS — J3089 Other allergic rhinitis: Secondary | ICD-10-CM | POA: Diagnosis not present

## 2023-06-16 ENCOUNTER — Encounter: Payer: Self-pay | Admitting: Internal Medicine

## 2023-06-16 ENCOUNTER — Ambulatory Visit: Attending: Internal Medicine | Admitting: Internal Medicine

## 2023-06-16 VITALS — BP 120/89 | HR 106 | Resp 14 | Ht 66.0 in | Wt 167.0 lb

## 2023-06-16 DIAGNOSIS — R768 Other specified abnormal immunological findings in serum: Secondary | ICD-10-CM | POA: Diagnosis not present

## 2023-06-16 DIAGNOSIS — M353 Polymyalgia rheumatica: Secondary | ICD-10-CM
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
## 2023-06-17 ENCOUNTER — Telehealth: Payer: Self-pay | Admitting: *Deleted

## 2023-06-17 LAB — RHEUMATOID FACTOR: Rheumatoid fact SerPl-aCnc: 12 [IU]/mL (ref <14)

## 2023-06-17 LAB — SEDIMENTATION RATE: Sed Rate: 25 mm/h — ABNORMAL HIGH (ref 0–20)

## 2023-06-17 MED ORDER — PREDNISONE 5 MG PO TABS: 5.0000 mg | ORAL_TABLET | Freq: Every day | ORAL | 0 refills | Status: DC

## 2023-06-17 MED ORDER — PREDNISONE 5 MG PO TABS
ORAL_TABLET | ORAL | 0 refills | Status: AC
Start: 2023-06-17 — End: 2023-07-02

## 2023-06-17 NOTE — Progress Notes (Signed)
 Sed rate was increased to 25 up from 17. This is elevated similar to when his PMR was active over a year ago. The rheumatoid factor test is negative now though. I will send a prescription for doing 2 week taper of prednisone before trying to resume at 5 mg once daily for now.

## 2023-06-17 NOTE — Telephone Encounter (Signed)
 Patient contacted and left message stating he has not heard anything about his lab results and possible medication change.   Returned call to patient and advised I would forward the message to Dr. Rodell Citrin. Patient advised Dr. Rodell Citrin is out of the office this afternoon and will be out of the office tomorrow as well. Patient advised it may be Monday before we can get back to him. Patient states : I would hate to go 3 days without medication for my PMR. Jut see what you can do." I advised patient I would send message as discussed and would have to wait for a response from Dr. Rodell Citrin before I can respond.

## 2023-06-17 NOTE — Telephone Encounter (Signed)
 Addressed in associated result note now

## 2023-06-17 NOTE — Telephone Encounter (Signed)
See result note for details.

## 2023-06-30 ENCOUNTER — Ambulatory Visit: Admitting: Diagnostic Neuroimaging

## 2023-06-30 ENCOUNTER — Encounter: Payer: Self-pay | Admitting: Diagnostic Neuroimaging

## 2023-06-30 VITALS — BP 148/90 | HR 80 | Ht 66.0 in | Wt 169.4 lb

## 2023-06-30 DIAGNOSIS — R269 Unspecified abnormalities of gait and mobility: Secondary | ICD-10-CM | POA: Diagnosis not present

## 2023-06-30 NOTE — Patient Instructions (Addendum)
  INTERMITTENT GAIT IMBALANCE (h/o polio age 66 months old with right leg atrophy and weakness; h/o lumbar spinal stenosis s/p decompression L3-4; h/o polymyalgia rheumatica) - check MRI brain, B12, B1 - refer to PT evaluation - follow up with pain mgmt [facility] Neurosurgery)  LEFT EYE VISION LOSS (1-2 hours; ~2015) - med mgmt for TIA; aspirin  81mg  daily, statin, BP control
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
## 2023-07-01 ENCOUNTER — Telehealth: Payer: Self-pay | Admitting: Diagnostic Neuroimaging

## 2023-07-01 NOTE — Telephone Encounter (Signed)
 [employer] medicare [employer]: [id number] exp. 07/01/23-07/30/23 for GI

## 2023-07-03 LAB — VITAMIN B12: Vitamin B-12: 347 pg/mL (ref 232–1245)

## 2023-07-03 LAB — VITAMIN B1: Thiamine: 148.5 nmol/L (ref 66.5–200.0)

## 2023-07-05 ENCOUNTER — Encounter: Payer: Self-pay | Admitting: Diagnostic Neuroimaging

## 2023-07-12 ENCOUNTER — Ambulatory Visit: Payer: Medicare Other | Admitting: Diagnostic Neuroimaging

## 2023-07-14 ENCOUNTER — Telehealth: Payer: Self-pay | Admitting: Diagnostic Neuroimaging

## 2023-07-14 NOTE — Telephone Encounter (Signed)
 Pt called stating that he is claustrophobic and will be needing something called in for him. Pt's appt is on 5/16 and is needing it sent to the [facility] on [address] and [address]

## 2023-07-14 NOTE — Telephone Encounter (Signed)
 MRI Brain scheduled 07/16/23. Are you willing to prescribe something to help pt relax? If so Rx is pended for review and approval.

## 2023-07-15 MED ORDER — ALPRAZOLAM 0.5 MG PO TABS: ORAL_TABLET | ORAL | 0 refills | Status: DC

## 2023-07-15 NOTE — Telephone Encounter (Signed)
 Meds ordered this encounter  Medications   ALPRAZolam (XANAX) 0.5 MG tablet    Sig: for sedation before MRI scan; take 1 tab 1 hour before scan; may repeat 1 tab 15 min before scan    Dispense:  3 tablet    Refill:  0   Omega Bible, MD 07/15/2023, 10:04 AM Certified in Neurology, Neurophysiology and Neuroimaging  [facility] [address], [address] [place], [place] [address] [phone]

## 2023-07-16 ENCOUNTER — Ambulatory Visit
Admission: RE | Admit: 2023-07-16 | Discharge: 2023-07-16 | Disposition: A | Discharge status: Home / Self Care | Source: Ambulatory Visit | Attending: Diagnostic Neuroimaging | Admitting: Diagnostic Neuroimaging

## 2023-07-16 DIAGNOSIS — R269 Unspecified abnormalities of gait and mobility: Secondary | ICD-10-CM | POA: Diagnosis not present

## 2023-07-16 MED ORDER — GADOPICLENOL 0.5 MMOL/ML IV SOLN
8.0000 mL | Freq: Once | INTRAVENOUS | Status: AC | PRN
  Administered 2023-07-16: 8 mL via INTRAVENOUS

## 2023-07-19 ENCOUNTER — Ambulatory Visit: Payer: Self-pay | Admitting: Diagnostic Neuroimaging

## 2023-07-22 ENCOUNTER — Other Ambulatory Visit: Payer: Self-pay

## 2023-07-22 ENCOUNTER — Ambulatory Visit: Attending: Diagnostic Neuroimaging

## 2023-07-22 DIAGNOSIS — R269 Unspecified abnormalities of gait and mobility: Secondary | ICD-10-CM | POA: Insufficient documentation

## 2023-07-22 DIAGNOSIS — M546 Pain in thoracic spine: Secondary | ICD-10-CM

## 2023-07-22 DIAGNOSIS — M6281 Muscle weakness (generalized): Secondary | ICD-10-CM | POA: Diagnosis not present

## 2023-07-22 DIAGNOSIS — R2689 Other abnormalities of gait and mobility: Secondary | ICD-10-CM
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
## 2023-07-29 ENCOUNTER — Ambulatory Visit

## 2023-07-29 DIAGNOSIS — M6281 Muscle weakness (generalized): Secondary | ICD-10-CM | POA: Diagnosis not present

## 2023-07-29 DIAGNOSIS — R2689 Other abnormalities of gait and mobility: Secondary | ICD-10-CM | POA: Diagnosis not present

## 2023-07-29 DIAGNOSIS — R269 Unspecified abnormalities of gait and mobility: Secondary | ICD-10-CM | POA: Diagnosis not present

## 2023-07-29 DIAGNOSIS — M546 Pain in thoracic spine: Secondary | ICD-10-CM | POA: Diagnosis not present
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
## 2023-08-04 ENCOUNTER — Ambulatory Visit: Attending: Diagnostic Neuroimaging

## 2023-08-04 DIAGNOSIS — M6281 Muscle weakness (generalized): Secondary | ICD-10-CM | POA: Diagnosis not present

## 2023-08-04 DIAGNOSIS — R2689 Other abnormalities of gait and mobility: Secondary | ICD-10-CM

## 2023-08-04 DIAGNOSIS — M546 Pain in thoracic spine: Secondary | ICD-10-CM | POA: Diagnosis not present
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
## 2023-08-06 ENCOUNTER — Ambulatory Visit
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
## 2023-08-12 ENCOUNTER — Encounter: Payer: Self-pay | Admitting: Physical Therapy

## 2023-08-12 ENCOUNTER — Ambulatory Visit: Admitting: Physical Therapy

## 2023-08-12 DIAGNOSIS — M6281 Muscle weakness (generalized): Secondary | ICD-10-CM | POA: Diagnosis not present

## 2023-08-12 DIAGNOSIS — R2689 Other abnormalities of gait and mobility: Secondary | ICD-10-CM

## 2023-08-12 DIAGNOSIS — M546 Pain in thoracic spine: Secondary | ICD-10-CM | POA: Diagnosis not present

## 2023-08-12 DIAGNOSIS — I10 Essential (primary) hypertension: Secondary | ICD-10-CM | POA: Diagnosis not present

## 2023-08-18 ENCOUNTER — Ambulatory Visit

## 2023-08-18 DIAGNOSIS — R2689 Other abnormalities of gait and mobility: Secondary | ICD-10-CM

## 2023-08-18 DIAGNOSIS — M6281 Muscle weakness (generalized): Secondary | ICD-10-CM | POA: Diagnosis not present

## 2023-08-18 DIAGNOSIS — M546 Pain in thoracic spine: Secondary | ICD-10-CM | POA: Diagnosis not present
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
## 2023-08-20 ENCOUNTER — Ambulatory Visit

## 2023-08-20 DIAGNOSIS — M546 Pain in thoracic spine: Secondary | ICD-10-CM | POA: Diagnosis not present

## 2023-08-20 DIAGNOSIS — M6281 Muscle weakness (generalized): Secondary | ICD-10-CM

## 2023-08-20 DIAGNOSIS — R2689 Other abnormalities of gait and mobility: Secondary | ICD-10-CM
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
## 2023-08-25 ENCOUNTER — Encounter: Payer: Self-pay | Admitting: Physical Therapy

## 2023-08-25 ENCOUNTER — Ambulatory Visit: Admitting: Physical Therapy

## 2023-08-25 DIAGNOSIS — R2689 Other abnormalities of gait and mobility: Secondary | ICD-10-CM

## 2023-08-25 DIAGNOSIS — M546 Pain in thoracic spine: Secondary | ICD-10-CM | POA: Diagnosis not present

## 2023-08-25 DIAGNOSIS — M6281 Muscle weakness (generalized): Secondary | ICD-10-CM
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
## 2023-08-26 ENCOUNTER — Ambulatory Visit

## 2023-08-26 DIAGNOSIS — M6281 Muscle weakness (generalized): Secondary | ICD-10-CM

## 2023-08-26 DIAGNOSIS — M546 Pain in thoracic spine: Secondary | ICD-10-CM | POA: Diagnosis not present

## 2023-08-26 DIAGNOSIS — M5416 Radiculopathy, lumbar region: Secondary | ICD-10-CM | POA: Diagnosis not present

## 2023-08-26 DIAGNOSIS — R2689 Other abnormalities of gait and mobility: Secondary | ICD-10-CM
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
## 2023-08-30 DIAGNOSIS — E7800 Pure hypercholesterolemia, unspecified: Secondary | ICD-10-CM | POA: Diagnosis not present

## 2023-08-30 DIAGNOSIS — F321 Major depressive disorder, single episode, moderate: Secondary | ICD-10-CM | POA: Diagnosis not present

## 2023-08-30 DIAGNOSIS — F320 Major depressive disorder, single episode, mild: Secondary | ICD-10-CM | POA: Diagnosis not present

## 2023-08-30 DIAGNOSIS — I10 Essential (primary) hypertension: Secondary | ICD-10-CM | POA: Diagnosis not present
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
## 2023-08-31 ENCOUNTER — Encounter: Payer: Self-pay | Admitting: Physical Therapy

## 2023-08-31 ENCOUNTER — Ambulatory Visit: Attending: Diagnostic Neuroimaging | Admitting: Physical Therapy

## 2023-08-31 DIAGNOSIS — M546 Pain in thoracic spine: Secondary | ICD-10-CM | POA: Diagnosis not present

## 2023-08-31 DIAGNOSIS — M25512 Pain in left shoulder: Secondary | ICD-10-CM | POA: Diagnosis not present

## 2023-08-31 DIAGNOSIS — M6281 Muscle weakness (generalized): Secondary | ICD-10-CM | POA: Diagnosis not present

## 2023-08-31 DIAGNOSIS — G8929 Other chronic pain: Secondary | ICD-10-CM | POA: Diagnosis not present

## 2023-08-31 DIAGNOSIS — M25612 Stiffness of left shoulder, not elsewhere classified: Secondary | ICD-10-CM | POA: Diagnosis not present

## 2023-08-31 DIAGNOSIS — R2689 Other abnormalities of gait and mobility: Secondary | ICD-10-CM | POA: Diagnosis not present
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
## 2023-09-02 ENCOUNTER — Ambulatory Visit: Admitting: Physical Therapy

## 2023-09-02 ENCOUNTER — Encounter: Payer: Self-pay | Admitting: Physical Therapy

## 2023-09-02 DIAGNOSIS — G8929 Other chronic pain: Secondary | ICD-10-CM | POA: Diagnosis not present

## 2023-09-02 DIAGNOSIS — M25612 Stiffness of left shoulder, not elsewhere classified: Secondary | ICD-10-CM | POA: Diagnosis not present

## 2023-09-02 DIAGNOSIS — M6281 Muscle weakness (generalized): Secondary | ICD-10-CM | POA: Diagnosis not present

## 2023-09-02 DIAGNOSIS — R2689 Other abnormalities of gait and mobility: Secondary | ICD-10-CM | POA: Diagnosis not present

## 2023-09-02 DIAGNOSIS — M25512 Pain in left shoulder: Secondary | ICD-10-CM | POA: Diagnosis not present

## 2023-09-02 DIAGNOSIS — M546 Pain in thoracic spine: Secondary | ICD-10-CM | POA: Diagnosis not present

## 2023-09-03 ENCOUNTER — Other Ambulatory Visit: Payer: Self-pay

## 2023-09-03 ENCOUNTER — Encounter (HOSPITAL_BASED_OUTPATIENT_CLINIC_OR_DEPARTMENT_OTHER): Payer: Self-pay

## 2023-09-03 ENCOUNTER — Emergency Department (HOSPITAL_BASED_OUTPATIENT_CLINIC_OR_DEPARTMENT_OTHER)

## 2023-09-03 ENCOUNTER — Emergency Department (HOSPITAL_BASED_OUTPATIENT_CLINIC_OR_DEPARTMENT_OTHER)
Admission: EM | Admit: 2023-09-03 | Discharge: 2023-09-03 | Disposition: A | Discharge status: Home / Self Care | Attending: Emergency Medicine | Admitting: Emergency Medicine

## 2023-09-03 DIAGNOSIS — R109 Unspecified abdominal pain: Secondary | ICD-10-CM | POA: Diagnosis not present

## 2023-09-03 DIAGNOSIS — R1084 Generalized abdominal pain: Secondary | ICD-10-CM | POA: Diagnosis not present

## 2023-09-03 DIAGNOSIS — R1013 Epigastric pain: Secondary | ICD-10-CM

## 2023-09-03 DIAGNOSIS — K5730 Diverticulosis of large intestine without perforation or abscess without bleeding: Secondary | ICD-10-CM | POA: Diagnosis not present

## 2023-09-03 DIAGNOSIS — I10 Essential (primary) hypertension: Secondary | ICD-10-CM | POA: Diagnosis not present

## 2023-09-03 DIAGNOSIS — Z79899 Other long term (current) drug therapy: Secondary | ICD-10-CM | POA: Insufficient documentation

## 2023-09-03 DIAGNOSIS — I7143 Infrarenal abdominal aortic aneurysm, without rupture: Secondary | ICD-10-CM | POA: Diagnosis not present

## 2023-09-03 DIAGNOSIS — K859 Acute pancreatitis without necrosis or infection, unspecified: Secondary | ICD-10-CM | POA: Insufficient documentation

## 2023-09-03 DIAGNOSIS — R188 Other ascites: Secondary | ICD-10-CM | POA: Diagnosis not present

## 2023-09-03 LAB — CBC
HCT: 42.7 % (ref 39.0–52.0)
Hemoglobin: 15 g/dL (ref 13.0–17.0)
MCH: 34 pg (ref 26.0–34.0)
MCHC: 35.1 g/dL (ref 30.0–36.0)
MCV: 96.8 fL (ref 80.0–100.0)
Platelets: 377 K/uL (ref 150–400)
RBC: 4.41 MIL/uL (ref 4.22–5.81)
RDW: 13 % (ref 11.5–15.5)
WBC: 19.7 K/uL — ABNORMAL HIGH (ref 4.0–10.5)
nRBC: 0 % (ref 0.0–0.2)

## 2023-09-03 LAB — URINALYSIS, ROUTINE W REFLEX MICROSCOPIC
Bilirubin Urine: NEGATIVE
Glucose, UA: NEGATIVE mg/dL
Hgb urine dipstick: NEGATIVE
Ketones, ur: 15 mg/dL — AB
Leukocytes,Ua: NEGATIVE
Nitrite: NEGATIVE
Specific Gravity, Urine: >1.046 — ABNORMAL HIGH (ref 1.005–1.030)
pH: 6 (ref 5.0–8.0)

## 2023-09-03 LAB — COMPREHENSIVE METABOLIC PANEL WITH GFR
ALT: 26 U/L (ref 0–44)
AST: 27 U/L (ref 15–41)
Albumin: 4.2 g/dL (ref 3.5–5.0)
Alkaline Phosphatase: 91 U/L (ref 38–126)
Anion gap: 14 (ref 5–15)
BUN: 24 mg/dL — ABNORMAL HIGH (ref 8–23)
CO2: 21 mmol/L — ABNORMAL LOW (ref 22–32)
Calcium: 9.4 mg/dL (ref 8.9–10.3)
Chloride: 100 mmol/L (ref 98–111)
Creatinine, Ser: 0.76 mg/dL (ref 0.61–1.24)
GFR, Estimated: >60 mL/min (ref >60)
Glucose, Bld: 102 mg/dL — ABNORMAL HIGH (ref 70–99)
Potassium: 4 mmol/L (ref 3.5–5.1)
Sodium: 135 mmol/L (ref 135–145)
Total Bilirubin: 1.2 mg/dL (ref 0.0–1.2)
Total Protein: 7.1 g/dL (ref 6.5–8.1)

## 2023-09-03 LAB — LIPASE, BLOOD: Lipase: 379 U/L — ABNORMAL HIGH (ref 11–51)

## 2023-09-03 MED ORDER — HYDROMORPHONE HCL 1 MG/ML IJ SOLN
1.0000 mg | Freq: Once | INTRAMUSCULAR | Status: AC
  Administered 2023-09-03: 1 mg via INTRAVENOUS
  Filled 2023-09-03: qty 1

## 2023-09-03 MED ORDER — SODIUM CHLORIDE 0.9 % IV SOLN: INTRAVENOUS | Status: DC

## 2023-09-03 MED ORDER — IOHEXOL 300 MG/ML  SOLN
100.0000 mL | Freq: Once | INTRAMUSCULAR | Status: AC | PRN
  Administered 2023-09-03: 100 mL via INTRAVENOUS

## 2023-09-03 MED ORDER — SODIUM CHLORIDE 0.9 % IV BOLUS
1000.0000 mL | Freq: Once | INTRAVENOUS | Status: AC
  Administered 2023-09-03: 1000 mL via INTRAVENOUS

## 2023-09-03 MED ORDER — ONDANSETRON HCL 4 MG/2ML IJ SOLN
4.0000 mg | Freq: Once | INTRAMUSCULAR | Status: AC
  Administered 2023-09-03: 4 mg via INTRAVENOUS
  Filled 2023-09-03: qty 2

## 2023-09-03 MED ORDER — ONDANSETRON 4 MG PO TBDP: 4.0000 mg | ORAL_TABLET | Freq: Three times a day (TID) | ORAL | 0 refills | Status: AC | PRN

## 2023-09-03 NOTE — ED Triage Notes (Signed)
 Pt states he thinks he has pancreatitis again. States he woke generalized abdominal pain. Denies vomiting, reports spells of nausea. Denies any other s/s at time of triage.

## 2023-09-03 NOTE — ED Provider Notes (Addendum)
 [place] EMERGENCY DEPARTMENT AT [facility] Provider Note   CSN: [id number] Arrival date & time: 09/03/23  2038     Patient presents with: Abdominal Pain   Victor Ramirez is a 68 y.o. male.   Patient was feeling fine until about 730 this morning.  Suddenly got abdominal pain with nausea but no vomiting.  Reminds him of when he had pancreatitis about 2 3 years ago.  Denies any heavy alcohol intake but does normally drink 5 to 6 cans of beer per week.  No history of gallstones.  Past medical history significant for hemochromatosis.  Hypertension.  Past surgical history significant for tonsillectomy.       Prior to Admission medications   Medication Sig Start Date End Date Taking? Authorizing Provider  ALPRAZolam  (XANAX ) 0.5 MG tablet for sedation before MRI scan; take 1 tab 1 hour before scan; may repeat 1 tab 15 min before scan 07/15/23   Penumalli, Vikram R, MD  amoxicillin-clavulanate (AUGMENTIN) 875-125 MG tablet Take 1 tablet by mouth 2 (two) times daily. Patient not taking: Reported on 07/22/2023 06/15/23   [provider]  buPROPion  (WELLBUTRIN  XL) 300 MG 24 hr tablet Take 300 mg by mouth every morning. 05/13/23   [provider]  cetirizine (ZYRTEC) 10 MG tablet Take 10 mg by mouth daily.    [provider]  Cholecalciferol (VITAMIN D -3 PO) Take by mouth.    [provider]  EPINEPHrine  (EPIPEN  JR) 0.15 MG/0.3ML injection Inject 0.15 mg into the muscle as needed for anaphylaxis. 09/12/19   [provider]  fluticasone  (FLONASE ) 50 MCG/ACT nasal spray Place 2 sprays into both nostrils daily.    [provider]  gabapentin  (NEURONTIN ) 300 MG capsule Take 1 capsule (300 mg total) by mouth 3 (three) times daily. Patient taking differently: Take 600 mg by mouth 2 (two) times daily. 11/28/16   Costella, Vincent J, PA-C  HYDROcodone -acetaminophen  (NORCO) 7.5-325 MG tablet Take 1 tablet by mouth in the morning, at noon, and at  bedtime.    [provider]  lisinopril  (PRINIVIL ,ZESTRIL ) 40 MG tablet Take 40 mg by mouth daily. 08/15/16   [provider]  methocarbamol  (ROBAXIN ) 750 MG tablet Take 750 mg by mouth 2 (two) times daily. 03/16/18   [provider]  predniSONE  (DELTASONE ) 5 MG tablet Take 1 tablet (5 mg total) by mouth daily with breakfast. 07/23/23   Jeannetta Lonni ORN, MD  rosuvastatin  (CRESTOR ) 5 MG tablet Take 5 mg by mouth daily.    [provider]    Allergies: Bee venom and Bee pollen    Review of Systems  Constitutional:  Negative for chills and fever.  HENT:  Negative for ear pain and sore throat.   Eyes:  Negative for pain and visual disturbance.  Respiratory:  Negative for cough and shortness of breath.   Cardiovascular:  Negative for chest pain and palpitations.  Gastrointestinal:  Positive for abdominal pain and nausea. Negative for vomiting.  Genitourinary:  Negative for dysuria and hematuria.  Musculoskeletal:  Negative for arthralgias and back pain.  Skin:  Negative for color change and rash.  Neurological:  Negative for seizures and syncope.  All other systems reviewed and are negative.   Updated Vital Signs BP 138/86   Pulse 91   Temp 97.9 F (36.6 C) (Temporal)   Resp 16   Ht 1.676 m (5' 6)   Wt 74.8 kg   SpO2 100%   BMI 26.63 kg/m   Physical Exam Vitals  and nursing note reviewed.  Constitutional:      General: He is not in acute distress.    Appearance: Normal appearance. He is well-developed.  HENT:     Head: Normocephalic and atraumatic.  Eyes:     Conjunctiva/sclera: Conjunctivae normal.     Pupils: Pupils are equal, round, and reactive to light.  Cardiovascular:     Rate and Rhythm: Normal rate and regular rhythm.     Heart sounds: No murmur heard. Pulmonary:     Effort: Pulmonary effort is normal. No respiratory distress.     Breath sounds: Normal breath sounds.  Abdominal:     Palpations: Abdomen is soft.      Tenderness: There is abdominal tenderness.     Comments: Tender to palpation epigastric area.  Musculoskeletal:        General: No swelling.     Cervical back: Neck supple.  Skin:    General: Skin is warm and dry.     Capillary Refill: Capillary refill takes less than 2 seconds.  Neurological:     General: No focal deficit present.     Mental Status: He is alert and oriented to person, place, and time.  Psychiatric:        Mood and Affect: Mood normal.     (all labs ordered are listed, but only abnormal results are displayed) Labs Reviewed  LIPASE, BLOOD - Abnormal; Notable for the following components:      Result Value   Lipase 379 (*)    All other components within normal limits  COMPREHENSIVE METABOLIC PANEL WITH GFR - Abnormal; Notable for the following components:   CO2 21 (*)    Glucose, Bld 102 (*)    BUN 24 (*)    All other components within normal limits  CBC - Abnormal; Notable for the following components:   WBC 19.7 (*)    All other components within normal limits  URINALYSIS, ROUTINE W REFLEX MICROSCOPIC - Abnormal; Notable for the following components:   Specific Gravity, Urine >1.046 (*)    Ketones, ur 15 (*)    Protein, ur TRACE (*)    All other components within normal limits    EKG: None  Radiology: CT ABDOMEN PELVIS W CONTRAST Result Date: 09/03/2023 CLINICAL DATA:  Generalized abdominal pain EXAM: CT ABDOMEN AND PELVIS WITH CONTRAST TECHNIQUE: Multidetector CT imaging of the abdomen and pelvis was performed using the standard protocol following bolus administration of intravenous contrast. RADIATION DOSE REDUCTION: This exam was performed according to the departmental dose-optimization program which includes automated exposure control, adjustment of the mA and/or kV according to patient size and/or use of iterative reconstruction technique. CONTRAST:  OMNIPAQUE  IOHEXOL  300 MG/ML  SOLN COMPARISON:  CT 12/02/2020 FINDINGS: Lower chest: Lung bases  demonstrate no acute airspace disease. Hepatobiliary: Hepatic steatosis. No calcified gallstone or biliary dilatation Pancreas: Small to moderate peripancreatic soft tissue stranding and trace fluid consistent with acute pancreatitis. No organized fluid collection Spleen: Normal in size without focal abnormality. Adrenals/Urinary Tract: Adrenal glands are normal. Kidneys show no hydronephrosis. The bladder is unremarkable Stomach/Bowel: The stomach is nonenlarged. Soft tissue stranding at the second and third portion of duodenum. Diverticular disease of the left colon without acute wall thickening Vascular/Lymphatic: Advanced aortic atherosclerosis. Infrarenal abdominal aortic aneurysm measuring up to 4.5 cm, previously 3.8 cm. Partial irregular thrombosis of aneurysm sac. No suspicious lymph nodes Reproductive: Prostate is unremarkable. Other: Negative for pelvic effusion or free air. Musculoskeletal: Chronic deformity of the sacrum. Detached  leads in the posterior subcutaneous soft tissues of the lumbosacral region. Posterior spinal fusion hardware L3-L4. In mild superior endplate deformity at L1 appears chronic but new compared to 2022 CT IMPRESSION: 1. Findings consistent with acute pancreatitis. No organized fluid collection. 2. Hepatic steatosis. 3. Diverticular disease of the left colon without acute wall thickening. 4. Infrarenal abdominal aortic aneurysm measuring up to 4.5 cm, previously 3.8 cm. Recommend follow-up CT or MR as appropriate in 12 months and referral to or continued care with vascular specialist. (Ref.: J Vasc Surg. 2018; 67:2-77 and J Am Coll Radiol 2013;10(10):789-794.) 5. Aortic atherosclerosis. Aortic Atherosclerosis (ICD10-I70.0). Electronically Signed   By: Luke Bun M.D.   On: 09/03/2023 22:50     Procedures   Medications Ordered in the ED  0.9 %  sodium chloride  infusion ( Intravenous New Bag/Given 09/03/23 2209)  HYDROmorphone  (DILAUDID ) injection 1 mg (has no  administration in time range)  ondansetron  (ZOFRAN ) injection 4 mg (has no administration in time range)  sodium chloride  0.9 % bolus 1,000 mL (1,000 mLs Intravenous New Bag/Given 09/03/23 2210)  HYDROmorphone  (DILAUDID ) injection 1 mg (1 mg Intravenous Given 09/03/23 2211)  ondansetron  (ZOFRAN ) injection 4 mg (4 mg Intravenous Given 09/03/23 2211)  iohexol  (OMNIPAQUE ) 300 MG/ML solution 100 mL (100 mLs Intravenous Contrast Given 09/03/23 2238)                                    Medical Decision Making Amount and/or Complexity of Data Reviewed Labs: ordered. Radiology: ordered.  Risk Prescription drug management.   Patient's lipase 379.  Complete metabolic panel total bili normal at 1.2 electrolytes normal renal function normal.  BUN little bit elevated 24 anion gap 14.  White blood cell count is 19.7 hemoglobin 15 platelets 377.  This is consistent with pancreatitis.  Will get CT scan abdomen pelvis to determine possible etiology or any complications.  Patient will receive IV fluids.  Patient will get pain medicine and antinausea medicine.  CT scan consistent with pancreatitis without any complicating factors.  Also incidental finding of an infrarenal abdominal aortic aneurysm measuring 4.  5 cm.  Previously was 3.8 cm.  Recommending follow-up CT or MRI in 12 months.  Patient feeling much better after pain meds.  In the face of normal labs other than elevated white blood cell count.  And no vomiting so far.  Will discuss whether patient feels that he can be treated at home.  Patient would like to be discharged home.  He is on chronic pain medicine.  He will be of take that.  Is requesting some Zofran .  Will put in no disease prescription for him.  He will follow-up with [facility] family medicine.  He will do a clear liquid diet for 48 hours and then advance to bland diet.  He will return for any new or worse symptoms to include persistent vomiting or fevers.  Or worse abdominal pain.  Final  diagnoses:  Epigastric pain  Acute pancreatitis without infection or necrosis, unspecified pancreatitis type    ED Discharge Orders     None          Geraldene Hamilton, MD 09/03/23 2210    Geraldene Hamilton, MD 09/03/23 2317

## 2023-09-03 NOTE — Discharge Instructions (Addendum)
 Clear liquid diet for the next 48 hours.  Then advance to bland diet.  Make an appointment follow-up with your primary care doctor.  Take the Zofran  ODT as needed for any nausea.  Take your pain medication that you have at home.  Return for fevers persistent vomiting worse abdominal pain.  Hopefully you will slowly get better.  But following up with your primary care doctor is very important.  Avoid any alcohol for the next few days

## 2023-09-06 ENCOUNTER — Other Ambulatory Visit: Payer: Self-pay

## 2023-09-06 ENCOUNTER — Encounter (HOSPITAL_BASED_OUTPATIENT_CLINIC_OR_DEPARTMENT_OTHER): Payer: Self-pay | Admitting: Emergency Medicine

## 2023-09-06 ENCOUNTER — Emergency Department (HOSPITAL_BASED_OUTPATIENT_CLINIC_OR_DEPARTMENT_OTHER)

## 2023-09-06 ENCOUNTER — Inpatient Hospital Stay (HOSPITAL_BASED_OUTPATIENT_CLINIC_OR_DEPARTMENT_OTHER)
Admission: EM | Admit: 2023-09-06 | Discharge: 2023-09-09 | DRG: 439 | Disposition: A | Discharge status: Home / Self Care | Attending: Internal Medicine | Admitting: Internal Medicine

## 2023-09-06 ENCOUNTER — Observation Stay (HOSPITAL_COMMUNITY)

## 2023-09-06 DIAGNOSIS — M353 Polymyalgia rheumatica: Secondary | ICD-10-CM | POA: Diagnosis present

## 2023-09-06 DIAGNOSIS — K760 Fatty (change of) liver, not elsewhere classified: Secondary | ICD-10-CM | POA: Diagnosis not present

## 2023-09-06 DIAGNOSIS — K8689 Other specified diseases of pancreas: Secondary | ICD-10-CM | POA: Diagnosis not present

## 2023-09-06 DIAGNOSIS — G14 Postpolio syndrome: Secondary | ICD-10-CM | POA: Diagnosis not present

## 2023-09-06 DIAGNOSIS — K8500 Idiopathic acute pancreatitis without necrosis or infection: Principal | ICD-10-CM | POA: Diagnosis present

## 2023-09-06 DIAGNOSIS — G4730 Sleep apnea, unspecified: Secondary | ICD-10-CM | POA: Diagnosis present

## 2023-09-06 DIAGNOSIS — Z808 Family history of malignant neoplasm of other organs or systems: Secondary | ICD-10-CM

## 2023-09-06 DIAGNOSIS — F419 Anxiety disorder, unspecified: Secondary | ICD-10-CM | POA: Diagnosis present

## 2023-09-06 DIAGNOSIS — Z87891 Personal history of nicotine dependence: Secondary | ICD-10-CM

## 2023-09-06 DIAGNOSIS — E782 Mixed hyperlipidemia: Secondary | ICD-10-CM

## 2023-09-06 DIAGNOSIS — Z8673 Personal history of transient ischemic attack (TIA), and cerebral infarction without residual deficits: Secondary | ICD-10-CM | POA: Diagnosis not present

## 2023-09-06 DIAGNOSIS — I6523 Occlusion and stenosis of bilateral carotid arteries: Secondary | ICD-10-CM | POA: Diagnosis present

## 2023-09-06 DIAGNOSIS — K567 Ileus, unspecified: Secondary | ICD-10-CM | POA: Diagnosis not present

## 2023-09-06 DIAGNOSIS — E669 Obesity, unspecified: Secondary | ICD-10-CM | POA: Diagnosis present

## 2023-09-06 DIAGNOSIS — M545 Low back pain, unspecified: Secondary | ICD-10-CM

## 2023-09-06 DIAGNOSIS — K560 Paralytic ileus: Secondary | ICD-10-CM | POA: Diagnosis not present

## 2023-09-06 DIAGNOSIS — K859 Acute pancreatitis without necrosis or infection, unspecified: Secondary | ICD-10-CM | POA: Diagnosis not present

## 2023-09-06 DIAGNOSIS — F32A Depression, unspecified: Secondary | ICD-10-CM | POA: Diagnosis present

## 2023-09-06 DIAGNOSIS — G459 Transient cerebral ischemic attack, unspecified: Secondary | ICD-10-CM | POA: Diagnosis not present

## 2023-09-06 DIAGNOSIS — K5730 Diverticulosis of large intestine without perforation or abscess without bleeding: Secondary | ICD-10-CM | POA: Diagnosis present

## 2023-09-06 DIAGNOSIS — Z981 Arthrodesis status: Secondary | ICD-10-CM | POA: Diagnosis not present

## 2023-09-06 DIAGNOSIS — Z79899 Other long term (current) drug therapy: Secondary | ICD-10-CM

## 2023-09-06 DIAGNOSIS — Z8612 Personal history of poliomyelitis: Secondary | ICD-10-CM | POA: Diagnosis present

## 2023-09-06 DIAGNOSIS — Z96612 Presence of left artificial shoulder joint: Secondary | ICD-10-CM | POA: Diagnosis present

## 2023-09-06 DIAGNOSIS — I7409 Other arterial embolism and thrombosis of abdominal aorta: Secondary | ICD-10-CM | POA: Diagnosis present

## 2023-09-06 DIAGNOSIS — Z8042 Family history of malignant neoplasm of prostate: Secondary | ICD-10-CM | POA: Diagnosis not present

## 2023-09-06 DIAGNOSIS — I7143 Infrarenal abdominal aortic aneurysm, without rupture: Secondary | ICD-10-CM | POA: Diagnosis present

## 2023-09-06 DIAGNOSIS — G8929 Other chronic pain: Secondary | ICD-10-CM | POA: Diagnosis not present

## 2023-09-06 DIAGNOSIS — E785 Hyperlipidemia, unspecified: Secondary | ICD-10-CM | POA: Diagnosis not present

## 2023-09-06 DIAGNOSIS — I10 Essential (primary) hypertension: Secondary | ICD-10-CM | POA: Diagnosis present

## 2023-09-06 DIAGNOSIS — R109 Unspecified abdominal pain: Secondary | ICD-10-CM | POA: Diagnosis not present

## 2023-09-06 DIAGNOSIS — Z8261 Family history of arthritis: Secondary | ICD-10-CM

## 2023-09-06 DIAGNOSIS — Z91030 Bee allergy status: Secondary | ICD-10-CM

## 2023-09-06 LAB — CBC WITH DIFFERENTIAL/PLATELET
Abs Immature Granulocytes: 0.12 K/uL — ABNORMAL HIGH (ref 0.00–0.07)
Basophils Absolute: 0 K/uL (ref 0.0–0.1)
Basophils Relative: 0 %
Eosinophils Absolute: 0.1 K/uL (ref 0.0–0.5)
Eosinophils Relative: 0 %
HCT: 42.9 % (ref 39.0–52.0)
Hemoglobin: 15 g/dL (ref 13.0–17.0)
Immature Granulocytes: 1 %
Lymphocytes Relative: 3 %
Lymphs Abs: 0.7 K/uL (ref 0.7–4.0)
MCH: 34.1 pg — ABNORMAL HIGH (ref 26.0–34.0)
MCHC: 35 g/dL (ref 30.0–36.0)
MCV: 97.5 fL (ref 80.0–100.0)
Monocytes Absolute: 1.2 K/uL — ABNORMAL HIGH (ref 0.1–1.0)
Monocytes Relative: 5 %
Neutro Abs: 21.3 K/uL — ABNORMAL HIGH (ref 1.7–7.7)
Neutrophils Relative %: 91 %
Platelets: 306 K/uL (ref 150–400)
RBC: 4.4 MIL/uL (ref 4.22–5.81)
RDW: 12.8 % (ref 11.5–15.5)
WBC: 23.4 K/uL — ABNORMAL HIGH (ref 4.0–10.5)
nRBC: 0 % (ref 0.0–0.2)

## 2023-09-06 LAB — COMPREHENSIVE METABOLIC PANEL WITH GFR
ALT: 15 U/L (ref 0–44)
AST: 23 U/L (ref 15–41)
Albumin: 3.8 g/dL (ref 3.5–5.0)
Alkaline Phosphatase: 89 U/L (ref 38–126)
Anion gap: 12 (ref 5–15)
BUN: 10 mg/dL (ref 8–23)
CO2: 23 mmol/L (ref 22–32)
Calcium: 9.5 mg/dL (ref 8.9–10.3)
Chloride: 104 mmol/L (ref 98–111)
Creatinine, Ser: 0.62 mg/dL (ref 0.61–1.24)
GFR, Estimated: >60 mL/min (ref >60)
Glucose, Bld: 146 mg/dL — ABNORMAL HIGH (ref 70–99)
Potassium: 3.7 mmol/L (ref 3.5–5.1)
Sodium: 139 mmol/L (ref 135–145)
Total Bilirubin: 0.5 mg/dL (ref 0.0–1.2)
Total Protein: 6.7 g/dL (ref 6.5–8.1)

## 2023-09-06 LAB — URINALYSIS, ROUTINE W REFLEX MICROSCOPIC
Bacteria, UA: NONE SEEN
Bilirubin Urine: NEGATIVE
Glucose, UA: NEGATIVE mg/dL
Hgb urine dipstick: NEGATIVE
Ketones, ur: NEGATIVE mg/dL
Leukocytes,Ua: NEGATIVE
Nitrite: NEGATIVE
Protein, ur: 30 mg/dL — AB
Specific Gravity, Urine: >1.046 — ABNORMAL HIGH (ref 1.005–1.030)
pH: 8 (ref 5.0–8.0)

## 2023-09-06 LAB — LIPASE, BLOOD: Lipase: 142 U/L — ABNORMAL HIGH (ref 11–51)

## 2023-09-06 MED ORDER — IOHEXOL 300 MG/ML  SOLN
100.0000 mL | Freq: Once | INTRAMUSCULAR | Status: AC | PRN
  Administered 2023-09-06: 100 mL via INTRAVENOUS

## 2023-09-06 MED ORDER — ACETAMINOPHEN 325 MG PO TABS: 650.0000 mg | ORAL_TABLET | Freq: Four times a day (QID) | ORAL | Status: DC | PRN

## 2023-09-06 MED ORDER — HYDROMORPHONE HCL 1 MG/ML IJ SOLN
1.0000 mg | Freq: Once | INTRAMUSCULAR | Status: AC
  Administered 2023-09-06: 1 mg via INTRAVENOUS
  Filled 2023-09-06: qty 1

## 2023-09-06 MED ORDER — ROSUVASTATIN CALCIUM 5 MG PO TABS
5.0000 mg | ORAL_TABLET | Freq: Every day | ORAL | Status: DC
  Administered 2023-09-07 – 2023-09-08 (×2): 5 mg via ORAL
  Filled 2023-09-06 (×2): qty 1

## 2023-09-06 MED ORDER — SODIUM CHLORIDE 0.9 % IV SOLN: INTRAVENOUS | Status: DC

## 2023-09-06 MED ORDER — HEPARIN SODIUM (PORCINE) 5000 UNIT/ML IJ SOLN
5000.0000 [IU] | Freq: Three times a day (TID) | INTRAMUSCULAR | Status: DC
  Administered 2023-09-06 – 2023-09-09 (×8): 5000 [IU] via SUBCUTANEOUS
  Filled 2023-09-06 (×8): qty 1

## 2023-09-06 MED ORDER — PREDNISONE 10 MG PO TABS: 5.0000 mg | ORAL_TABLET | Freq: Every day | ORAL | Status: DC

## 2023-09-06 MED ORDER — HYDROCODONE-ACETAMINOPHEN 7.5-325 MG PO TABS
1.0000 | ORAL_TABLET | Freq: Three times a day (TID) | ORAL | Status: DC
  Administered 2023-09-06 – 2023-09-08 (×7): 1 via ORAL
  Filled 2023-09-06 (×7): qty 1

## 2023-09-06 MED ORDER — ACETAMINOPHEN 650 MG RE SUPP: 650.0000 mg | Freq: Four times a day (QID) | RECTAL | Status: DC | PRN

## 2023-09-06 MED ORDER — HYDROMORPHONE HCL 1 MG/ML IJ SOLN
0.5000 mg | INTRAMUSCULAR | Status: DC | PRN
  Administered 2023-09-06 – 2023-09-09 (×9): 1 mg via INTRAVENOUS
  Filled 2023-09-06 (×9): qty 1

## 2023-09-06 MED ORDER — ONDANSETRON HCL 4 MG/2ML IJ SOLN
4.0000 mg | Freq: Once | INTRAMUSCULAR | Status: AC
  Administered 2023-09-06: 4 mg via INTRAVENOUS
  Filled 2023-09-06: qty 2

## 2023-09-06 MED ORDER — LACTATED RINGERS IV BOLUS
1000.0000 mL | Freq: Once | INTRAVENOUS | Status: AC
  Administered 2023-09-06: 1000 mL via INTRAVENOUS

## 2023-09-06 MED ORDER — LISINOPRIL 20 MG PO TABS
40.0000 mg | ORAL_TABLET | Freq: Every day | ORAL | Status: DC
  Administered 2023-09-07 – 2023-09-08 (×2): 40 mg via ORAL
  Filled 2023-09-06 (×2): qty 2

## 2023-09-06 MED ORDER — MORPHINE SULFATE (PF) 4 MG/ML IV SOLN
4.0000 mg | Freq: Once | INTRAVENOUS | Status: AC
  Administered 2023-09-06: 4 mg via INTRAVENOUS
  Filled 2023-09-06: qty 1

## 2023-09-06 MED ORDER — GABAPENTIN 300 MG PO CAPS: 600.0000 mg | ORAL_CAPSULE | Freq: Two times a day (BID) | ORAL | Status: DC | PRN

## 2023-09-06 MED ORDER — SODIUM CHLORIDE 0.9% FLUSH
3.0000 mL | Freq: Two times a day (BID) | INTRAVENOUS | Status: DC
  Administered 2023-09-06 – 2023-09-08 (×3): 3 mL via INTRAVENOUS

## 2023-09-06 MED ORDER — BUPROPION HCL ER (XL) 150 MG PO TB24
300.0000 mg | ORAL_TABLET | Freq: Every morning | ORAL | Status: DC
  Administered 2023-09-06 – 2023-09-08 (×3): 300 mg via ORAL
  Filled 2023-09-06 (×3): qty 2

## 2023-09-06 NOTE — ED Triage Notes (Signed)
 C/o generalized abd pain x 3 days. States it is a pancreatitis flare up. Seen here on 7/4 for same.
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
## 2023-09-06 NOTE — ED Notes (Signed)
 Pt unable to provide a urine sample at this time.

## 2023-09-06 NOTE — ED Notes (Signed)
 Called [facility] to transport patient to [facility] rm# 11

## 2023-09-06 NOTE — Plan of Care (Signed)
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
## 2023-09-07 ENCOUNTER — Ambulatory Visit: Admitting: Physical Therapy

## 2023-09-07 DIAGNOSIS — G14 Postpolio syndrome: Secondary | ICD-10-CM | POA: Diagnosis present

## 2023-09-07 DIAGNOSIS — F419 Anxiety disorder, unspecified: Secondary | ICD-10-CM | POA: Diagnosis present

## 2023-09-07 DIAGNOSIS — E785 Hyperlipidemia, unspecified: Secondary | ICD-10-CM | POA: Diagnosis present

## 2023-09-07 DIAGNOSIS — I6523 Occlusion and stenosis of bilateral carotid arteries: Secondary | ICD-10-CM | POA: Diagnosis present

## 2023-09-07 DIAGNOSIS — Z808 Family history of malignant neoplasm of other organs or systems: Secondary | ICD-10-CM | POA: Diagnosis not present

## 2023-09-07 DIAGNOSIS — I7143 Infrarenal abdominal aortic aneurysm, without rupture: Secondary | ICD-10-CM | POA: Diagnosis present

## 2023-09-07 DIAGNOSIS — K859 Acute pancreatitis without necrosis or infection, unspecified: Secondary | ICD-10-CM | POA: Diagnosis present

## 2023-09-07 DIAGNOSIS — G4730 Sleep apnea, unspecified: Secondary | ICD-10-CM | POA: Diagnosis present

## 2023-09-07 DIAGNOSIS — Z8673 Personal history of transient ischemic attack (TIA), and cerebral infarction without residual deficits: Secondary | ICD-10-CM | POA: Diagnosis not present

## 2023-09-07 DIAGNOSIS — Z96612 Presence of left artificial shoulder joint: Secondary | ICD-10-CM | POA: Diagnosis present

## 2023-09-07 DIAGNOSIS — K8500 Idiopathic acute pancreatitis without necrosis or infection: Secondary | ICD-10-CM | POA: Diagnosis present

## 2023-09-07 DIAGNOSIS — F32A Depression, unspecified: Secondary | ICD-10-CM | POA: Diagnosis present

## 2023-09-07 DIAGNOSIS — G8929 Other chronic pain: Secondary | ICD-10-CM | POA: Diagnosis present

## 2023-09-07 DIAGNOSIS — K5730 Diverticulosis of large intestine without perforation or abscess without bleeding: Secondary | ICD-10-CM | POA: Diagnosis present

## 2023-09-07 DIAGNOSIS — I7409 Other arterial embolism and thrombosis of abdominal aorta: Secondary | ICD-10-CM | POA: Diagnosis present

## 2023-09-07 DIAGNOSIS — Z91030 Bee allergy status: Secondary | ICD-10-CM | POA: Diagnosis not present

## 2023-09-07 DIAGNOSIS — M353 Polymyalgia rheumatica: Secondary | ICD-10-CM | POA: Diagnosis present

## 2023-09-07 DIAGNOSIS — Z87891 Personal history of nicotine dependence: Secondary | ICD-10-CM | POA: Diagnosis not present

## 2023-09-07 DIAGNOSIS — E669 Obesity, unspecified: Secondary | ICD-10-CM | POA: Diagnosis present

## 2023-09-07 DIAGNOSIS — Z981 Arthrodesis status: Secondary | ICD-10-CM | POA: Diagnosis not present

## 2023-09-07 DIAGNOSIS — K567 Ileus, unspecified: Secondary | ICD-10-CM | POA: Diagnosis present

## 2023-09-07 DIAGNOSIS — I10 Essential (primary) hypertension: Secondary | ICD-10-CM | POA: Diagnosis present

## 2023-09-07 DIAGNOSIS — Z79899 Other long term (current) drug therapy: Secondary | ICD-10-CM | POA: Diagnosis not present

## 2023-09-07 DIAGNOSIS — K760 Fatty (change of) liver, not elsewhere classified: Secondary | ICD-10-CM | POA: Diagnosis present

## 2023-09-07 DIAGNOSIS — R109 Unspecified abdominal pain: Secondary | ICD-10-CM | POA: Diagnosis present

## 2023-09-07 DIAGNOSIS — Z8042 Family history of malignant neoplasm of prostate: Secondary | ICD-10-CM | POA: Diagnosis not present

## 2023-09-07 LAB — COMPREHENSIVE METABOLIC PANEL WITH GFR
ALT: 14 U/L (ref 0–44)
AST: 19 U/L (ref 15–41)
Albumin: 2.6 g/dL — ABNORMAL LOW (ref 3.5–5.0)
Alkaline Phosphatase: 52 U/L (ref 38–126)
Anion gap: 11 (ref 5–15)
BUN: 13 mg/dL (ref 8–23)
CO2: 24 mmol/L (ref 22–32)
Calcium: 8.1 mg/dL — ABNORMAL LOW (ref 8.9–10.3)
Chloride: 103 mmol/L (ref 98–111)
Creatinine, Ser: 0.62 mg/dL (ref 0.61–1.24)
GFR, Estimated: >60 mL/min (ref >60)
Glucose, Bld: 82 mg/dL (ref 70–99)
Potassium: 4 mmol/L (ref 3.5–5.1)
Sodium: 138 mmol/L (ref 135–145)
Total Bilirubin: 0.8 mg/dL (ref 0.0–1.2)
Total Protein: 5.4 g/dL — ABNORMAL LOW (ref 6.5–8.1)

## 2023-09-07 LAB — CBC
HCT: 40.6 % (ref 39.0–52.0)
Hemoglobin: 13.4 g/dL (ref 13.0–17.0)
MCH: 33.8 pg (ref 26.0–34.0)
MCHC: 33 g/dL (ref 30.0–36.0)
MCV: 102.5 fL — ABNORMAL HIGH (ref 80.0–100.0)
Platelets: 278 K/uL (ref 150–400)
RBC: 3.96 MIL/uL — ABNORMAL LOW (ref 4.22–5.81)
RDW: 12.9 % (ref 11.5–15.5)
WBC: 13.8 K/uL — ABNORMAL HIGH (ref 4.0–10.5)
nRBC: 0 % (ref 0.0–0.2)

## 2023-09-07 LAB — TRIGLYCERIDES: Triglycerides: 102 mg/dL (ref <150)

## 2023-09-07 LAB — HIV ANTIBODY (ROUTINE TESTING W REFLEX): HIV Screen 4th Generation wRfx: NONREACTIVE

## 2023-09-07 MED ORDER — SODIUM CHLORIDE 0.9 % IV SOLN: INTRAVENOUS | Status: DC

## 2023-09-07 MED ORDER — POLYETHYLENE GLYCOL 3350 17 G PO PACK
17.0000 g | PACK | Freq: Two times a day (BID) | ORAL | Status: DC
  Administered 2023-09-07 – 2023-09-08 (×3): 17 g via ORAL
  Filled 2023-09-07 (×3): qty 1

## 2023-09-07 NOTE — Progress Notes (Signed)
 PROGRESS NOTE Victor Ramirez  FMW:[id number] DOB: 12-02-1955 DOA: 09/06/2023 PCP: Alben Therisa MATSU, PA  Brief Narrative/Hospital Course: 68 yo m with medical history significant of hypertension, hyperlipidemia, TIA,carotid artery disease,depression,anxiety, sleep apnea, PMR, postpolio syndrome,chronic back pain presenting with abdominal pain. Initially seen on 7/4 and was seen in the ED- CT showed evidence of uncomplicated pancreatitis.patient returns after persistent pain with associated nausea vomiting x 2 In ZI:[place] stable. Labs leukocytosis to 23.4.  Lipase improving but still elevated at 142.  Urinalysis showed protein only. CT of the abdomen pelvis>>ileus versus developing SBO. Showed improvement in previous peripancreatic edema.  Also noted was diverticulosis and partially thrombosed 4.5 cm infrarenal aortic aneurysm with continued recommendation for follow-up imaging and follow-up with vascular specialist. General surgery consulted and felt SBO was very unlikely and that ileus secondary to the pancreatitis is more likely, recommended right upper quadrant ultrasound to further evaluate for etiology of pancreatitis.  Subjective: Seen and examined Resting well Passing gas, no nausea or vomiting Overnight afebrile BP stable.  Labs showing leukocytosis improved to 13.8 stable CMP  Assessment and plan:  Acute pancreatitis-likely idiopathic: Right upper quadrant ultrasound-fatty liver otherwise unremarkable.  TG level pending.Continue conservative treatment with diet as tolerated pain management, IV fluids antiemetics   Ileus Rule out SBO: CT findings noted as above.  Surgery has been consulted-less likely SBO, continue conservative management. May be bale ot start CLD and ADAT.   Hypertension HLD: BP stable continue lisinopril , Crestor    History of TIA Carotid artery disease Continue statin   Depression Anxiety Cont wellbutrin    Abnormal imaging > Partially thrombosed aortic  aneurysm.  Will need follow-up with vascular surgery outpatient.   PMR Continue home prednisone    Chronic pain Continue home Norco as above    DVT prophylaxis: heparin  injection 5,000 Units Start: 09/06/23 2200 Code Status:   Code Status: Full Code Family Communication: plan of care discussed with patient at bedside. Patient status is: Remains hospitalized because of severity of illness Level of care: Med-Surg   Dispo: The patient is from: HOME            Anticipated disposition: TBD Objective: Vitals last 24 hrs: Vitals:   09/06/23 1559 09/06/23 2046 09/07/23 0537 09/07/23 0742  BP: 136/78 (!) 159/86 121/79 (!) 142/89  Pulse: 93 90 89 86  Resp: 18 18 18    Temp: 97.6 F (36.4 C) 97.9 F (36.6 C) 98.4 F (36.9 C) 98.2 F (36.8 C)  TempSrc: Oral Oral Oral   SpO2: 100% 100% 100% 99%    Physical Examination: General exam: alert awake, older than stated age HEENT:Oral mucosa moist, Ear/Nose WNL grossly Respiratory system: Bilaterally CLEAR BS, no use of accessory muscle Cardiovascular system: S1 & S2 +. Gastrointestinal system: Abdomen soft, mildly tender NT,ND,BS+ Nervous System: Alert, awake,  following commands. Extremities: LE edema neg, warm extremities Skin: No rashes,warm. MSK: Normal muscle bulk/tone.   Data Reviewed: I have personally reviewed following labs and imaging studies ( see epic result tab) CBC: Recent Labs  Lab 09/03/23 2054 09/06/23 0830 09/07/23 0402  WBC 19.7* 23.4* 13.8*  NEUTROABS  --  21.3*  --   HGB 15.0 15.0 13.4  HCT 42.7 42.9 40.6  MCV 96.8 97.5 102.5*  PLT 377 306 278   CMP: Recent Labs  Lab 09/03/23 2054 09/06/23 0830 09/07/23 0402  NA 135 139 138  K 4.0 3.7 4.0  CL 100 104 103  CO2 21* 23 24  GLUCOSE 102* 146* 82  BUN 24*  10 13  CREATININE 0.76 0.62 0.62  CALCIUM  9.4 9.5 8.1*   GFR: Estimated Creatinine Clearance: 83.1 mL/min (by C-G formula based on SCr of 0.62 mg/dL). Recent Labs  Lab 09/03/23 2054 09/06/23 0830  09/07/23 0402  AST 27 23 19   ALT 26 15 14   ALKPHOS 91 89 52  BILITOT 1.2 0.5 0.8  PROT 7.1 6.7 5.4*  ALBUMIN 4.2 3.8 2.6*    Recent Labs  Lab 09/03/23 2054 09/06/23 0830  LIPASE 379* 142*   No results for input(s): AMMONIA in the last 168 hours. Coagulation Profile: No results for input(s): INR, PROTIME in the last 168 hours. Unresulted Labs (From admission, onward)    None      Antimicrobials/Microbiology: Anti-infectives (From admission, onward)    None      No results found for: SDES, SPECREQUEST, CULT, REPTSTATUS  Procedures:  Medications reviewed:  Scheduled Meds:  buPROPion   300 mg Oral q morning   heparin   5,000 Units Subcutaneous Q8H   HYDROcodone -acetaminophen   1 tablet Oral TID   lisinopril   40 mg Oral Daily   polyethylene glycol  17 g Oral BID   rosuvastatin   5 mg Oral Daily   sodium chloride  flush  3 mL Intravenous Q12H   Continuous Infusions:  sodium chloride  125 mL/hr at 09/07/23 0427    Mennie LAMY, MD [facility] 09/07/2023, 11:36 AM

## 2023-09-07 NOTE — Progress Notes (Signed)
 Transition of Care [facility]) - Inpatient Brief Assessment   Patient Details  Name: Victor Ramirez MRN: [id number] Date of Birth: 1955/06/17  [facility] CM/SW Contact:    Rosaline JONELLE Joe, RN Phone Number: 09/07/2023, 1:42 PM   Clinical Narrative: No TOC needs at this time.  Patient admitted for Acute pancreatitis and RUQ US  was ordered by the MD.  CM will continue to follow the patient as patient progresses.   Transition of Care Asessment: Insurance and Status: (P) Insurance coverage has been reviewed Patient has primary care physician: (P) Yes Home environment has been reviewed: (P) from home     Social Drivers of Health Review: (P) SDOH reviewed no interventions necessary Readmission risk has been reviewed: (P) Yes Transition of care needs: (P) no transition of care needs at this time

## 2023-09-07 NOTE — Care Management Obs Status (Signed)
 MEDICARE OBSERVATION STATUS NOTIFICATION   Patient Details  Name: Victor Ramirez MRN: [id number] Date of Birth: 1955-06-14   Medicare Observation Status Notification Given:  Yes  Obs letter signed copy given  Claretta Deed 09/07/2023, 9:47 AM

## 2023-09-07 NOTE — Progress Notes (Signed)
 Progress Note     Subjective: Pt with continued generalized abdominal pain. Reports flatulence. Reports decreased bloating. Denies nausea and has not had a bowel movement. Patient denies worsening symptoms or new concerns.  Objective: Vital signs in last 24 hours: Temp:  [97.6 F (36.4 C)-98.5 F (36.9 C)] 98.2 F (36.8 C) (07/08 0742) Pulse Rate:  [70-106] 86 (07/08 0742) Resp:  [16-18] 18 (07/08 0537) BP: (115-159)/(73-94) 142/89 (07/08 0742) SpO2:  [89 %-100 %] 99 % (07/08 0742) Last BM Date : 09/05/23  Intake/Output from previous day: 07/07 0701 - 07/08 0700 In: -  Out: 400 [Urine:400] Intake/Output this shift: Total I/O In: -  Out: 200 [Urine:200]  PE: General: Pleasant, WD, obese male who is laying in bed in NAD. HEENT: Sclera anicteric, EOMI Heart: Regular, rate, and rhythm. Lungs: Respiratory effort nonlabored Abd: Soft and distended. Generalized mild tenderness to palpation without peritonitis. Psych: A&Ox3 with an appropriate affect.    Lab Results:  Recent Labs    09/06/23 0830 09/07/23 0402  WBC 23.4* 13.8*  HGB 15.0 13.4  HCT 42.9 40.6  PLT 306 278   BMET Recent Labs    09/06/23 0830 09/07/23 0402  NA 139 138  K 3.7 4.0  CL 104 103  CO2 23 24  GLUCOSE 146* 82  BUN 10 13  CREATININE 0.62 0.62  CALCIUM  9.5 8.1*   PT/INR No results for input(s): LABPROT, INR in the last 72 hours. CMP     Component Value Date/Time   NA 138 09/07/2023 0402   K 4.0 09/07/2023 0402   CL 103 09/07/2023 0402   CO2 24 09/07/2023 0402   GLUCOSE 82 09/07/2023 0402   BUN 13 09/07/2023 0402   CREATININE 0.62 09/07/2023 0402   CREATININE 0.70 04/30/2022 1055   CALCIUM  8.1 (L) 09/07/2023 0402   PROT 5.4 (L) 09/07/2023 0402   ALBUMIN 2.6 (L) 09/07/2023 0402   AST 19 09/07/2023 0402   ALT 14 09/07/2023 0402   ALKPHOS 52 09/07/2023 0402   BILITOT 0.8 09/07/2023 0402   GFRNONAA >60 09/07/2023 0402   GFRNONAA 96 08/08/2020 1411   GFRAA 111 08/08/2020  1411   Lipase     Component Value Date/Time   LIPASE 142 (H) 09/06/2023 0830       Studies/Results: US  Abdomen Limited RUQ (LIVER/GB) Result Date: 09/06/2023 CLINICAL DATA:  Pancreatitis. EXAM: ULTRASOUND ABDOMEN LIMITED RIGHT UPPER QUADRANT COMPARISON:  None Available. FINDINGS: Evaluation is limited due to overlying bowel gas. Gallbladder: No gallstones or wall thickening visualized. No sonographic Murphy sign noted by sonographer. Common bile duct: Diameter: 4 mm Liver: There is diffuse increased liver echogenicity most commonly seen in the setting of fatty infiltration. Superimposed inflammation or fibrosis is not excluded. Clinical correlation is recommended. Portal vein is patent on color Doppler imaging with normal direction of blood flow towards the liver. The pancreas is poorly visualized and obscured by bowel gas. Other: Trace subhepatic ascites. IMPRESSION: Fatty liver, otherwise unremarkable right upper quadrant ultrasound. Electronically Signed   By: Vanetta Chou M.D.   On: 09/06/2023 19:05   CT ABDOMEN PELVIS W CONTRAST Result Date: 09/06/2023 CLINICAL DATA:  Abdominal pain. EXAM: CT ABDOMEN AND PELVIS WITH CONTRAST TECHNIQUE: Multidetector CT imaging of the abdomen and pelvis was performed using the standard protocol following bolus administration of intravenous contrast. RADIATION DOSE REDUCTION: This exam was performed according to the departmental dose-optimization program which includes automated exposure control, adjustment of the mA and/or kV according to patient size and/or use  of iterative reconstruction technique. CONTRAST:  OMNIPAQUE  IOHEXOL  300 MG/ML  SOLN COMPARISON:  CT abdomen pelvis dated 09/03/2023. FINDINGS: Lower chest: Minimal bibasilar dependent atelectasis. There is coronary vascular calcification. No intra-abdominal free air.  Small ascites. Hepatobiliary: The liver is unremarkable. No biliary dilatation. The gallbladder is unremarkable. Pancreas:  Significant improvement in previously seen peripancreatic edema. No dilatation of the main pancreatic duct or gland atrophy. No abscess or pseudocyst. Spleen: Normal in size without focal abnormality. Adrenals/Urinary Tract: The adrenal glands are unremarkable. There is no hydronephrosis on either side. There is symmetric enhancement and excretion of contrast by both kidneys. The visualized ureters and urinary bladder appear unremarkable. Stomach/Bowel: There is distal colonic diverticulosis. Mild diffuse dilatation of small bowel measure up to 3.2 cm. The terminal ileum is collapsed. A gradual transition noted in the right lower abdomen. Findings favor to represent an ileus reactive to recent pancreatitis. Developing obstruction is not excluded. Small-bowel series may provide better evaluation. The appendix is normal. Vascular/Lymphatic: Moderate aortoiliac atherosclerotic disease. Partially thrombosed 4.5 cm infrarenal abdominal aortic aneurysm. Follow-up as per recommendation of prior CT. The IVC is unremarkable. No portal venous gas. There is no adenopathy. Reproductive: The prostate and seminal vesicles are grossly unremarkable. No pelvic mass. Other: None Musculoskeletal: Degenerative changes of the spine. Lower lumbar posterior fusion. No acute osseous pathology. IMPRESSION: 1. Ileus versus developing small bowel obstruction. 2. Significant improvement in previously seen peripancreatic edema. No abscess or pseudocyst. 3. Distal colonic diverticulosis. Normal appendix. 4. Partially thrombosed 4.5 cm infrarenal abdominal aortic aneurysm. Follow-up as per recommendation of prior CT. 5.  Aortic Atherosclerosis (ICD10-I70.0). Electronically Signed   By: Vanetta Chou M.D.   On: 09/06/2023 10:42    Anti-infectives: Anti-infectives (From admission, onward)    None        Assessment/Plan Idiopathic pancreatitis with ileus vs SBO  -CT 7/4 with acute pancreatitis without organized fluid collection, no  cholelithiasis noted  -CT 7/7 with improvement in pancreatic edema, no abscess or pseudocyst, concern for developing ileus vs obstruction. Stool noted of the transverse colon. -U/S on 7/7 showed no fall stones or wall thickening visualized. Fatty liver present. Otherwise unremarkable. -Lipase was 379 on 7/4 and 142 7/7. No elevation of LFTs. -Will initiate clear liquids today -Will provider Miralax  BID.  FEN: Clear liquids VTE: Heparin  injections ID: None  - per [facility] - HTN HLD History of TIA Carotid artery disease Depression Anxiety Abnormal imaging - Partially thrombosed aortic aneurysm  PMR Chronic pain    LOS: 0 days   I reviewed hospitalist notes, last 24 h vitals and pain scores, last 48 h intake and output, last 24 h labs and trends, and last 24 h imaging results.  This care required moderate level of medical decision making.    Marjorie Carlyon Favre, [facility] 09/07/2023, 9:38 AM Please see Amion for pager number during day hours 7:00am-4:30pm

## 2023-09-07 NOTE — Plan of Care (Signed)
   Problem: Activity: Goal: Risk for activity intolerance will decrease Outcome: Progressing   Problem: Nutrition: Goal: Adequate nutrition will be maintained Outcome: Progressing

## 2023-09-07 NOTE — Hospital Course (Addendum)
 68 yo m with medical history significant of hypertension, hyperlipidemia, TIA,carotid artery disease,depression,anxiety, sleep apnea, PMR, postpolio syndrome,chronic back pain presenting with abdominal pain. Initially seen on 7/4 and was seen in the ED- CT showed evidence of uncomplicated pancreatitis.patient returns after persistent pain with associated nausea vomiting x 2 In ZI:Cpujo stable. Labs leukocytosis to 23.4.  Lipase improving but still elevated at 142.  Urinalysis showed protein only. CT of the abdomen pelvis>>ileus versus developing SBO. Showed improvement in previous peripancreatic edema.  Also noted was diverticulosis and partially thrombosed 4.5 cm infrarenal aortic aneurysm with continued recommendation for follow-up imaging and follow-up with vascular specialist. General surgery consulted and felt SBO was very unlikely and that ileus secondary to the pancreatitis is more likely, recommended right upper quadrant ultrasound to further evaluate for etiology of pancreatitis. Ultrasound showed fatty liver, otherwise unremarkable, patient was managed conservatively diet was slowly advanced He has been tolerating well, having bowel movement ambulating well.  Today he is requesting for discharge home.  Subjective: Seen and examined this morning Requesting for discharge No complaints tolerating Overnight afebrile BP stable diet advanced to regular diet 7/9   Discharge diagnosis:  Acute pancreatitis-likely idiopathic Ileus-Ruled out SBO: Right upper quadrant ultrasound-fatty liver otherwise unremarkable.  TG normal. Managed conservatively w/ diet ADOD, antiemetics IV fluids pain management  Tolerating diet ambulating.  Having bowel movement.  No abdomen pain. Continue diet, discussed alcohol cessation which could be contributing to the pancreatitis   Hypertension HLD: BP borderline controlled Cont prn hydralazine  po, continue lisinopril , Crestor    History of TIA Carotid artery  disease Continue statin   Depression Anxiety Cont wellbutrin    Abnormal imaging Partially thrombosed aortic aneurysm.  Will need follow-up with vascular surgery outpatient. He is aware about this finding and he has a follow-up with vascular surgery-he also has stenosis in his neck he says  PMR Patient reported being off of prednisone .  Follow-up with PCP    Chronic pain Continue home Norco as above

## 2023-09-08 DIAGNOSIS — K859 Acute pancreatitis without necrosis or infection, unspecified: Secondary | ICD-10-CM | POA: Diagnosis not present

## 2023-09-08 LAB — CBC
HCT: 34.3 % — ABNORMAL LOW (ref 39.0–52.0)
Hemoglobin: 11.5 g/dL — ABNORMAL LOW (ref 13.0–17.0)
MCH: 33.7 pg (ref 26.0–34.0)
MCHC: 33.5 g/dL (ref 30.0–36.0)
MCV: 100.6 fL — ABNORMAL HIGH (ref 80.0–100.0)
Platelets: 259 K/uL (ref 150–400)
RBC: 3.41 MIL/uL — ABNORMAL LOW (ref 4.22–5.81)
RDW: 12.9 % (ref 11.5–15.5)
WBC: 11.7 K/uL — ABNORMAL HIGH (ref 4.0–10.5)
nRBC: 0 % (ref 0.0–0.2)

## 2023-09-08 LAB — COMPREHENSIVE METABOLIC PANEL WITH GFR
ALT: 15 U/L (ref 0–44)
AST: 21 U/L (ref 15–41)
Albumin: 2.6 g/dL — ABNORMAL LOW (ref 3.5–5.0)
Alkaline Phosphatase: 48 U/L (ref 38–126)
Anion gap: 6 (ref 5–15)
BUN: 8 mg/dL (ref 8–23)
CO2: 25 mmol/L (ref 22–32)
Calcium: 7.8 mg/dL — ABNORMAL LOW (ref 8.9–10.3)
Chloride: 103 mmol/L (ref 98–111)
Creatinine, Ser: 0.59 mg/dL — ABNORMAL LOW (ref 0.61–1.24)
GFR, Estimated: >60 mL/min (ref >60)
Glucose, Bld: 86 mg/dL (ref 70–99)
Potassium: 4.3 mmol/L (ref 3.5–5.1)
Sodium: 134 mmol/L — ABNORMAL LOW (ref 135–145)
Total Bilirubin: 0.7 mg/dL (ref 0.0–1.2)
Total Protein: 5.3 g/dL — ABNORMAL LOW (ref 6.5–8.1)

## 2023-09-08 MED ORDER — HYDRALAZINE HCL 25 MG PO TABS: 25.0000 mg | ORAL_TABLET | Freq: Four times a day (QID) | ORAL | Status: DC | PRN

## 2023-09-08 MED ORDER — BOOST / RESOURCE BREEZE PO LIQD CUSTOM
1.0000 | Freq: Three times a day (TID) | ORAL | Status: DC
  Administered 2023-09-08 (×2): 1 via ORAL

## 2023-09-08 MED ORDER — POLYETHYLENE GLYCOL 3350 17 G PO PACK
34.0000 g | PACK | Freq: Two times a day (BID) | ORAL | Status: DC
  Administered 2023-09-08: 34 g via ORAL
  Filled 2023-09-08: qty 2

## 2023-09-08 NOTE — Progress Notes (Signed)
 Progress Note     Subjective: Pt continues to have generalized abdominal pain, bloating, and nausea, but reports improvement. Reports flatulence. Had a very small bowel movement. Denies vomiting or new concerns.   Objective: Vital signs in last 24 hours: Temp:  [97.6 F (36.4 C)-98.2 F (36.8 C)] 98.2 F (36.8 C) (07/09 0845) Pulse Rate:  [84-101] 91 (07/09 0845) Resp:  [18] 18 (07/09 0845) BP: (146-180)/(80-106) 146/80 (07/09 0845) SpO2:  [99 %-100 %] 99 % (07/09 0845) Last BM Date : 09/05/23  Intake/Output from previous day: 07/08 0701 - 07/09 0700 In: 1382.5 [P.O.:360; I.V.:1022.5] Out: 1100 [Urine:1100] Intake/Output this shift: Total I/O In: -  Out: 600 [Urine:600]  PE: General: pleasant, WD, obese male who is laying in bed in NAD. HEENT: head is normocephalic, atraumatic. Sclera anicteric, EOMI. Heart: Regular, rate, and rhythm. Lungs: Respiratory effort nonlabored Abd: Soft with mild to moderate distention. Generalized mild tenderness to palpation.  Psych: A&Ox3 with an appropriate affect.    Lab Results:  Recent Labs    09/07/23 0402 09/08/23 0231  WBC 13.8* 11.7*  HGB 13.4 11.5*  HCT 40.6 34.3*  PLT 278 259   BMET Recent Labs    09/07/23 0402 09/08/23 0231  NA 138 134*  K 4.0 4.3  CL 103 103  CO2 24 25  GLUCOSE 82 86  BUN 13 8  CREATININE 0.62 0.59*  CALCIUM  8.1* 7.8*   PT/INR No results for input(s): LABPROT, INR in the last 72 hours. CMP     Component Value Date/Time   NA 134 (L) 09/08/2023 0231   K 4.3 09/08/2023 0231   CL 103 09/08/2023 0231   CO2 25 09/08/2023 0231   GLUCOSE 86 09/08/2023 0231   BUN 8 09/08/2023 0231   CREATININE 0.59 (L) 09/08/2023 0231   CREATININE 0.70 04/30/2022 1055   CALCIUM  7.8 (L) 09/08/2023 0231   PROT 5.3 (L) 09/08/2023 0231   ALBUMIN 2.6 (L) 09/08/2023 0231   AST 21 09/08/2023 0231   ALT 15 09/08/2023 0231   ALKPHOS 48 09/08/2023 0231   BILITOT 0.7 09/08/2023 0231   GFRNONAA >60  09/08/2023 0231   GFRNONAA 96 08/08/2020 1411   GFRAA 111 08/08/2020 1411   Lipase     Component Value Date/Time   LIPASE 142 (H) 09/06/2023 0830     Studies/Results: US  Abdomen Limited RUQ (LIVER/GB) Result Date: 09/06/2023 CLINICAL DATA:  Pancreatitis. EXAM: ULTRASOUND ABDOMEN LIMITED RIGHT UPPER QUADRANT COMPARISON:  None Available. FINDINGS: Evaluation is limited due to overlying bowel gas. Gallbladder: No gallstones or wall thickening visualized. No sonographic Murphy sign noted by sonographer. Common bile duct: Diameter: 4 mm Liver: There is diffuse increased liver echogenicity most commonly seen in the setting of fatty infiltration. Superimposed inflammation or fibrosis is not excluded. Clinical correlation is recommended. Portal vein is patent on color Doppler imaging with normal direction of blood flow towards the liver. The pancreas is poorly visualized and obscured by bowel gas. Other: Trace subhepatic ascites. IMPRESSION: Fatty liver, otherwise unremarkable right upper quadrant ultrasound. Electronically Signed   By: Vanetta Chou M.D.   On: 09/06/2023 19:05   CT ABDOMEN PELVIS W CONTRAST Result Date: 09/06/2023 CLINICAL DATA:  Abdominal pain. EXAM: CT ABDOMEN AND PELVIS WITH CONTRAST TECHNIQUE: Multidetector CT imaging of the abdomen and pelvis was performed using the standard protocol following bolus administration of intravenous contrast. RADIATION DOSE REDUCTION: This exam was performed according to the departmental dose-optimization program which includes automated exposure control, adjustment of the mA and/or  kV according to patient size and/or use of iterative reconstruction technique. CONTRAST:  OMNIPAQUE  IOHEXOL  300 MG/ML  SOLN COMPARISON:  CT abdomen pelvis dated 09/03/2023. FINDINGS: Lower chest: Minimal bibasilar dependent atelectasis. There is coronary vascular calcification. No intra-abdominal free air.  Small ascites. Hepatobiliary: The liver is unremarkable. No  biliary dilatation. The gallbladder is unremarkable. Pancreas: Significant improvement in previously seen peripancreatic edema. No dilatation of the main pancreatic duct or gland atrophy. No abscess or pseudocyst. Spleen: Normal in size without focal abnormality. Adrenals/Urinary Tract: The adrenal glands are unremarkable. There is no hydronephrosis on either side. There is symmetric enhancement and excretion of contrast by both kidneys. The visualized ureters and urinary bladder appear unremarkable. Stomach/Bowel: There is distal colonic diverticulosis. Mild diffuse dilatation of small bowel measure up to 3.2 cm. The terminal ileum is collapsed. A gradual transition noted in the right lower abdomen. Findings favor to represent an ileus reactive to recent pancreatitis. Developing obstruction is not excluded. Small-bowel series may provide better evaluation. The appendix is normal. Vascular/Lymphatic: Moderate aortoiliac atherosclerotic disease. Partially thrombosed 4.5 cm infrarenal abdominal aortic aneurysm. Follow-up as per recommendation of prior CT. The IVC is unremarkable. No portal venous gas. There is no adenopathy. Reproductive: The prostate and seminal vesicles are grossly unremarkable. No pelvic mass. Other: None Musculoskeletal: Degenerative changes of the spine. Lower lumbar posterior fusion. No acute osseous pathology. IMPRESSION: 1. Ileus versus developing small bowel obstruction. 2. Significant improvement in previously seen peripancreatic edema. No abscess or pseudocyst. 3. Distal colonic diverticulosis. Normal appendix. 4. Partially thrombosed 4.5 cm infrarenal abdominal aortic aneurysm. Follow-up as per recommendation of prior CT. 5.  Aortic Atherosclerosis (ICD10-I70.0). Electronically Signed   By: Vanetta Chou M.D.   On: 09/06/2023 10:42    Anti-infectives: Anti-infectives (From admission, onward)    None        Assessment/Plan Idiopathic pancreatitis with ileus vs SBO  -CT 7/4  with acute pancreatitis without organized fluid collection, no cholelithiasis noted. -CT 7/7 with improvement in pancreatic edema, no abscess or pseudocyst, concern for developing ileus vs obstruction. Stool noted of the transverse colon. -U/S on 7/7 showed no gallstones or wall thickening visualized. Fatty liver present. Otherwise unremarkable. -Lipase was 379 on 7/4 and 142 7/7. No elevation of LFTs. -WBC improved to 11.7 from 13.8 on 7/8, 23.4 on 7/7. -Will advance to full liquid diet.  -Continue Miralax  BID but increase dose to 34 grams (takes this at home) if needed.  FEN: Full liquids VTE: Heparin  injections ID: None    LOS: 1 day   I reviewed hospitalist notes, last 24 h vitals and pain scores, last 48 h intake and output, last 24 h labs and trends, and last 24 h imaging results.  This care required moderate level of medical decision making.    Marjorie Carlyon Favre, [facility] Surgery 09/08/2023, 9:11 AM Please see Amion for pager number during day hours 7:00am-4:30pm

## 2023-09-08 NOTE — Plan of Care (Signed)

## 2023-09-08 NOTE — Plan of Care (Signed)

## 2023-09-08 NOTE — Progress Notes (Signed)
 PROGRESS NOTE MEET [place]  FMW:[id number] DOB: Oct 09, 1955 DOA: 09/06/2023 PCP: Alben Therisa MATSU, PA  Brief Narrative/Hospital Course: 68 yo m with medical history significant of hypertension, hyperlipidemia, TIA,carotid artery disease,depression,anxiety, sleep apnea, PMR, postpolio syndrome,chronic back pain presenting with abdominal pain. Initially seen on 7/4 and was seen in the ED- CT showed evidence of uncomplicated pancreatitis.patient returns after persistent pain with associated nausea vomiting x 2 In ZI:Cpujo stable. Labs leukocytosis to 23.4.  Lipase improving but still elevated at 142.  Urinalysis showed protein only. CT of the abdomen pelvis>>ileus versus developing SBO. Showed improvement in previous peripancreatic edema.  Also noted was diverticulosis and partially thrombosed 4.5 cm infrarenal aortic aneurysm with continued recommendation for follow-up imaging and follow-up with vascular specialist. General surgery consulted and felt SBO was very unlikely and that ileus secondary to the pancreatitis is more likely, recommended right upper quadrant ultrasound to further evaluate for etiology of pancreatitis.  Subjective: Seen/examined C/o lower abdomen pain and bloating and nausea, passing gas no BM yet Wants to stop iv fluids as he has been peeing a lot and wants to walk around-requested to stop fluids for now Overnight afebrile BP on higher side CBC CMP shows improving leukocytosis low ALBUMIN, normal LFTs   Assessment and plan:  Acute pancreatitis-likely idiopathic Ileus Ruled out SBO: Right upper quadrant ultrasound-fatty liver otherwise unremarkable.  TG normal Continue conservative treatment with diet as tolerated- on CLD- Cont per [facility] Cont pain management, IV fluids to hold for now as he wants to move around. Cont  antiemetics  Cont to mobilize PT OT, continue MiraLAX  twice daily, stool softener- intensify per ccs   Hypertension HLD: BP borderline controlled add as  needed hydralazine  po, continue lisinopril , Crestor    History of TIA Carotid artery disease Continue statin   Depression Anxiety Cont wellbutrin    Abnormal imaging > Partially thrombosed aortic aneurysm.  Will need follow-up with vascular surgery outpatient.   PMR Continue home prednisone    Chronic pain Continue home Norco as above    DVT prophylaxis: heparin  injection 5,000 Units Start: 09/06/23 2200 Code Status:   Code Status: Full Code Family Communication: plan of care discussed with patient at bedside. Patient status is: Remains hospitalized because of severity of illness Level of care: Med-Surg   Dispo: The patient is from: HOME            Anticipated disposition: TBD Objective: Vitals last 24 hrs: Vitals:   09/07/23 2007 09/08/23 0002 09/08/23 0405 09/08/23 0845  BP: (!) 153/96 (!) 159/94 (!) 180/106 (!) 146/80  Pulse: 84 88 92 91  Resp: 18 18 18 18   Temp: 97.7 F (36.5 C) 97.6 F (36.4 C) 97.8 F (36.6 C) 98.2 F (36.8 C)  TempSrc: Oral Oral Oral   SpO2: 100% 100% 99% 99%    Physical Examination: General exam: alert awake, oriented at baseline, older than stated age HEENT:Oral mucosa moist, Ear/Nose WNL grossly Respiratory system: Bilaterally clear BS,no use of accessory muscle Cardiovascular system: S1 & S2 +, No JVD. Gastrointestinal system: Abdomen soft, with generalized tenderness, distended, bowel sounds sluggish  Nervous System: Alert, awake, moving all extremities,and following commands. Extremities: LE edema neg,distal peripheral pulses palpable and warm.  Skin: No rashes,no icterus. MSK: Normal muscle bulk,tone, power   Data Reviewed: I have personally reviewed following labs and imaging studies ( see epic result tab) CBC: Recent Labs  Lab 09/03/23 2054 09/06/23 0830 09/07/23 0402 09/08/23 0231  WBC 19.7* 23.4* 13.8* 11.7*  NEUTROABS  --  21.3*  --   --  HGB 15.0 15.0 13.4 11.5*  HCT 42.7 42.9 40.6 34.3*  MCV 96.8 97.5 102.5* 100.6*   PLT 377 306 278 259   CMP: Recent Labs  Lab 09/03/23 2054 09/06/23 0830 09/07/23 0402 09/08/23 0231  NA 135 139 138 134*  K 4.0 3.7 4.0 4.3  CL 100 104 103 103  CO2 21* 23 24 25   GLUCOSE 102* 146* 82 86  BUN 24* 10 13 8   CREATININE 0.76 0.62 0.62 0.59*  CALCIUM  9.4 9.5 8.1* 7.8*   GFR: Estimated Creatinine Clearance: 83.1 mL/min (A) (by C-G formula based on SCr of 0.59 mg/dL (L)). Recent Labs  Lab 09/03/23 2054 09/06/23 0830 09/07/23 0402 09/08/23 0231  AST 27 23 19 21   ALT 26 15 14 15   ALKPHOS 91 89 52 48  BILITOT 1.2 0.5 0.8 0.7  PROT 7.1 6.7 5.4* 5.3*  ALBUMIN 4.2 3.8 2.6* 2.6*    Recent Labs  Lab 09/03/23 2054 09/06/23 0830  LIPASE 379* 142*   No results for input(s): AMMONIA in the last 168 hours. Coagulation Profile: No results for input(s): INR, PROTIME in the last 168 hours. Unresulted Labs (From admission, onward)     Start     Ordered   09/09/23 0500  CBC  Daily,   R      09/08/23 0820   09/09/23 0500  Comprehensive metabolic panel with GFR  Daily,   R      09/08/23 0820           Antimicrobials/Microbiology: Anti-infectives (From admission, onward)    None      No results found for: SDES, SPECREQUEST, CULT, REPTSTATUS  Procedures:  Medications reviewed:  Scheduled Meds:  buPROPion   300 mg Oral q morning   feeding supplement  1 Container Oral TID BM   heparin   5,000 Units Subcutaneous Q8H   HYDROcodone -acetaminophen   1 tablet Oral TID   lisinopril   40 mg Oral Daily   polyethylene glycol  34 g Oral BID   rosuvastatin   5 mg Oral Daily   sodium chloride  flush  3 mL Intravenous Q12H   Continuous Infusions:    Mennie LAMY, MD [facility] 09/08/2023, 10:36 AM

## 2023-09-09 ENCOUNTER — Ambulatory Visit: Admitting: Physical Therapy

## 2023-09-09 DIAGNOSIS — K859 Acute pancreatitis without necrosis or infection, unspecified: Secondary | ICD-10-CM | POA: Diagnosis not present

## 2023-09-09 LAB — COMPREHENSIVE METABOLIC PANEL WITH GFR
ALT: 15 U/L (ref 0–44)
AST: 20 U/L (ref 15–41)
Albumin: 2.8 g/dL — ABNORMAL LOW (ref 3.5–5.0)
Alkaline Phosphatase: 50 U/L (ref 38–126)
Anion gap: 8 (ref 5–15)
BUN: 5 mg/dL — ABNORMAL LOW (ref 8–23)
CO2: 25 mmol/L (ref 22–32)
Calcium: 8.7 mg/dL — ABNORMAL LOW (ref 8.9–10.3)
Chloride: 105 mmol/L (ref 98–111)
Creatinine, Ser: 0.68 mg/dL (ref 0.61–1.24)
GFR, Estimated: >60 mL/min (ref >60)
Glucose, Bld: 105 mg/dL — ABNORMAL HIGH (ref 70–99)
Potassium: 3.8 mmol/L (ref 3.5–5.1)
Sodium: 138 mmol/L (ref 135–145)
Total Bilirubin: 0.7 mg/dL (ref 0.0–1.2)
Total Protein: 5.5 g/dL — ABNORMAL LOW (ref 6.5–8.1)

## 2023-09-09 MED ORDER — METHOCARBAMOL 750 MG PO TABS: 750.0000 mg | ORAL_TABLET | Freq: Four times a day (QID) | ORAL | Status: DC | PRN

## 2023-09-09 NOTE — Discharge Summary (Signed)
 Physician Discharge Summary  Victor Ramirez FMW:[id number] DOB: May 18, 1955 DOA: 09/06/2023  PCP: Alben Therisa MATSU, PA  Admit date: 09/06/2023 Discharge date: 09/09/2023 Recommendations for Outpatient Follow-up:  Follow up with PCP in 1 weeks-call for appointment Please obtain BMP/CBC in one week Follow-up with vascular surgery as scheduled in 2 weeks  Discharge Dispo: home Discharge Condition: Stable Code Status:   Code Status: Full Code Diet recommendation:  Diet Order             Diet regular Room service appropriate? Yes; Fluid consistency: Thin  Diet effective now                    Brief/Interim Summary: 68 yo m with medical history significant of hypertension, hyperlipidemia, TIA,carotid artery disease,depression,anxiety, sleep apnea, PMR, postpolio syndrome,chronic back pain presenting with abdominal pain. Initially seen on 7/4 and was seen in the ED- CT showed evidence of uncomplicated pancreatitis.patient returns after persistent pain with associated nausea vomiting x 2 In ZI:[place] stable. Labs leukocytosis to 23.4.  Lipase improving but still elevated at 142.  Urinalysis showed protein only. CT of the abdomen pelvis>>ileus versus developing SBO. Showed improvement in previous peripancreatic edema.  Also noted was diverticulosis and partially thrombosed 4.5 cm infrarenal aortic aneurysm with continued recommendation for follow-up imaging and follow-up with vascular specialist. General surgery consulted and felt SBO was very unlikely and that ileus secondary to the pancreatitis is more likely, recommended right upper quadrant ultrasound to further evaluate for etiology of pancreatitis. Ultrasound showed fatty liver, otherwise unremarkable, patient was managed conservatively diet was slowly advanced He has been tolerating well, having bowel movement ambulating well.  Today he is requesting for discharge home.  Subjective: Seen and examined this morning Requesting for  discharge No complaints tolerating Overnight afebrile BP stable diet advanced to regular diet 7/9   Discharge diagnosis:  Acute pancreatitis-likely idiopathic Ileus-Ruled out SBO: Right upper quadrant ultrasound-fatty liver otherwise unremarkable.  TG normal. Managed conservatively w/ diet ADOD, antiemetics IV fluids pain management  Tolerating diet ambulating.  Having bowel movement.  No abdomen pain. Continue diet, discussed alcohol cessation which could be contributing to the pancreatitis   Hypertension HLD: BP borderline controlled Cont prn hydralazine  po, continue lisinopril , Crestor    History of TIA Carotid artery disease Continue statin   Depression Anxiety Cont wellbutrin    Abnormal imaging Partially thrombosed aortic aneurysm.  Will need follow-up with vascular surgery outpatient. He is aware about this finding and he has a follow-up with vascular surgery-he also has stenosis in his neck he says  PMR Patient reported being off of prednisone .  Follow-up with PCP    Chronic pain Continue home Norco as above    Discharge Exam: Vitals:   09/09/23 0442 09/09/23 0757  BP: (!) 150/83 (!) 148/85  Pulse: 84 89  Resp: 18 18  Temp: 98.3 F (36.8 C) 98 F (36.7 C)  SpO2: 99% 97%   General: Pt is alert, awake, not in acute distress Cardiovascular: RRR, S1/S2 +, no rubs, no gallops Respiratory: CTA bilaterally, no wheezing, no rhonchi Abdominal: Soft, NT, ND, bowel sounds + Extremities: no edema, no cyanosis  Discharge Instructions  Discharge Instructions     Discharge instructions   Complete by: As directed    Please call call MD or return to ER for similar or worsening recurring problem that brought you to hospital or if any fever,nausea/vomiting,abdominal pain, uncontrolled pain, chest pain,  shortness of breath or any other alarming symptoms.  Please follow-up your  doctor as instructed in a week time and call the office for appointment.  Please avoid  alcohol, smoking, or any other illicit substance and maintain healthy habits including taking your regular medications as prescribed.  You were cared for by a hospitalist during your hospital stay. If you have any questions about your discharge medications or the care you received while you were in the hospital after you are discharged, you can call the unit and ask to speak with the hospitalist on call if the hospitalist that took care of you is not available.  Once you are discharged, your primary care physician will handle any further medical issues. Please note that NO REFILLS for any discharge medications will be authorized once you are discharged, as it is imperative that you return to your primary care physician (or establish a relationship with a primary care physician if you do not have one) for your aftercare needs so that they can reassess your need for medications and monitor your lab values   Increase activity slowly   Complete by: As directed       Allergies as of 09/09/2023       Reactions   Bee Venom Anaphylaxis   Bee Pollen         Medication List     STOP taking these medications    ALPRAZolam  0.5 MG tablet Commonly known as: XANAX    amoxicillin-clavulanate 875-125 MG tablet Commonly known as: AUGMENTIN   predniSONE  5 MG tablet Commonly known as: DELTASONE        TAKE these medications    albuterol 108 (90 Base) MCG/ACT inhaler Commonly known as: VENTOLIN HFA Inhale 1 puff into the lungs every 6 (six) hours as needed for shortness of breath.   buPROPion  300 MG 24 hr tablet Commonly known as: WELLBUTRIN  XL Take 300 mg by mouth every morning.   cetirizine 10 MG tablet Commonly known as: ZYRTEC Take 10 mg by mouth daily as needed for allergies.   EPINEPHrine  0.15 MG/0.3ML injection Commonly known as: EPIPEN  JR Inject 0.15 mg into the muscle as needed for anaphylaxis.   fluticasone  50 MCG/ACT nasal spray Commonly known as: FLONASE  Place 2 sprays  into both nostrils daily.   gabapentin  300 MG capsule Commonly known as: NEURONTIN  Take 1 capsule (300 mg total) by mouth 3 (three) times daily. What changed:  how much to take when to take this reasons to take this   HYDROcodone -acetaminophen  7.5-325 MG tablet Commonly known as: NORCO Take 1 tablet by mouth in the morning, at noon, and at bedtime.   lisinopril  40 MG tablet Commonly known as: ZESTRIL  Take 40 mg by mouth daily.   methocarbamol  750 MG tablet Commonly known as: ROBAXIN  Take 750 mg by mouth every 6 (six) hours as needed for muscle spasms.   ondansetron  4 MG disintegrating tablet Commonly known as: ZOFRAN -ODT Take 1 tablet (4 mg total) by mouth every 8 (eight) hours as needed for nausea or vomiting.   rosuvastatin  5 MG tablet Commonly known as: CRESTOR  Take 5 mg by mouth daily.   VITAMIN D -3 PO Take by mouth.        Follow-up Information     Alben Therisa MATSU, PA Follow up in 1 week(s).   Specialty: Family Medicine Contact information: [phone] [address] [place] [address] [phone]                Allergies  Allergen Reactions   Bee Venom Anaphylaxis   Bee Pollen  The results of significant diagnostics from this hospitalization (including imaging, microbiology, ancillary and laboratory) are listed below for reference.    Microbiology: No results found for this or any previous visit (from the past 240 hours).  Procedures/Studies: US  Abdomen Limited RUQ (LIVER/GB) Result Date: 09/06/2023 CLINICAL DATA:  Pancreatitis. EXAM: ULTRASOUND ABDOMEN LIMITED RIGHT UPPER QUADRANT COMPARISON:  None Available. FINDINGS: Evaluation is limited due to overlying bowel gas. Gallbladder: No gallstones or wall thickening visualized. No sonographic Murphy sign noted by sonographer. Common bile duct: Diameter: 4 mm Liver: There is diffuse increased liver echogenicity most commonly seen in the setting of fatty infiltration. Superimposed  inflammation or fibrosis is not excluded. Clinical correlation is recommended. Portal vein is patent on color Doppler imaging with normal direction of blood flow towards the liver. The pancreas is poorly visualized and obscured by bowel gas. Other: Trace subhepatic ascites. IMPRESSION: Fatty liver, otherwise unremarkable right upper quadrant ultrasound. Electronically Signed   By: Vanetta Chou M.D.   On: 09/06/2023 19:05   CT ABDOMEN PELVIS W CONTRAST Result Date: 09/06/2023 CLINICAL DATA:  Abdominal pain. EXAM: CT ABDOMEN AND PELVIS WITH CONTRAST TECHNIQUE: Multidetector CT imaging of the abdomen and pelvis was performed using the standard protocol following bolus administration of intravenous contrast. RADIATION DOSE REDUCTION: This exam was performed according to the departmental dose-optimization program which includes automated exposure control, adjustment of the mA and/or kV according to patient size and/or use of iterative reconstruction technique. CONTRAST:  OMNIPAQUE  IOHEXOL  300 MG/ML  SOLN COMPARISON:  CT abdomen pelvis dated 09/03/2023. FINDINGS: Lower chest: Minimal bibasilar dependent atelectasis. There is coronary vascular calcification. No intra-abdominal free air.  Small ascites. Hepatobiliary: The liver is unremarkable. No biliary dilatation. The gallbladder is unremarkable. Pancreas: Significant improvement in previously seen peripancreatic edema. No dilatation of the main pancreatic duct or gland atrophy. No abscess or pseudocyst. Spleen: Normal in size without focal abnormality. Adrenals/Urinary Tract: The adrenal glands are unremarkable. There is no hydronephrosis on either side. There is symmetric enhancement and excretion of contrast by both kidneys. The visualized ureters and urinary bladder appear unremarkable. Stomach/Bowel: There is distal colonic diverticulosis. Mild diffuse dilatation of small bowel measure up to 3.2 cm. The terminal ileum is collapsed. A gradual transition  noted in the right lower abdomen. Findings favor to represent an ileus reactive to recent pancreatitis. Developing obstruction is not excluded. Small-bowel series may provide better evaluation. The appendix is normal. Vascular/Lymphatic: Moderate aortoiliac atherosclerotic disease. Partially thrombosed 4.5 cm infrarenal abdominal aortic aneurysm. Follow-up as per recommendation of prior CT. The IVC is unremarkable. No portal venous gas. There is no adenopathy. Reproductive: The prostate and seminal vesicles are grossly unremarkable. No pelvic mass. Other: None Musculoskeletal: Degenerative changes of the spine. Lower lumbar posterior fusion. No acute osseous pathology. IMPRESSION: 1. Ileus versus developing small bowel obstruction. 2. Significant improvement in previously seen peripancreatic edema. No abscess or pseudocyst. 3. Distal colonic diverticulosis. Normal appendix. 4. Partially thrombosed 4.5 cm infrarenal abdominal aortic aneurysm. Follow-up as per recommendation of prior CT. 5.  Aortic Atherosclerosis (ICD10-I70.0). Electronically Signed   By: Vanetta Chou M.D.   On: 09/06/2023 10:42   CT ABDOMEN PELVIS W CONTRAST Result Date: 09/03/2023 CLINICAL DATA:  Generalized abdominal pain EXAM: CT ABDOMEN AND PELVIS WITH CONTRAST TECHNIQUE: Multidetector CT imaging of the abdomen and pelvis was performed using the standard protocol following bolus administration of intravenous contrast. RADIATION DOSE REDUCTION: This exam was performed according to the departmental dose-optimization program which includes automated exposure control, adjustment  of the mA and/or kV according to patient size and/or use of iterative reconstruction technique. CONTRAST:  OMNIPAQUE  IOHEXOL  300 MG/ML  SOLN COMPARISON:  CT 12/02/2020 FINDINGS: Lower chest: Lung bases demonstrate no acute airspace disease. Hepatobiliary: Hepatic steatosis. No calcified gallstone or biliary dilatation Pancreas: Small to moderate peripancreatic  soft tissue stranding and trace fluid consistent with acute pancreatitis. No organized fluid collection Spleen: Normal in size without focal abnormality. Adrenals/Urinary Tract: Adrenal glands are normal. Kidneys show no hydronephrosis. The bladder is unremarkable Stomach/Bowel: The stomach is nonenlarged. Soft tissue stranding at the second and third portion of duodenum. Diverticular disease of the left colon without acute wall thickening Vascular/Lymphatic: Advanced aortic atherosclerosis. Infrarenal abdominal aortic aneurysm measuring up to 4.5 cm, previously 3.8 cm. Partial irregular thrombosis of aneurysm sac. No suspicious lymph nodes Reproductive: Prostate is unremarkable. Other: Negative for pelvic effusion or free air. Musculoskeletal: Chronic deformity of the sacrum. Detached leads in the posterior subcutaneous soft tissues of the lumbosacral region. Posterior spinal fusion hardware L3-L4. In mild superior endplate deformity at L1 appears chronic but new compared to 2022 CT IMPRESSION: 1. Findings consistent with acute pancreatitis. No organized fluid collection. 2. Hepatic steatosis. 3. Diverticular disease of the left colon without acute wall thickening. 4. Infrarenal abdominal aortic aneurysm measuring up to 4.5 cm, previously 3.8 cm. Recommend follow-up CT or MR as appropriate in 12 months and referral to or continued care with vascular specialist. (Ref.: J Vasc Surg. 2018; 67:2-77 and J Am Coll Radiol 2013;10(10):789-794.) 5. Aortic atherosclerosis. Aortic Atherosclerosis (ICD10-I70.0). Electronically Signed   By: Luke Bun M.D.   On: 09/03/2023 22:50    Labs: BNP (last 3 results) No results for input(s): BNP in the last 8760 hours. Basic Metabolic Panel: Recent Labs  Lab 09/03/23 2054 09/06/23 0830 09/07/23 0402 09/08/23 0231 09/09/23 0448  NA 135 139 138 134* 138  K 4.0 3.7 4.0 4.3 3.8  CL 100 104 103 103 105  CO2 21* 23 24 25 25   GLUCOSE 102* 146* 82 86 105*  BUN 24* 10 13  8  5*  CREATININE 0.76 0.62 0.62 0.59* 0.68  CALCIUM  9.4 9.5 8.1* 7.8* 8.7*   Liver Function Tests: Recent Labs  Lab 09/03/23 2054 09/06/23 0830 09/07/23 0402 09/08/23 0231 09/09/23 0448  AST 27 23 19 21 20   ALT 26 15 14 15 15   ALKPHOS 91 89 52 48 50  BILITOT 1.2 0.5 0.8 0.7 0.7  PROT 7.1 6.7 5.4* 5.3* 5.5*  ALBUMIN 4.2 3.8 2.6* 2.6* 2.8*   Recent Labs  Lab 09/03/23 2054 09/06/23 0830  LIPASE 379* 142*   No results for input(s): AMMONIA in the last 168 hours. CBC: Recent Labs  Lab 09/03/23 2054 09/06/23 0830 09/07/23 0402 09/08/23 0231  WBC 19.7* 23.4* 13.8* 11.7*  NEUTROABS  --  21.3*  --   --   HGB 15.0 15.0 13.4 11.5*  HCT 42.7 42.9 40.6 34.3*  MCV 96.8 97.5 102.5* 100.6*  PLT 377 306 278 259  Lipid Profile Recent Labs    09/06/23 1500  TRIG 102   Thyroid  function studies No results for input(s): TSH, T4TOTAL, T3FREE, THYROIDAB in the last 72 hours.  Invalid input(s): FREET3 Urinalysis    Component Value Date/Time   COLORURINE YELLOW 09/06/2023 1300   APPEARANCEUR CLEAR 09/06/2023 1300   LABSPEC >1.046 (H) 09/06/2023 1300   PHURINE 8.0 09/06/2023 1300   GLUCOSEU NEGATIVE 09/06/2023 1300   HGBUR NEGATIVE 09/06/2023 1300   BILIRUBINUR NEGATIVE 09/06/2023 1300  KETONESUR NEGATIVE 09/06/2023 1300   PROTEINUR 30 (A) 09/06/2023 1300   NITRITE NEGATIVE 09/06/2023 1300   LEUKOCYTESUR NEGATIVE 09/06/2023 1300   Sepsis Labs Recent Labs  Lab 09/03/23 2054 09/06/23 0830 09/07/23 0402 09/08/23 0231  WBC 19.7* 23.4* 13.8* 11.7*   Microbiology No results found for this or any previous visit (from the past 240 hours).  Time coordinating discharge: 35  minutes  SIGNED: Mennie LAMY, MD  [facility] 09/09/2023, 9:59 AM  If 7PM-7AM, please contact night-coverage [email or web address]

## 2023-09-10 DIAGNOSIS — I10 Essential (primary) hypertension: Secondary | ICD-10-CM | POA: Diagnosis not present

## 2023-09-15 ENCOUNTER — Ambulatory Visit: Admitting: Physical Therapy

## 2023-09-15 ENCOUNTER — Ambulatory Visit: Attending: Internal Medicine | Admitting: Internal Medicine

## 2023-09-15 ENCOUNTER — Encounter: Payer: Self-pay | Admitting: Internal Medicine

## 2023-09-15 ENCOUNTER — Encounter: Payer: Self-pay | Admitting: Physical Therapy

## 2023-09-15 VITALS — BP 101/68 | HR 101 | Resp 16 | Ht 67.0 in | Wt 169.0 lb

## 2023-09-15 DIAGNOSIS — Z7952 Long term (current) use of systemic steroids: Secondary | ICD-10-CM

## 2023-09-15 DIAGNOSIS — M25512 Pain in left shoulder: Secondary | ICD-10-CM | POA: Diagnosis not present

## 2023-09-15 DIAGNOSIS — M353 Polymyalgia rheumatica: Secondary | ICD-10-CM

## 2023-09-15 DIAGNOSIS — M25612 Stiffness of left shoulder, not elsewhere classified: Secondary | ICD-10-CM | POA: Diagnosis not present

## 2023-09-15 DIAGNOSIS — R768 Other specified abnormal immunological findings in serum: Secondary | ICD-10-CM | POA: Diagnosis not present

## 2023-09-15 DIAGNOSIS — M6281 Muscle weakness (generalized): Secondary | ICD-10-CM

## 2023-09-15 DIAGNOSIS — R2689 Other abnormalities of gait and mobility: Secondary | ICD-10-CM | POA: Diagnosis not present

## 2023-09-15 DIAGNOSIS — G8929 Other chronic pain: Secondary | ICD-10-CM | POA: Diagnosis not present

## 2023-09-15 DIAGNOSIS — M546 Pain in thoracic spine: Secondary | ICD-10-CM | POA: Diagnosis not present
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
## 2023-09-17 ENCOUNTER — Ambulatory Visit

## 2023-09-17 DIAGNOSIS — M6281 Muscle weakness (generalized): Secondary | ICD-10-CM

## 2023-09-17 DIAGNOSIS — G8929 Other chronic pain: Secondary | ICD-10-CM | POA: Diagnosis not present

## 2023-09-17 DIAGNOSIS — M546 Pain in thoracic spine: Secondary | ICD-10-CM

## 2023-09-17 DIAGNOSIS — R2689 Other abnormalities of gait and mobility: Secondary | ICD-10-CM | POA: Diagnosis not present

## 2023-09-17 DIAGNOSIS — M25512 Pain in left shoulder: Secondary | ICD-10-CM | POA: Diagnosis not present

## 2023-09-17 DIAGNOSIS — M25612 Stiffness of left shoulder, not elsewhere classified: Secondary | ICD-10-CM | POA: Diagnosis not present
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
## 2023-09-21 ENCOUNTER — Encounter: Payer: Self-pay | Admitting: Physical Therapy

## 2023-09-21 ENCOUNTER — Ambulatory Visit: Admitting: Physical Therapy

## 2023-09-21 DIAGNOSIS — M6281 Muscle weakness (generalized): Secondary | ICD-10-CM | POA: Diagnosis not present

## 2023-09-21 DIAGNOSIS — M25512 Pain in left shoulder: Secondary | ICD-10-CM | POA: Diagnosis not present

## 2023-09-21 DIAGNOSIS — R2689 Other abnormalities of gait and mobility: Secondary | ICD-10-CM

## 2023-09-21 DIAGNOSIS — G8929 Other chronic pain: Secondary | ICD-10-CM | POA: Diagnosis not present

## 2023-09-21 DIAGNOSIS — M546 Pain in thoracic spine: Secondary | ICD-10-CM | POA: Diagnosis not present

## 2023-09-21 DIAGNOSIS — M25612 Stiffness of left shoulder, not elsewhere classified: Secondary | ICD-10-CM | POA: Diagnosis not present
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
## 2023-09-22 ENCOUNTER — Encounter: Payer: Self-pay | Admitting: Physical Therapy

## 2023-09-22 ENCOUNTER — Ambulatory Visit: Admitting: Physical Therapy

## 2023-09-22 DIAGNOSIS — M546 Pain in thoracic spine: Secondary | ICD-10-CM | POA: Diagnosis not present

## 2023-09-22 DIAGNOSIS — M6281 Muscle weakness (generalized): Secondary | ICD-10-CM

## 2023-09-22 DIAGNOSIS — R2689 Other abnormalities of gait and mobility: Secondary | ICD-10-CM | POA: Diagnosis not present

## 2023-09-22 DIAGNOSIS — M25512 Pain in left shoulder: Secondary | ICD-10-CM | POA: Diagnosis not present

## 2023-09-22 DIAGNOSIS — G8929 Other chronic pain: Secondary | ICD-10-CM | POA: Diagnosis not present

## 2023-09-22 DIAGNOSIS — M25612 Stiffness of left shoulder, not elsewhere classified: Secondary | ICD-10-CM | POA: Diagnosis not present

## 2023-09-27 ENCOUNTER — Ambulatory Visit: Admitting: Physical Therapy

## 2023-09-28 DIAGNOSIS — I10 Essential (primary) hypertension: Secondary | ICD-10-CM | POA: Diagnosis not present

## 2023-09-28 DIAGNOSIS — K8500 Idiopathic acute pancreatitis without necrosis or infection: Secondary | ICD-10-CM | POA: Diagnosis not present

## 2023-09-28 DIAGNOSIS — K567 Ileus, unspecified: Secondary | ICD-10-CM | POA: Diagnosis not present

## 2023-09-28 DIAGNOSIS — I7143 Infrarenal abdominal aortic aneurysm, without rupture: Secondary | ICD-10-CM | POA: Diagnosis not present

## 2023-09-29 ENCOUNTER — Encounter: Payer: Self-pay | Admitting: Physical Therapy

## 2023-09-29 ENCOUNTER — Ambulatory Visit: Admitting: Physical Therapy

## 2023-09-29 DIAGNOSIS — M546 Pain in thoracic spine: Secondary | ICD-10-CM | POA: Diagnosis not present

## 2023-09-29 DIAGNOSIS — G8929 Other chronic pain: Secondary | ICD-10-CM

## 2023-09-29 DIAGNOSIS — M25612 Stiffness of left shoulder, not elsewhere classified: Secondary | ICD-10-CM | POA: Diagnosis not present

## 2023-09-29 DIAGNOSIS — R2689 Other abnormalities of gait and mobility: Secondary | ICD-10-CM

## 2023-09-29 DIAGNOSIS — M6281 Muscle weakness (generalized): Secondary | ICD-10-CM | POA: Diagnosis not present

## 2023-09-29 DIAGNOSIS — M25512 Pain in left shoulder: Secondary | ICD-10-CM | POA: Diagnosis not present
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
## 2023-09-30 DIAGNOSIS — I10 Essential (primary) hypertension: Secondary | ICD-10-CM | POA: Diagnosis not present

## 2023-09-30 DIAGNOSIS — F321 Major depressive disorder, single episode, moderate: Secondary | ICD-10-CM | POA: Diagnosis not present

## 2023-09-30 DIAGNOSIS — F320 Major depressive disorder, single episode, mild: Secondary | ICD-10-CM | POA: Diagnosis not present

## 2023-09-30 DIAGNOSIS — E7800 Pure hypercholesterolemia, unspecified: Secondary | ICD-10-CM | POA: Diagnosis not present

## 2023-10-02 ENCOUNTER — Ambulatory Visit: Attending: Diagnostic Neuroimaging

## 2023-10-02 DIAGNOSIS — M6281 Muscle weakness (generalized): Secondary | ICD-10-CM | POA: Diagnosis not present

## 2023-10-02 DIAGNOSIS — R2689 Other abnormalities of gait and mobility: Secondary | ICD-10-CM | POA: Diagnosis not present

## 2023-10-02 DIAGNOSIS — M546 Pain in thoracic spine: Secondary | ICD-10-CM | POA: Diagnosis not present
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
## 2023-10-03 NOTE — Addendum Note (Signed)
 Addended by: JOSHUA GUN on: 10/03/2023 07:34 PM   Modules accepted: Orders

## 2023-10-11 DIAGNOSIS — I10 Essential (primary) hypertension: Secondary | ICD-10-CM | POA: Diagnosis not present

## 2023-10-12 DIAGNOSIS — S91311A Laceration without foreign body, right foot, initial encounter: Secondary | ICD-10-CM | POA: Diagnosis not present

## 2023-10-14 ENCOUNTER — Ambulatory Visit

## 2023-10-19 ENCOUNTER — Ambulatory Visit

## 2023-10-20 ENCOUNTER — Ambulatory Visit: Admitting: Physical Therapy

## 2023-10-20 ENCOUNTER — Encounter: Payer: Self-pay | Admitting: Physical Therapy

## 2023-10-20 DIAGNOSIS — M6281 Muscle weakness (generalized): Secondary | ICD-10-CM | POA: Diagnosis not present

## 2023-10-20 DIAGNOSIS — M546 Pain in thoracic spine: Secondary | ICD-10-CM

## 2023-10-20 DIAGNOSIS — R2689 Other abnormalities of gait and mobility: Secondary | ICD-10-CM
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
## 2023-10-22 DIAGNOSIS — I7143 Infrarenal abdominal aortic aneurysm, without rupture: Secondary | ICD-10-CM | POA: Diagnosis not present

## 2023-10-26 ENCOUNTER — Ambulatory Visit

## 2023-10-28 ENCOUNTER — Ambulatory Visit: Payer: Medicare Other | Admitting: Internal Medicine

## 2023-10-28 ENCOUNTER — Ambulatory Visit

## 2023-10-31 DIAGNOSIS — F321 Major depressive disorder, single episode, moderate: Secondary | ICD-10-CM | POA: Diagnosis not present

## 2023-10-31 DIAGNOSIS — I10 Essential (primary) hypertension: Secondary | ICD-10-CM | POA: Diagnosis not present

## 2023-10-31 DIAGNOSIS — E7800 Pure hypercholesterolemia, unspecified: Secondary | ICD-10-CM | POA: Diagnosis not present

## 2023-10-31 DIAGNOSIS — F320 Major depressive disorder, single episode, mild: Secondary | ICD-10-CM | POA: Diagnosis not present

## 2023-11-03 ENCOUNTER — Ambulatory Visit

## 2023-11-03 ENCOUNTER — Telehealth: Payer: Self-pay

## 2023-11-03 DIAGNOSIS — M353 Polymyalgia rheumatica: Secondary | ICD-10-CM

## 2023-11-03 NOTE — Telephone Encounter (Signed)
 Patient contacted the office and states he is having major PMR flares. Patient states his right hand, right wrist, right forearm, and right shoulder have been falling asleep and then coming back very painfully. Patient states this has been going on for three days. Patient states it is worse at night and he cannot sleep well. Patient states he has been off prednisone  and believes her should go back on it. Patient states he does not remember how Dr. Jeannetta wanted him to restart the prednisone . Patient states he thought it was like a 5 ,then 4, then 3, then 2, then 1 kind of thing but he does not know. Patient states he is a chronic pain patient with his back and he takes hydrocodone  and that has not even helped. Patient states this is bad and he is in a lot of pain. Please advise.

## 2023-11-04 ENCOUNTER — Ambulatory Visit: Attending: Diagnostic Neuroimaging

## 2023-11-04 DIAGNOSIS — M546 Pain in thoracic spine: Secondary | ICD-10-CM | POA: Diagnosis not present

## 2023-11-04 DIAGNOSIS — R2689 Other abnormalities of gait and mobility: Secondary | ICD-10-CM | POA: Insufficient documentation

## 2023-11-04 DIAGNOSIS — M6281 Muscle weakness (generalized): Secondary | ICD-10-CM | POA: Insufficient documentation
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
## 2023-11-05 MED ORDER — PREDNISONE 5 MG PO TABS: ORAL_TABLET | ORAL | 0 refills | Status: AC

## 2023-11-05 NOTE — Telephone Encounter (Signed)
 Contacted the patient to advise Dr. Jeannetta sent a new prescription for prednisone  starting with an initial taper with 4 tablets then 3 tablets then 2 tablets each for 5 days.  Then he could go back to taking the 5 mg just once daily.  If symptoms do get all the way back to normal he could try discontinuing it again otherwise if needed just continue on 5 mg until we follow-up. Patient verbalized understanding.

## 2023-11-05 NOTE — Addendum Note (Signed)
 Addended by: JEANNETTA LONNI ORN on: 11/05/2023 03:12 PM   Modules accepted: Orders

## 2023-11-05 NOTE — Telephone Encounter (Signed)
 I sent a new prescription for prednisone  starting with an initial taper with 4 tablets then 3 tablets then 2 tablets each for 5 days.  Then he could go back to taking the 5 mg just once daily.  If symptoms do get all the way back to normal he could try discontinuing it again otherwise if needed just continue on 5 mg until we follow-up.

## 2023-11-09 ENCOUNTER — Ambulatory Visit

## 2023-11-09 DIAGNOSIS — R2689 Other abnormalities of gait and mobility: Secondary | ICD-10-CM | POA: Diagnosis not present

## 2023-11-09 DIAGNOSIS — M6281 Muscle weakness (generalized): Secondary | ICD-10-CM

## 2023-11-09 DIAGNOSIS — M546 Pain in thoracic spine: Secondary | ICD-10-CM | POA: Diagnosis not present
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
## 2023-11-11 ENCOUNTER — Ambulatory Visit

## 2023-11-29 DIAGNOSIS — M5416 Radiculopathy, lumbar region: Secondary | ICD-10-CM | POA: Diagnosis not present

## 2023-11-30 DIAGNOSIS — F320 Major depressive disorder, single episode, mild: Secondary | ICD-10-CM | POA: Diagnosis not present

## 2023-11-30 DIAGNOSIS — E7800 Pure hypercholesterolemia, unspecified: Secondary | ICD-10-CM | POA: Diagnosis not present

## 2023-11-30 DIAGNOSIS — F321 Major depressive disorder, single episode, moderate: Secondary | ICD-10-CM | POA: Diagnosis not present

## 2023-12-16 DIAGNOSIS — M353 Polymyalgia rheumatica: Secondary | ICD-10-CM | POA: Diagnosis not present

## 2023-12-16 DIAGNOSIS — E782 Mixed hyperlipidemia: Secondary | ICD-10-CM | POA: Diagnosis not present

## 2023-12-16 DIAGNOSIS — I10 Essential (primary) hypertension: Secondary | ICD-10-CM | POA: Diagnosis not present

## 2023-12-16 DIAGNOSIS — G8929 Other chronic pain: Secondary | ICD-10-CM | POA: Diagnosis not present

## 2023-12-31 DIAGNOSIS — F320 Major depressive disorder, single episode, mild: Secondary | ICD-10-CM | POA: Diagnosis not present

## 2023-12-31 DIAGNOSIS — F321 Major depressive disorder, single episode, moderate: Secondary | ICD-10-CM | POA: Diagnosis not present

## 2023-12-31 DIAGNOSIS — E7800 Pure hypercholesterolemia, unspecified: Secondary | ICD-10-CM | POA: Diagnosis not present

## 2023-12-31 DIAGNOSIS — I10 Essential (primary) hypertension: Secondary | ICD-10-CM | POA: Diagnosis not present

## 2024-01-11 DIAGNOSIS — I10 Essential (primary) hypertension: Secondary | ICD-10-CM | POA: Diagnosis not present

## 2024-01-30 DIAGNOSIS — F321 Major depressive disorder, single episode, moderate: Secondary | ICD-10-CM | POA: Diagnosis not present

## 2024-01-30 DIAGNOSIS — E7800 Pure hypercholesterolemia, unspecified: Secondary | ICD-10-CM | POA: Diagnosis not present

## 2024-01-30 DIAGNOSIS — F320 Major depressive disorder, single episode, mild: Secondary | ICD-10-CM | POA: Diagnosis not present

## 2024-01-30 DIAGNOSIS — I10 Essential (primary) hypertension: Secondary | ICD-10-CM | POA: Diagnosis not present

## 2024-02-04 ENCOUNTER — Telehealth: Payer: Self-pay | Admitting: *Deleted

## 2024-02-04 DIAGNOSIS — M353 Polymyalgia rheumatica: Secondary | ICD-10-CM

## 2024-02-04 MED ORDER — PREDNISONE 5 MG PO TABS: ORAL_TABLET | ORAL | 0 refills | Status: DC

## 2024-02-04 NOTE — Telephone Encounter (Signed)
 Sent Rx for prednisone  to pharmacy. He can let us  know if not improving as expected. Otherwise we can recheck symptoms at f/u next month.

## 2024-02-04 NOTE — Addendum Note (Signed)
 Addended by: JEANNETTA LONNI ORN on: 02/04/2024 09:36 AM   Modules accepted: Orders

## 2024-02-04 NOTE — Telephone Encounter (Signed)
 Patient advised Sent Rx for prednisone  to pharmacy. He can let us  know if not improving as expected. Otherwise we can recheck symptoms at f/u next month.

## 2024-02-04 NOTE — Telephone Encounter (Signed)
 Patient states he is having a flare on his PMR. Patient states he needs a refill of Prednisone . Patient would like a refill sent to the pharmacy.
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
## 2024-03-17 ENCOUNTER — Ambulatory Visit

## 2024-03-17 ENCOUNTER — Encounter: Payer: Self-pay | Admitting: Internal Medicine

## 2024-03-17 ENCOUNTER — Ambulatory Visit: Admitting: Internal Medicine

## 2024-03-17 VITALS — BP 160/86 | HR 78 | Temp 98.6°F | Resp 16 | Ht 67.0 in | Wt 164.8 lb

## 2024-03-17 DIAGNOSIS — M545 Low back pain, unspecified: Secondary | ICD-10-CM

## 2024-03-17 DIAGNOSIS — M353 Polymyalgia rheumatica: Secondary | ICD-10-CM | POA: Diagnosis not present

## 2024-03-17 DIAGNOSIS — Z7952 Long term (current) use of systemic steroids: Secondary | ICD-10-CM

## 2024-03-17 MED ORDER — PREDNISONE 5 MG PO TABS: 5.0000 mg | ORAL_TABLET | Freq: Every day | ORAL | 1 refills | Status: AC | PRN

## 2024-08-15 ENCOUNTER — Ambulatory Visit: Admitting: Internal Medicine
